# Patient Record
Sex: Male | Born: 1980 | Race: White | Hispanic: No | Marital: Married | Smoking: Current every day smoker
Health system: Southern US, Community
[De-identification: ages and names within clinical notes are randomized; demographics above are authoritative.]

## PROBLEM LIST (undated history)

## (undated) DIAGNOSIS — I2510 Atherosclerotic heart disease of native coronary artery without angina pectoris: Secondary | ICD-10-CM

## (undated) DIAGNOSIS — I4891 Unspecified atrial fibrillation: Secondary | ICD-10-CM

## (undated) DIAGNOSIS — I10 Essential (primary) hypertension: Secondary | ICD-10-CM

## (undated) DIAGNOSIS — Z720 Tobacco use: Secondary | ICD-10-CM

## (undated) DIAGNOSIS — R569 Unspecified convulsions: Secondary | ICD-10-CM

## (undated) HISTORY — PX: FOREARM SURGERY: SHX651

## (undated) HISTORY — DX: Atherosclerotic heart disease of native coronary artery without angina pectoris: I25.10

## (undated) HISTORY — PX: KNEE SURGERY: SHX244

## (undated) HISTORY — PX: EYE SURGERY: SHX253

## (undated) HISTORY — PX: CARDIAC SURGERY: SHX584

## (undated) HISTORY — PX: OTHER SURGICAL HISTORY: SHX169

---

## 2004-07-03 ENCOUNTER — Emergency Department: Payer: Self-pay | Admitting: Emergency Medicine

## 2004-08-11 ENCOUNTER — Emergency Department: Payer: Self-pay | Admitting: Emergency Medicine

## 2004-11-17 ENCOUNTER — Emergency Department: Payer: Self-pay | Admitting: Emergency Medicine

## 2004-12-19 ENCOUNTER — Emergency Department: Payer: Self-pay | Admitting: General Practice

## 2004-12-22 ENCOUNTER — Emergency Department: Payer: Self-pay | Admitting: Emergency Medicine

## 2004-12-29 ENCOUNTER — Emergency Department: Payer: Self-pay | Admitting: Emergency Medicine

## 2005-05-16 ENCOUNTER — Emergency Department: Payer: Self-pay | Admitting: Emergency Medicine

## 2005-05-17 ENCOUNTER — Other Ambulatory Visit: Payer: Self-pay

## 2006-10-23 ENCOUNTER — Other Ambulatory Visit: Payer: Self-pay

## 2006-10-23 ENCOUNTER — Emergency Department: Payer: Self-pay | Admitting: Emergency Medicine

## 2006-12-26 ENCOUNTER — Emergency Department: Payer: Self-pay | Admitting: Emergency Medicine

## 2007-03-19 ENCOUNTER — Emergency Department: Payer: Self-pay | Admitting: Emergency Medicine

## 2007-11-23 ENCOUNTER — Emergency Department: Payer: Self-pay | Admitting: Emergency Medicine

## 2008-03-26 ENCOUNTER — Emergency Department: Payer: Self-pay | Admitting: Emergency Medicine

## 2008-04-16 ENCOUNTER — Emergency Department: Payer: Self-pay | Admitting: Emergency Medicine

## 2008-11-01 ENCOUNTER — Emergency Department: Payer: Self-pay | Admitting: Emergency Medicine

## 2009-03-05 ENCOUNTER — Emergency Department: Payer: Self-pay | Admitting: Internal Medicine

## 2009-03-14 ENCOUNTER — Emergency Department: Payer: Self-pay | Admitting: Emergency Medicine

## 2009-04-26 ENCOUNTER — Emergency Department: Payer: Self-pay | Admitting: Emergency Medicine

## 2009-06-04 ENCOUNTER — Emergency Department: Payer: Self-pay | Admitting: Emergency Medicine

## 2009-07-18 ENCOUNTER — Emergency Department: Payer: Self-pay | Admitting: Emergency Medicine

## 2009-12-23 ENCOUNTER — Inpatient Hospital Stay: Payer: Self-pay | Admitting: Psychiatry

## 2010-03-20 ENCOUNTER — Emergency Department: Payer: Self-pay | Admitting: Internal Medicine

## 2010-03-30 ENCOUNTER — Emergency Department: Payer: Self-pay | Admitting: Emergency Medicine

## 2010-04-07 ENCOUNTER — Emergency Department: Payer: Self-pay | Admitting: Emergency Medicine

## 2010-05-08 ENCOUNTER — Emergency Department: Payer: Self-pay | Admitting: Emergency Medicine

## 2010-05-18 ENCOUNTER — Emergency Department: Payer: Self-pay | Admitting: Emergency Medicine

## 2010-07-05 DIAGNOSIS — F319 Bipolar disorder, unspecified: Secondary | ICD-10-CM | POA: Diagnosis present

## 2010-09-13 ENCOUNTER — Emergency Department: Payer: Self-pay | Admitting: Emergency Medicine

## 2010-09-19 ENCOUNTER — Emergency Department: Payer: Self-pay | Admitting: Emergency Medicine

## 2011-01-19 ENCOUNTER — Emergency Department: Payer: Self-pay | Admitting: Emergency Medicine

## 2011-02-11 ENCOUNTER — Emergency Department: Payer: Self-pay | Admitting: Emergency Medicine

## 2011-03-15 ENCOUNTER — Emergency Department: Payer: Self-pay | Admitting: Unknown Physician Specialty

## 2012-01-22 ENCOUNTER — Emergency Department: Payer: Self-pay | Admitting: Unknown Physician Specialty

## 2012-02-25 ENCOUNTER — Emergency Department: Payer: Self-pay | Admitting: Unknown Physician Specialty

## 2012-02-25 LAB — CBC
HCT: 47.9 % (ref 40.0–52.0)
MCH: 31.5 pg (ref 26.0–34.0)
MCHC: 33.9 g/dL (ref 32.0–36.0)
RDW: 13 % (ref 11.5–14.5)
WBC: 11.8 10*3/uL — ABNORMAL HIGH (ref 3.8–10.6)

## 2012-02-25 LAB — DRUG SCREEN, URINE
Amphetamines, Ur Screen: NEGATIVE (ref ?–1000)
Benzodiazepine, Ur Scrn: NEGATIVE (ref ?–200)
Cannabinoid 50 Ng, Ur ~~LOC~~: NEGATIVE (ref ?–50)
MDMA (Ecstasy)Ur Screen: NEGATIVE (ref ?–500)
Methadone, Ur Screen: NEGATIVE (ref ?–300)
Tricyclic, Ur Screen: NEGATIVE (ref ?–1000)

## 2012-02-25 LAB — COMPREHENSIVE METABOLIC PANEL
Albumin: 4.1 g/dL (ref 3.4–5.0)
Alkaline Phosphatase: 102 U/L (ref 50–136)
BUN: 9 mg/dL (ref 7–18)
Bilirubin,Total: 0.2 mg/dL (ref 0.2–1.0)
Calcium, Total: 8.7 mg/dL (ref 8.5–10.1)
Creatinine: 0.97 mg/dL (ref 0.60–1.30)
Glucose: 95 mg/dL (ref 65–99)
SGOT(AST): 30 U/L (ref 15–37)

## 2012-02-25 LAB — CK TOTAL AND CKMB (NOT AT ARMC)
CK, Total: 121 U/L (ref 35–232)
CK-MB: <0.5 ng/mL — ABNORMAL LOW (ref 0.5–3.6)

## 2012-02-25 LAB — URINALYSIS, COMPLETE
Bacteria: NONE SEEN
Bilirubin,UR: NEGATIVE
Blood: NEGATIVE
Glucose,UR: NEGATIVE mg/dL (ref 0–75)
Leukocyte Esterase: NEGATIVE
Ph: 7 (ref 4.5–8.0)
Protein: NEGATIVE
Specific Gravity: 1.001 (ref 1.003–1.030)
Squamous Epithelial: NONE SEEN

## 2012-02-25 LAB — ETHANOL
Ethanol %: <0.003 % (ref 0.000–0.080)
Ethanol: <3 mg/dL

## 2012-03-26 DIAGNOSIS — E785 Hyperlipidemia, unspecified: Secondary | ICD-10-CM | POA: Diagnosis present

## 2012-04-14 ENCOUNTER — Emergency Department: Payer: Self-pay | Admitting: Unknown Physician Specialty

## 2012-06-05 ENCOUNTER — Emergency Department: Payer: Self-pay | Admitting: Emergency Medicine

## 2012-08-15 ENCOUNTER — Emergency Department: Payer: Self-pay | Admitting: Emergency Medicine

## 2013-01-28 ENCOUNTER — Emergency Department: Payer: Self-pay | Admitting: Emergency Medicine

## 2013-01-28 LAB — URINALYSIS, COMPLETE
Bilirubin,UR: NEGATIVE
Ketone: NEGATIVE
Leukocyte Esterase: NEGATIVE
Nitrite: NEGATIVE
Protein: NEGATIVE
RBC,UR: NONE SEEN /HPF (ref 0–5)
Specific Gravity: 1.023 (ref 1.003–1.030)
WBC UR: <1 /HPF (ref 0–5)

## 2013-01-28 LAB — COMPREHENSIVE METABOLIC PANEL
BUN: 13 mg/dL (ref 7–18)
Calcium, Total: 8.9 mg/dL (ref 8.5–10.1)
Co2: 32 mmol/L (ref 21–32)
EGFR (African American): >60
EGFR (Non-African Amer.): >60
Glucose: 90 mg/dL (ref 65–99)
Potassium: 3.9 mmol/L (ref 3.5–5.1)
SGOT(AST): 27 U/L (ref 15–37)
SGPT (ALT): 37 U/L (ref 12–78)
Total Protein: 7.6 g/dL (ref 6.4–8.2)

## 2013-01-28 LAB — CBC
HGB: 16.1 g/dL (ref 13.0–18.0)
MCHC: 34.5 g/dL (ref 32.0–36.0)
MCV: 90 fL (ref 80–100)
Platelet: 181 10*3/uL (ref 150–440)
RBC: 5.16 10*6/uL (ref 4.40–5.90)
RDW: 13 % (ref 11.5–14.5)
WBC: 13.4 10*3/uL — ABNORMAL HIGH (ref 3.8–10.6)

## 2013-01-28 LAB — APTT: Activated PTT: 36.8 secs — ABNORMAL HIGH (ref 23.6–35.9)

## 2013-01-28 LAB — PROTIME-INR
INR: 0.9
Prothrombin Time: 12.3 secs (ref 11.5–14.7)

## 2013-08-31 ENCOUNTER — Emergency Department: Payer: Self-pay | Admitting: Emergency Medicine

## 2013-12-31 ENCOUNTER — Emergency Department: Payer: Self-pay | Admitting: Internal Medicine

## 2014-05-04 ENCOUNTER — Emergency Department: Payer: Self-pay | Admitting: Emergency Medicine

## 2014-09-07 ENCOUNTER — Emergency Department: Payer: Self-pay | Admitting: Emergency Medicine

## 2014-10-19 ENCOUNTER — Emergency Department: Payer: Self-pay | Admitting: Emergency Medicine

## 2015-06-21 DIAGNOSIS — F17200 Nicotine dependence, unspecified, uncomplicated: Secondary | ICD-10-CM | POA: Diagnosis present

## 2015-10-13 ENCOUNTER — Emergency Department: Payer: Self-pay

## 2015-10-13 ENCOUNTER — Emergency Department
Admission: EM | Admit: 2015-10-13 | Discharge: 2015-10-13 | Disposition: A | Discharge status: Home / Self Care | Payer: Self-pay | Attending: Emergency Medicine | Admitting: Emergency Medicine

## 2015-10-13 ENCOUNTER — Encounter: Payer: Self-pay | Admitting: Emergency Medicine

## 2015-10-13 DIAGNOSIS — F17200 Nicotine dependence, unspecified, uncomplicated: Secondary | ICD-10-CM | POA: Insufficient documentation

## 2015-10-13 DIAGNOSIS — M25562 Pain in left knee: Secondary | ICD-10-CM | POA: Insufficient documentation

## 2015-10-13 DIAGNOSIS — Z9889 Other specified postprocedural states: Secondary | ICD-10-CM | POA: Insufficient documentation

## 2015-10-13 DIAGNOSIS — I10 Essential (primary) hypertension: Secondary | ICD-10-CM | POA: Insufficient documentation

## 2015-10-13 HISTORY — DX: Essential (primary) hypertension: I10

## 2015-10-13 HISTORY — DX: Unspecified convulsions: R56.9

## 2015-10-13 MED ORDER — TRAMADOL HCL 50 MG PO TABS: 50.0000 mg | ORAL_TABLET | Freq: Four times a day (QID) | ORAL | Status: DC | PRN

## 2015-10-13 MED ORDER — TRAMADOL HCL 50 MG PO TABS
50.0000 mg | ORAL_TABLET | Freq: Once | ORAL | Status: AC
  Administered 2015-10-13: 50 mg via ORAL
  Filled 2015-10-13: qty 1

## 2015-10-13 MED ORDER — ACETAMINOPHEN 325 MG PO TABS
650.0000 mg | ORAL_TABLET | Freq: Once | ORAL | Status: AC
  Administered 2015-10-13: 650 mg via ORAL
  Filled 2015-10-13: qty 2

## 2015-10-13 NOTE — ED Provider Notes (Signed)
[facility] Emergency Department Provider Note  ____________________________________________  Time seen: Approximately 559 AM  I have reviewed the triage vital signs and the nursing notes.   HISTORY  Chief Complaint Knee Pain    HPI Jeremy Yoder is a 35 y.o. male who comes into the hospital today with left knee pain. The patient reports that the pain started 4 days ago. The patient does not recall any trauma but is unsure if he may have stepped off the porch wrong was playing with his kids. The patient is been propping up his knee but he has not iced it and he has not taken any medications for his knee. The patient reports that it still hurts and he rates pain 8 out of 10 in intensity. The patient does not have a physician a follow-up with. He reports that had been swollen previously but it is not swollen currently. He does have a history of surgery on that knee and reports this pain is in the left medial knee. The patient is here for evaluation and treatment of his symptoms.   Past Medical History  Diagnosis Date  . Hypertension   . Seizures (HCC)   . Hyperlipidemia     There are no active problems to display for this patient.   Past Surgical History  Procedure Laterality Date  . Arm    . Knee surgery    . Forearm surgery      Current Outpatient Rx  Name  Route  Sig  Dispense  Refill  . traMADol (ULTRAM) 50 MG tablet   Oral   Take 1 tablet (50 mg total) by mouth every 6 (six) hours as needed.   12 tablet   0     Allergies Aspirin and Ibuprofen  No family history on file.  Social History Social History  Substance Use Topics  . Smoking status: Current Every Day Smoker  . Smokeless tobacco: Not on file  . Alcohol Use: No    Review of Systems Constitutional: No fever/chills Eyes: No visual changes. ENT: No sore throat. Cardiovascular: Denies chest pain. Respiratory: Denies shortness of breath. Gastrointestinal: No abdominal pain.   No nausea, no vomiting.  No diarrhea.  No constipation. Genitourinary: Negative for dysuria. Musculoskeletal: Left Knee pain Skin: Negative for rash. Neurological: Negative for headaches, focal weakness or numbness.  10-point ROS otherwise negative.  ____________________________________________   PHYSICAL EXAM:  VITAL SIGNS: ED Triage Vitals  Enc Vitals Group     BP 10/13/15 0217 159/102 mmHg     Pulse Rate 10/13/15 0217 95     Resp 10/13/15 0217 18     Temp 10/13/15 0217 97.6 F (36.4 C)     Temp Source 10/13/15 0217 Oral     SpO2 10/13/15 0217 99 %     Weight 10/13/15 0217 185 lb (83.915 kg)     Height 10/13/15 0217 6' (1.829 m)     Head Cir --      Peak Flow --      Pain Score 10/13/15 0218 8     Pain Loc --      Pain Edu? --      Excl. in GC? --     Constitutional: Alert and oriented. Well appearing and in mild distress. Eyes: Conjunctivae are normal. PERRL. EOMI. Head: Atraumatic. Nose: No congestion/rhinnorhea. Mouth/Throat: Mucous membranes are moist.  Oropharynx non-erythematous. Cardiovascular: Normal rate, regular rhythm. Grossly normal heart sounds.  Good peripheral circulation. Respiratory: Normal respiratory effort.  No retractions.  Lungs CTAB. Gastrointestinal: Soft and nontender. No distention. Positive bowel sounds Musculoskeletal: No lower extremity tenderness nor edema. Mild tenderness to palpation of left medial knee with mild pain to flexion and extension. No significant effusion. Neurologic:  Normal speech and language.  Skin:  Skin is warm, dry and intact.  Psychiatric: Mood and affect are normal.   ____________________________________________   LABS (all labs ordered are listed, but only abnormal results are displayed)  Labs Reviewed - No data to display ____________________________________________  EKG  None ____________________________________________  RADIOLOGY  Left knee complete: No evidence for fracture or  dislocation ____________________________________________   PROCEDURES  Procedure(s) performed: None  Critical Care performed: No  ____________________________________________   INITIAL IMPRESSION / ASSESSMENT AND PLAN / ED COURSE  Pertinent labs & imaging results that were available during my care of the patient were reviewed by me and considered in my medical decision making (see chart for details).  The patient has some minimal tenderness to palpation. I informed him that he may have a soft tissue injury that we cannot see on x-ray. I will give him some Tylenol and tramadol as well as put him in a knee immobilizer and give him some crutches. I will have the patient follow up with orthopedic surgery for further evaluation of his knee pain. The patient was able to ambulate into the emergency department. I will encourage nonweightbearing with ice and rest as well as some pain medicine. The patient be discharged to follow-up. ____________________________________________   FINAL CLINICAL IMPRESSION(S) / ED DIAGNOSES  Final diagnoses:  Left knee pain      Rebecka Apley, MD 10/13/15 [phone]

## 2015-10-13 NOTE — ED Notes (Signed)
Pt in with co left knee pain hx of surgery to same in 2002 and has intermittent pain since.

## 2015-10-13 NOTE — Discharge Instructions (Signed)

## 2016-03-19 ENCOUNTER — Emergency Department: Payer: Self-pay

## 2016-03-19 ENCOUNTER — Emergency Department
Admission: EM | Admit: 2016-03-19 | Discharge: 2016-03-20 | Disposition: A | Discharge status: Home / Self Care | Payer: Self-pay | Attending: Emergency Medicine | Admitting: Emergency Medicine

## 2016-03-19 DIAGNOSIS — I10 Essential (primary) hypertension: Secondary | ICD-10-CM | POA: Insufficient documentation

## 2016-03-19 DIAGNOSIS — M545 Low back pain, unspecified: Secondary | ICD-10-CM

## 2016-03-19 DIAGNOSIS — F17200 Nicotine dependence, unspecified, uncomplicated: Secondary | ICD-10-CM | POA: Insufficient documentation

## 2016-03-19 DIAGNOSIS — J209 Acute bronchitis, unspecified: Secondary | ICD-10-CM | POA: Insufficient documentation

## 2016-03-19 LAB — CBC WITH DIFFERENTIAL/PLATELET
Basophils Absolute: 0.1 10*3/uL (ref 0–0.1)
Basophils Relative: 1 %
Eosinophils Absolute: 0.7 10*3/uL (ref 0–0.7)
Eosinophils Relative: 6 %
HEMATOCRIT: 49.8 % (ref 40.0–52.0)
HEMOGLOBIN: 17.3 g/dL (ref 13.0–18.0)
LYMPHS ABS: 4.1 10*3/uL — AB (ref 1.0–3.6)
LYMPHS PCT: 37 %
MCH: 31 pg (ref 26.0–34.0)
MCHC: 34.7 g/dL (ref 32.0–36.0)
MCV: 89.2 fL (ref 80.0–100.0)
MONOS PCT: 9 %
Monocytes Absolute: 1 10*3/uL (ref 0.2–1.0)
NEUTROS ABS: 5.3 10*3/uL (ref 1.4–6.5)
NEUTROS PCT: 47 %
Platelets: 181 10*3/uL (ref 150–440)
RBC: 5.59 MIL/uL (ref 4.40–5.90)
RDW: 13.1 % (ref 11.5–14.5)
WBC: 11.1 10*3/uL — AB (ref 3.8–10.6)

## 2016-03-19 LAB — COMPREHENSIVE METABOLIC PANEL WITH GFR
ALT: 23 U/L (ref 17–63)
AST: 26 U/L (ref 15–41)
Albumin: 4.5 g/dL (ref 3.5–5.0)
Alkaline Phosphatase: 72 U/L (ref 38–126)
Anion gap: 6 (ref 5–15)
BUN: 12 mg/dL (ref 6–20)
CO2: 27 mmol/L (ref 22–32)
Calcium: 9.2 mg/dL (ref 8.9–10.3)
Chloride: 106 mmol/L (ref 101–111)
Creatinine, Ser: 1.01 mg/dL (ref 0.61–1.24)
GFR calc Af Amer: >60 mL/min
GFR calc non Af Amer: >60 mL/min
Glucose, Bld: 92 mg/dL (ref 65–99)
Potassium: 3.7 mmol/L (ref 3.5–5.1)
Sodium: 139 mmol/L (ref 135–145)
Total Bilirubin: 0.5 mg/dL (ref 0.3–1.2)
Total Protein: 7.9 g/dL (ref 6.5–8.1)

## 2016-03-19 LAB — TROPONIN I: Troponin I: <0.03 ng/mL (ref <0.03)

## 2016-03-19 MED ORDER — HYDROCODONE-ACETAMINOPHEN 5-325 MG PO TABS
1.0000 | ORAL_TABLET | Freq: Once | ORAL | Status: AC
Start: 2016-03-19 — End: 2016-03-19
  Administered 2016-03-19: 1 via ORAL
  Filled 2016-03-19: qty 1

## 2016-03-19 MED ORDER — ALBUTEROL SULFATE (2.5 MG/3ML) 0.083% IN NEBU
5.0000 mg | INHALATION_SOLUTION | Freq: Once | RESPIRATORY_TRACT | Status: AC
  Administered 2016-03-19: 5 mg via RESPIRATORY_TRACT
  Filled 2016-03-19: qty 6

## 2016-03-19 NOTE — ED Triage Notes (Signed)
Pt ambulatory to triage with no difficulty. Pt reports he developed pain to his back last night and has been having shortness of breath when he walks. Pt states he has just not been feeling good.

## 2016-03-19 NOTE — ED Provider Notes (Signed)
Time Seen: Approximately 2317  I have reviewed the triage notes  Chief Complaint: Back Pain and Shortness of Breath   History of Present Illness: Jeremy Yoder is a 35 y.o. male who has a history significant for heavy tobacco usage and states he's had some difficulty breathing over the last couple of days with a occasional productive cough. He is not sure of the color is fine but he is not aware of any fever at home. Patient was found on triage evaluation have significant wheezing and was given albuterol Atrovent treatment which she states has helped. His second complaint is some lower back pain which is been going on for the last several days but significantly more last night. He has been able to ambulate describes normal urination and bowel movements. He states he has some pain when he lifts his left leg but is not noticed any pain radiating down the back of his leg. Taken anything at home for pain describe significant allergies to aspirin and ibuprofen.   Past Medical History:  Diagnosis Date  . Hyperlipidemia   . Hypertension   . Seizures (HCC)     There are no active problems to display for this patient.   Past Surgical History:  Procedure Laterality Date  . arm    . FOREARM SURGERY    . KNEE SURGERY      Past Surgical History:  Procedure Laterality Date  . arm    . FOREARM SURGERY    . KNEE SURGERY      Current Outpatient Rx  . Order #: [id number] Class: Print    Allergies:  Aspirin and Ibuprofen  Family History: No family history on file.  Social History: Social History  Substance Use Topics  . Smoking status: Current Every Day Smoker  . Smokeless tobacco: Not on file  . Alcohol use No     Review of Systems:   10 point review of systems was performed and was otherwise negative:  Constitutional: No fever Eyes: No visual disturbances ENT: No sore throat, ear pain Cardiac: No chest pain Respiratory: Shortness of breath mainly with ambulation.  Occasional cough Abdomen: No abdominal pain, no vomiting, No diarrhea Endocrine: No weight loss, No night sweats Extremities: No peripheral edema, cyanosis Skin: No rashes, easy bruising Neurologic: No focal weakness, trouble with speech or swollowing Urologic: No dysuria, Hematuria, or urinary frequency Outside smoking denies any recent pulmonary emboli risk factors  Physical Exam:  ED Triage Vitals  Enc Vitals Group     BP 03/19/16 2208 (!) 162/116     Pulse Rate 03/19/16 2208 (!) 111     Resp 03/19/16 2208 (!) 22     Temp 03/19/16 2208 98.2 F (36.8 C)     Temp Source 03/19/16 2208 Oral     SpO2 03/19/16 2208 94 %     Weight 03/19/16 2208 180 lb (81.6 kg)     Height 03/19/16 2208  (1.803 m)     Head Circumference --      Peak Flow --      Pain Score 03/19/16 2211 10     Pain Loc --      Pain Edu? --      Excl. in GC? --     General: Awake , Alert , and Oriented times 3; GCS 15 No signs of respiratory distress Head: Normal cephalic , atraumatic Eyes: Pupils equal , round, reactive to light Nose/Throat: No nasal drainage, patent upper airway without erythema or exudate.  Neck: Supple, Full range of motion, No anterior adenopathy or palpable thyroid masses Lungs: Clear to ascultation without wheezes , mild rhonchi at the left apices without rales Heart: Regular rate, regular rhythm without murmurs , gallops , or rubs Abdomen: Soft, non tender without rebound, guarding , or rigidity; bowel sounds positive and symmetric in all 4 quadrants. No organomegaly .        Extremities: 2 plus symmetric pulses. No edema, clubbing or cyanosis. No calf tenderness or swelling Neurologic: normal ambulation, Motor symmetric without deficits, sensory intact Skin: warm, dry, no rashes   Labs:   All laboratory work was reviewed including any pertinent negatives or positives listed below:  Labs Reviewed  CBC WITH DIFFERENTIAL/PLATELET  COMPREHENSIVE METABOLIC PANEL  TROPONIN I   FIBRIN DERIVATIVES D-DIMER ([facility] ONLY)    EKG:  ED ECG REPORT I, Jennye MoccasinBrian S Zalmen Wrightsman, the attending physician, personally viewed and interpreted this ECG.  Date: 03/19/2016 EKG Time: 2215 Rate: *78 Rhythm: normal sinus rhythm QRS Axis: normal Intervals: normal ST/T Wave abnormalities: normal Conduction Disturbances: none Narrative Interpretation: unremarkable No acute ischemic changes   Radiology: *  EXAM: CHEST  2 VIEW  COMPARISON:  Radiograph 09/07/2014  FINDINGS: The cardiomediastinal contours are normal. Mild bronchial thickening. Pulmonary vasculature is normal. No consolidation, pleural effusion, or pneumothorax. No acute osseous abnormalities are seen.  IMPRESSION: Mild bronchial thickening may be smoking related lung disease or acute bronchitis. Otherwise no acute abnormality.    ED Course: * Patient's stay here was uneventful. He had a single breathing treatment which cleared most of his wheezing and repeat exam only shows some mild rhonchi in the left upper lobe. Patient's not hypoxic. D-dimer test was negative and I felt further investigation of life-threatening causes for shortness of breath such as pulmonary embolism, congestive heart failure, cardiac tamponade not etc. was not necessary. He has an extensive smoking history and is most likely is acute bronchitis with bronchospasm. Patient's low back pain does not appear to be cauda equina syndrome.   Clinical Course     Assessment:  Acute bronchitis with bronchospasm Musculoskeletal low back pain     Plan:  Outpatient Patient was advised to return immediately if condition worsens. Patient was advised to follow up with their primary care physician or other specialized physicians involved in their outpatient care. The patient and/or family member/power of attorney had laboratory results reviewed at the bedside. All questions and concerns were addressed and appropriate discharge instructions were  distributed by the nursing staff.             Jennye MoccasinBrian S Essex Perry, MD 03/20/16 [id number]

## 2016-03-20 LAB — FIBRIN DERIVATIVES D-DIMER (ARMC ONLY): FIBRIN DERIVATIVES D-DIMER (ARMC): 266 (ref 0–499)

## 2016-03-20 MED ORDER — AZITHROMYCIN 250 MG PO TABS: ORAL_TABLET | ORAL | 0 refills | Status: DC

## 2016-03-20 MED ORDER — ALBUTEROL SULFATE HFA 108 (90 BASE) MCG/ACT IN AERS: 2.0000 | INHALATION_SPRAY | Freq: Four times a day (QID) | RESPIRATORY_TRACT | 2 refills | Status: DC | PRN

## 2016-03-20 MED ORDER — TRAMADOL HCL 50 MG PO TABS: 50.0000 mg | ORAL_TABLET | Freq: Four times a day (QID) | ORAL | 0 refills | Status: AC | PRN

## 2016-03-20 MED ORDER — LISINOPRIL 10 MG PO TABS: 10.0000 mg | ORAL_TABLET | Freq: Every day | ORAL | 0 refills | Status: DC

## 2016-07-13 ENCOUNTER — Encounter: Payer: Self-pay | Admitting: Emergency Medicine

## 2016-07-13 ENCOUNTER — Emergency Department
Admission: EM | Admit: 2016-07-13 | Discharge: 2016-07-13 | Disposition: A | Discharge status: Home / Self Care | Payer: Self-pay | Attending: Emergency Medicine | Admitting: Emergency Medicine

## 2016-07-13 DIAGNOSIS — R591 Generalized enlarged lymph nodes: Secondary | ICD-10-CM | POA: Insufficient documentation

## 2016-07-13 DIAGNOSIS — I10 Essential (primary) hypertension: Secondary | ICD-10-CM | POA: Insufficient documentation

## 2016-07-13 DIAGNOSIS — K0889 Other specified disorders of teeth and supporting structures: Secondary | ICD-10-CM | POA: Insufficient documentation

## 2016-07-13 DIAGNOSIS — J069 Acute upper respiratory infection, unspecified: Secondary | ICD-10-CM | POA: Insufficient documentation

## 2016-07-13 DIAGNOSIS — F17200 Nicotine dependence, unspecified, uncomplicated: Secondary | ICD-10-CM | POA: Insufficient documentation

## 2016-07-13 LAB — POCT RAPID STREP A: Streptococcus, Group A Screen (Direct): NEGATIVE

## 2016-07-13 MED ORDER — HYDROCOD POLST-CPM POLST ER 10-8 MG/5ML PO SUER
5.0000 mL | Freq: Once | ORAL | Status: AC
  Administered 2016-07-13: 5 mL via ORAL

## 2016-07-13 MED ORDER — PREDNISONE 20 MG PO TABS
60.0000 mg | ORAL_TABLET | Freq: Once | ORAL | Status: AC
  Administered 2016-07-13: 60 mg via ORAL

## 2016-07-13 MED ORDER — HYDROCOD POLST-CPM POLST ER 10-8 MG/5ML PO SUER
ORAL | Status: AC
  Administered 2016-07-13: 5 mL via ORAL
  Filled 2016-07-13: qty 5

## 2016-07-13 MED ORDER — PREDNISONE 20 MG PO TABS
ORAL_TABLET | ORAL | Status: AC
  Administered 2016-07-13: 60 mg via ORAL
  Filled 2016-07-13: qty 3

## 2016-07-13 MED ORDER — PREDNISONE 10 MG (21) PO TBPK: ORAL_TABLET | ORAL | 0 refills | Status: DC

## 2016-07-13 MED ORDER — HYDROCOD POLST-CPM POLST ER 10-8 MG/5ML PO SUER: 5.0000 mL | Freq: Two times a day (BID) | ORAL | 0 refills | Status: DC

## 2016-07-13 NOTE — ED Triage Notes (Signed)
Pt with right side gland swelling started yesterday with a cough.

## 2016-07-13 NOTE — ED Notes (Signed)
Discharge instructions reviewed with patient. Questions fielded by this RN. Patient verbalizes understanding of instructions. Patient discharged home in stable condition per Jeremy Yoder . Pt educated on the needto follow up with PCP in regards to BP. No acute distress noted at time of discharge.

## 2016-07-13 NOTE — ED Provider Notes (Signed)
[facility] Emergency Department Provider Note        Time seen: ----------------------------------------- 7:56 PM on 07/13/2016 -----------------------------------------    I have reviewed the triage vital signs and the nursing notes.   HISTORY  Chief Complaint Lymphadenopathy    HPI Jeremy Yoder is a 35 y.o. male who presents to ER for right-sided neck swelling that started yesterday. Patient states also had a cough with some congestion but tickly has sore throat and difficulty swallowing. Patient states he could not eat lunch today due to the pain. Pain is 8 out of 10 and sharp. Patient also reports toothache in the right upper jaw.   Past Medical History:  Diagnosis Date  . Hyperlipidemia   . Hypertension   . Seizures (HCC)     There are no active problems to display for this patient.   Past Surgical History:  Procedure Laterality Date  . arm    . FOREARM SURGERY    . KNEE SURGERY      Allergies Aspirin and Ibuprofen  Social History Social History  Substance Use Topics  . Smoking status: Current Every Day Smoker  . Smokeless tobacco: Never Used  . Alcohol use No    Review of Systems Constitutional: Negative for fever. ENT: Positive for toothache, sore throat, neck swelling Cardiovascular: Negative for chest pain. Respiratory: Negative for shortness of breath. Positive for cough Gastrointestinal: Negative for abdominal pain, vomiting and diarrhea. Skin: Negative for rash. Neurological: Negative for headaches, focal weakness or numbness.  10-point ROS otherwise negative.  ____________________________________________   PHYSICAL EXAM:  VITAL SIGNS: ED Triage Vitals  Enc Vitals Group     BP 07/13/16 1819 (!) 159/110     Pulse Rate 07/13/16 1819 (!) 103     Resp 07/13/16 1819 20     Temp 07/13/16 1819 97.9 F (36.6 C)     Temp Source 07/13/16 1819 Oral     SpO2 07/13/16 1819 98 %     Weight 07/13/16 1819 180 lb  (81.6 kg)     Height 07/13/16 1819 6' (1.829 m)     Head Circumference --      Peak Flow --      Pain Score 07/13/16 1820 8     Pain Loc --      Pain Edu? --      Excl. in GC? --     Constitutional: Alert and oriented. Well appearing and in no distress. Eyes: Conjunctivae are normal. PERRL. Normal extraocular movements. ENT   Head: Normocephalic and atraumatic.   Nose: No congestion/rhinnorhea.   Mouth/Throat: Mucous membranes are moist.Posterior pharyngeal erythema, tonsils appear normal   Neck: No stridor.Moderate right anterior cervical adenopathy, mild in the left Cardiovascular: Normal rate, regular rhythm. No murmurs, rubs, or gallops. Respiratory: Normal respiratory effort without tachypnea nor retractions. Breath sounds are clear and equal bilaterally. No wheezes/rales/rhonchi. Musculoskeletal: Nontender with normal range of motion in all extremities. No lower extremity tenderness nor edema. Neurologic:  Normal speech and language. No gross focal neurologic deficits are appreciated.  Skin:  Skin is warm, dry and intact. No rash noted. ____________________________________________  ED COURSE:  Pertinent labs & imaging results that were available during my care of the patient were reviewed by me and considered in my medical decision making (see chart for details). Clinical Course   Patient presents to the ER with likely reactive adenopathy. We will obtain strep testing  Procedures ____________________________________________   LABS (pertinent positives/negatives)  Labs Reviewed  POCT  RAPID STREP A   ____________________________________________  FINAL ASSESSMENT AND PLAN  URI, lymphadenopathy  Plan: Patient with labs as dictated above. Patient is in no distress, he'll be given a steroid taper as well as medications for cough and congestion. He'll be referred to ENT if his symptoms do not improve.   Jeremy Yoder, Jeremy Buczek E, MD   Note: This dictation was  prepared with Dragon dictation. Any transcriptional errors that result from this process are unintentional    Jeremy FilbertJonathan Yoder Kerstin Crusoe, MD 07/13/16 1958

## 2016-09-27 ENCOUNTER — Encounter: Payer: Self-pay | Admitting: Emergency Medicine

## 2016-09-27 ENCOUNTER — Emergency Department
Admission: EM | Admit: 2016-09-27 | Discharge: 2016-09-27 | Disposition: A | Discharge status: Home / Self Care | Payer: Self-pay | Attending: Emergency Medicine | Admitting: Emergency Medicine

## 2016-09-27 DIAGNOSIS — Z5321 Procedure and treatment not carried out due to patient leaving prior to being seen by health care provider: Secondary | ICD-10-CM | POA: Insufficient documentation

## 2016-09-27 DIAGNOSIS — F17200 Nicotine dependence, unspecified, uncomplicated: Secondary | ICD-10-CM | POA: Insufficient documentation

## 2016-09-27 DIAGNOSIS — I10 Essential (primary) hypertension: Secondary | ICD-10-CM | POA: Insufficient documentation

## 2016-09-27 DIAGNOSIS — Z79899 Other long term (current) drug therapy: Secondary | ICD-10-CM | POA: Insufficient documentation

## 2016-09-27 DIAGNOSIS — R05 Cough: Secondary | ICD-10-CM | POA: Insufficient documentation

## 2016-09-27 NOTE — ED Triage Notes (Signed)
Patient ambulatory to triage with steady gait, without difficulty or distress noted; pt reports x 2-3 days having prod cough white sputum, sinus congestion

## 2016-10-23 ENCOUNTER — Emergency Department
Admission: EM | Admit: 2016-10-23 | Discharge: 2016-10-23 | Disposition: A | Discharge status: Home / Self Care | Payer: Self-pay | Attending: Emergency Medicine | Admitting: Emergency Medicine

## 2016-10-23 ENCOUNTER — Encounter: Payer: Self-pay | Admitting: Emergency Medicine

## 2016-10-23 DIAGNOSIS — I10 Essential (primary) hypertension: Secondary | ICD-10-CM | POA: Insufficient documentation

## 2016-10-23 DIAGNOSIS — F17200 Nicotine dependence, unspecified, uncomplicated: Secondary | ICD-10-CM | POA: Insufficient documentation

## 2016-10-23 DIAGNOSIS — K1120 Sialoadenitis, unspecified: Secondary | ICD-10-CM | POA: Insufficient documentation

## 2016-10-23 DIAGNOSIS — K029 Dental caries, unspecified: Secondary | ICD-10-CM | POA: Insufficient documentation

## 2016-10-23 MED ORDER — AMOXICILLIN 500 MG PO CAPS
1000.0000 mg | ORAL_CAPSULE | Freq: Three times a day (TID) | ORAL | Status: DC
  Administered 2016-10-23: 1000 mg via ORAL
  Filled 2016-10-23: qty 2

## 2016-10-23 MED ORDER — AMOXICILLIN 875 MG PO TABS: 875.0000 mg | ORAL_TABLET | Freq: Two times a day (BID) | ORAL | 0 refills | Status: DC

## 2016-10-23 MED ORDER — MAGIC MOUTHWASH W/LIDOCAINE: 5.0000 mL | Freq: Four times a day (QID) | ORAL | 0 refills | Status: DC

## 2016-10-23 NOTE — ED Notes (Signed)
Prescription medication coverage resource material provided to patient.

## 2016-10-23 NOTE — ED Triage Notes (Signed)
Patient to ER for c/o right sided dental pain with swollen right lymph node.

## 2016-10-23 NOTE — ED Provider Notes (Signed)
[facility] Emergency Department Provider Note  ____________________________________________  Time seen: Approximately 8:48 PM  I have reviewed the triage vital signs and the nursing notes.   HISTORY  Chief Complaint Dental Pain  .  HPI Jeremy Yoder is a 36 y.o. male who presents to emergency department complaining of right lower dental pain and swollen lymph node. Patient reports that over the past 2-3 days he has had increasing right lower dental pain. He reports a swollen lymph node in the right submandibular region. Patient denies any fevers or chills, difficulty breathing or swallowing, abdominal pain, nausea or vomiting. No other complaints at this time. No medications prior to arrival.   Past Medical History:  Diagnosis Date  . Hyperlipidemia   . Hypertension   . Seizures (HCC)     There are no active problems to display for this patient.   Past Surgical History:  Procedure Laterality Date  . arm    . FOREARM SURGERY    . KNEE SURGERY      Prior to Admission medications   Medication Sig Start Date End Date Taking? Authorizing Provider  albuterol (PROVENTIL HFA;VENTOLIN HFA) 108 (90 Base) MCG/ACT inhaler Inhale 2 puffs into the lungs every 6 (six) hours as needed for wheezing or shortness of breath. 03/20/16   Jennye Moccasin, MD  amoxicillin (AMOXIL) 875 MG tablet Take 1 tablet (875 mg total) by mouth 2 (two) times daily. 10/23/16   Delorise Royals Cuthriell, PA-C  azithromycin (ZITHROMAX Z-PAK) 250 MG tablet Take as directed 03/20/16   Jennye Moccasin, MD  chlorpheniramine-HYDROcodone [facility] PENNKINETIC ER) 10-8 MG/5ML SUER Take 5 mLs by mouth 2 (two) times daily. 07/13/16   Emily Filbert, MD  lisinopril (PRINIVIL,ZESTRIL) 10 MG tablet Take 1 tablet (10 mg total) by mouth daily. 03/20/16 04/19/16  Jennye Moccasin, MD  magic mouthwash w/lidocaine SOLN Take 5 mLs by mouth 4 (four) times daily. 10/23/16   Delorise Royals Cuthriell, PA-C  predniSONE  (STERAPRED UNI-PAK 21 TAB) 10 MG (21) TBPK tablet Dispense steroid taper pack as directed 07/13/16   Emily Filbert, MD  traMADol (ULTRAM) 50 MG tablet Take 1 tablet (50 mg total) by mouth every 6 (six) hours as needed. 03/20/16 03/20/17  Jennye Moccasin, MD    Allergies Aspirin and Ibuprofen  No family history on file.  Social History Social History  Substance Use Topics  . Smoking status: Current Every Day Smoker  . Smokeless tobacco: Never Used  . Alcohol use No     Review of Systems  Constitutional: No fever/chills Eyes: No visual changes. No discharge ENT: Positive for right lower dental pain Cardiovascular: no chest pain. Respiratory: no cough. No SOB. Gastrointestinal: No abdominal pain.  No nausea, no vomiting.  No diarrhea.  No constipation. Musculoskeletal: Negative for musculoskeletal pain. Skin: Negative for rash, abrasions, lacerations, ecchymosis. Neurological: Negative for headaches, focal weakness or numbness. 10-point ROS otherwise negative.  ____________________________________________   PHYSICAL EXAM:  VITAL SIGNS: ED Triage Vitals  Enc Vitals Group     BP 10/23/16 2012 (!) 189/113     Pulse Rate 10/23/16 2012 85     Resp 10/23/16 2012 18     Temp 10/23/16 2012 98.3 F (36.8 C)     Temp Source 10/23/16 2012 Oral     SpO2 10/23/16 2012 96 %     Weight 10/23/16 2013 182 lb 14.4 oz (83 kg)     Height 10/23/16 2013 6' (1.829 m)  Head Circumference --      Peak Flow --      Pain Score 10/23/16 2013 9     Pain Loc --      Pain Edu? --      Excl. in GC? --      Constitutional: Alert and oriented. Well appearing and in no acute distress. Eyes: Conjunctivae are normal. PERRL. EOMI. Head: Atraumatic. ENT:      Ears:       Nose: No congestion/rhinnorhea.      Mouth/Throat: Mucous membranes are moist. Patient does have multiple caries noted throughout dentition. No significant indication of infection with erythema or edema along dentition.  Patient does have erythema and mild edema in the region of salivary gland right sublingual region. Palpation along submandibular region reveals firmness consistent with sialoadenitis. Neck: No stridor.   Hematological/Lymphatic/Immunilogical: No cervical lymphadenopathy Cardiovascular: Normal rate, regular rhythm. Normal S1 and S2.  Good peripheral circulation. Respiratory: Normal respiratory effort without tachypnea or retractions. Lungs CTAB. Good air entry to the bases with no decreased or absent breath sounds. Musculoskeletal: Full range of motion to all extremities. No gross deformities appreciated. Neurologic:  Normal speech and language. No gross focal neurologic deficits are appreciated.  Skin:  Skin is warm, dry and intact. No rash noted. Psychiatric: Mood and affect are normal. Speech and behavior are normal. Patient exhibits appropriate insight and judgement.   ____________________________________________   LABS (all labs ordered are listed, but only abnormal results are displayed)  Labs Reviewed - No data to display ____________________________________________  EKG   ____________________________________________  RADIOLOGY   No results found.  ____________________________________________    PROCEDURES  Procedure(s) performed:    Procedures    Medications  amoxicillin (AMOXIL) capsule 1,000 mg (not administered)     ____________________________________________   INITIAL IMPRESSION / ASSESSMENT AND PLAN / ED COURSE  Pertinent labs & imaging results that were available during my care of the patient were reviewed by me and considered in my medical decision making (see chart for details).  Review of the [place] CSRS was performed in accordance of the [facility] prior to dispensing any controlled drugs.     Patient's diagnosis is consistent with sialoadenitis.. Patient will be discharged home with prescriptions for an biotic's Magic mouthwash. He is encouraged to  use heart/sour candy to increase saliva production.. Patient is to follow up with primary care as needed or otherwise directed. Patient is given ED precautions to return to the ED for any worsening or new symptoms.     ____________________________________________  FINAL CLINICAL IMPRESSION(S) / ED DIAGNOSES  Final diagnoses:  Sialadenitis      NEW MEDICATIONS STARTED DURING THIS VISIT:  New Prescriptions   AMOXICILLIN (AMOXIL) 875 MG TABLET    Take 1 tablet (875 mg total) by mouth 2 (two) times daily.   MAGIC MOUTHWASH W/LIDOCAINE SOLN    Take 5 mLs by mouth 4 (four) times daily.        This chart was dictated using voice recognition software/Dragon. Despite best efforts to proofread, errors can occur which can change the meaning. Any change was purely unintentional.    Racheal PatchesJonathan D Cuthriell, PA-C 10/23/16 2106    Myrna Blazeravid Matthew Schaevitz, MD 10/23/16 2340

## 2017-05-06 ENCOUNTER — Emergency Department
Admission: EM | Admit: 2017-05-06 | Discharge: 2017-05-06 | Disposition: A | Discharge status: Home / Self Care | Payer: Self-pay | Attending: Emergency Medicine | Admitting: Emergency Medicine

## 2017-05-06 ENCOUNTER — Encounter: Payer: Self-pay | Admitting: Emergency Medicine

## 2017-05-06 DIAGNOSIS — Z79899 Other long term (current) drug therapy: Secondary | ICD-10-CM | POA: Insufficient documentation

## 2017-05-06 DIAGNOSIS — K029 Dental caries, unspecified: Secondary | ICD-10-CM | POA: Insufficient documentation

## 2017-05-06 DIAGNOSIS — I10 Essential (primary) hypertension: Secondary | ICD-10-CM | POA: Insufficient documentation

## 2017-05-06 DIAGNOSIS — F17200 Nicotine dependence, unspecified, uncomplicated: Secondary | ICD-10-CM | POA: Insufficient documentation

## 2017-05-06 MED ORDER — LIDOCAINE VISCOUS 2 % MT SOLN
15.0000 mL | Freq: Once | OROMUCOSAL | Status: AC
  Administered 2017-05-06: 15 mL via OROMUCOSAL
  Filled 2017-05-06: qty 15

## 2017-05-06 MED ORDER — KETOROLAC TROMETHAMINE 10 MG PO TABS: 10.0000 mg | ORAL_TABLET | Freq: Four times a day (QID) | ORAL | 0 refills | Status: DC | PRN

## 2017-05-06 MED ORDER — AMOXICILLIN-POT CLAVULANATE 875-125 MG PO TABS
1.0000 | ORAL_TABLET | Freq: Once | ORAL | Status: AC
  Administered 2017-05-06: 1 via ORAL
  Filled 2017-05-06: qty 1

## 2017-05-06 MED ORDER — TRAMADOL HCL 50 MG PO TABS
50.0000 mg | ORAL_TABLET | Freq: Once | ORAL | Status: AC
  Administered 2017-05-06: 50 mg via ORAL
  Filled 2017-05-06: qty 1

## 2017-05-06 MED ORDER — LIDOCAINE VISCOUS 2 % MT SOLN: 20.0000 mL | OROMUCOSAL | 0 refills | Status: DC | PRN

## 2017-05-06 MED ORDER — AMOXICILLIN-POT CLAVULANATE 875-125 MG PO TABS: 1.0000 | ORAL_TABLET | Freq: Two times a day (BID) | ORAL | 0 refills | Status: AC

## 2017-05-06 MED ORDER — KETOROLAC TROMETHAMINE 10 MG PO TABS: 10.0000 mg | ORAL_TABLET | Freq: Once | ORAL | Status: DC

## 2017-05-06 NOTE — ED Provider Notes (Signed)
[facility] Emergency Department Provider Note    First MD Initiated Contact with Patient 05/06/17 [phone]     (approximate)  I have reviewed the triage vital signs and the nursing notes.   HISTORY  Chief Complaint Dental Pain   HPI Jeremy Yoder is a 36 y.o. male with below list of chronic medical conditions presents to the emergency department 3 day history of dental pain left upper and lower molar. Patient denies any fever no difficulty swallowing.   Past Medical History:  Diagnosis Date  . Hyperlipidemia   . Hypertension   . Seizures (HCC)     There are no active problems to display for this patient.   Past Surgical History:  Procedure Laterality Date  . arm    . FOREARM SURGERY    . KNEE SURGERY      Prior to Admission medications   Medication Sig Start Date End Date Taking? Authorizing Provider  albuterol (PROVENTIL HFA;VENTOLIN HFA) 108 (90 Base) MCG/ACT inhaler Inhale 2 puffs into the lungs every 6 (six) hours as needed for wheezing or shortness of breath. 03/20/16   Jennye Moccasin, MD  amoxicillin (AMOXIL) 875 MG tablet Take 1 tablet (875 mg total) by mouth 2 (two) times daily. 10/23/16   Cuthriell, Delorise Royals, PA-C  azithromycin (ZITHROMAX Z-PAK) 250 MG tablet Take as directed 03/20/16   Jennye Moccasin, MD  chlorpheniramine-HYDROcodone [facility] PENNKINETIC ER) 10-8 MG/5ML SUER Take 5 mLs by mouth 2 (two) times daily. 07/13/16   Emily Filbert, MD  lisinopril (PRINIVIL,ZESTRIL) 10 MG tablet Take 1 tablet (10 mg total) by mouth daily. 03/20/16 04/19/16  Jennye Moccasin, MD  magic mouthwash w/lidocaine SOLN Take 5 mLs by mouth 4 (four) times daily. 10/23/16   Cuthriell, Delorise Royals, PA-C  predniSONE (STERAPRED UNI-PAK 21 TAB) 10 MG (21) TBPK tablet Dispense steroid taper pack as directed 07/13/16   Emily Filbert, MD    Allergies Aspirin and Ibuprofen  No family history on file.  Social History Social History  Substance Use  Topics  . Smoking status: Current Every Day Smoker  . Smokeless tobacco: Never Used  . Alcohol use No    Review of Systems Constitutional: No fever/chills Eyes: No visual changes. ENT: No sore throat.positive for multiple dental caries Cardiovascular: Denies chest pain. Respiratory: Denies shortness of breath. Gastrointestinal: No abdominal pain.  No nausea, no vomiting.  No diarrhea.  No constipation. Genitourinary: Negative for dysuria. Musculoskeletal: Negative for neck pain.  Negative for back pain. Integumentary: Negative for rash. Neurological: Negative for headaches, focal weakness or numbness.  ____________________________________________   PHYSICAL EXAM:  VITAL SIGNS: ED Triage Vitals  Enc Vitals Group     BP 05/06/17 0105 (!) 161/101     Pulse Rate 05/06/17 0105 72     Resp 05/06/17 0105 18     Temp 05/06/17 0105 98.4 F (36.9 C)     Temp Source 05/06/17 0105 Oral     SpO2 05/06/17 0105 99 %     Weight 05/06/17 0105 82.4 kg (181 lb 10.5 oz)     Height 05/06/17 0047 1.829 m (6')     Head Circumference --      Peak Flow --      Pain Score 05/06/17 0047 8     Pain Loc --      Pain Edu? --      Excl. in GC? --     Constitutional: Alert and oriented. Well appearing and in  no acute distress. Eyes: Conjunctivae are normal.  Head: Atraumatic. Mouth/Throat: Mucous membranes are moist.  Oropharynx non-erythematous.multiple dental caries noted diffusely throughout the patient's mouth Neck: No stridor.   Respiratory: Normal respiratory effort.  No retractions. Lungs CTAB. Neurologic:  Normal speech and language. No gross focal neurologic deficits are appreciated.  Skin:  Skin is warm, dry and intact. No rash noted. Psychiatric: Mood and affect are normal. Speech and behavior are normal.  ____________________________________________   Procedures   ____________________________________________   INITIAL IMPRESSION / ASSESSMENT AND PLAN / ED COURSE  Pertinent  labs & imaging results that were available during my care of the patient were reviewed by me and considered in my medical decision making (see chart for details).   36 year old male presenting with above stated history of dental pain with multiple dental caries noted on exam. Patient will be given Toradol and Augmentin with referrals to dentists.     ____________________________________________  FINAL CLINICAL IMPRESSION(S) / ED DIAGNOSES  Final diagnoses:  Dental caries     MEDICATIONS GIVEN DURING THIS VISIT:  Medications  lidocaine (XYLOCAINE) 2 % viscous mouth solution 15 mL (not administered)  traMADol (ULTRAM) tablet 50 mg (not administered)  amoxicillin-clavulanate (AUGMENTIN) 875-125 MG per tablet 1 tablet (not administered)     NEW OUTPATIENT MEDICATIONS STARTED DURING THIS VISIT:  New Prescriptions   No medications on file    Modified Medications   No medications on file    Discontinued Medications   No medications on file     Note:  This document was prepared using Dragon voice recognition software and may include unintentional dictation errors.    Darci Current, MD 05/06/17 [phone]

## 2017-05-06 NOTE — ED Notes (Signed)
Reviewed d/c instructions, follow-up care, prescriptions with patient. Pt verbalized understanding.  

## 2017-05-06 NOTE — ED Triage Notes (Signed)
Patient with complaint of left side dental pain times 2-3 days.

## 2017-08-24 ENCOUNTER — Emergency Department
Admission: EM | Admit: 2017-08-24 | Discharge: 2017-08-24 | Disposition: A | Discharge status: Home / Self Care | Payer: Self-pay | Attending: Emergency Medicine | Admitting: Emergency Medicine

## 2017-08-24 DIAGNOSIS — K029 Dental caries, unspecified: Secondary | ICD-10-CM | POA: Insufficient documentation

## 2017-08-24 DIAGNOSIS — F17210 Nicotine dependence, cigarettes, uncomplicated: Secondary | ICD-10-CM | POA: Insufficient documentation

## 2017-08-24 DIAGNOSIS — I10 Essential (primary) hypertension: Secondary | ICD-10-CM | POA: Insufficient documentation

## 2017-08-24 DIAGNOSIS — K0889 Other specified disorders of teeth and supporting structures: Secondary | ICD-10-CM

## 2017-08-24 MED ORDER — TRAMADOL HCL 50 MG PO TABS: 50.0000 mg | ORAL_TABLET | Freq: Four times a day (QID) | ORAL | 0 refills | Status: DC | PRN

## 2017-08-24 MED ORDER — AMOXICILLIN 500 MG PO CAPS
500.0000 mg | ORAL_CAPSULE | Freq: Once | ORAL | Status: AC
  Administered 2017-08-24: 500 mg via ORAL
  Filled 2017-08-24: qty 1

## 2017-08-24 MED ORDER — AMOXICILLIN 875 MG PO TABS: 875.0000 mg | ORAL_TABLET | Freq: Two times a day (BID) | ORAL | 0 refills | Status: AC

## 2017-08-24 MED ORDER — TRAMADOL HCL 50 MG PO TABS
50.0000 mg | ORAL_TABLET | Freq: Once | ORAL | Status: DC
  Filled 2017-08-24: qty 1

## 2017-08-24 NOTE — ED Triage Notes (Signed)
Patient c/o left upper and lower dental pain. 

## 2017-08-24 NOTE — ED Notes (Signed)
Reviewed discharge instructions, follow-up care, and prescriptions with patient. Patient verbalized understanding of all information reviewed. Patient stable, with no distress noted at this time.    

## 2017-08-24 NOTE — ED Provider Notes (Signed)
[facility] EMERGENCY DEPARTMENT Provider Note   CSN: [id number] Arrival date & time: 08/24/17  2056     History   Chief Complaint Chief Complaint  Patient presents with  . Dental Pain    HPI Jeremy Yoder is a 37 y.o. male presents to the emergency department for evaluation of dental pain.  Patient has had left upper and lower times 1 week.  No fevers, oral drainage, difficulty swallowing, trismus, facial swelling.  Pain is moderate along the left back upper 2 teeth in the left lower bottom 3 teeth.  Is having some left-sided facial pain.  No headache.  Has a history of dental infections.  HPI  Past Medical History:  Diagnosis Date  . Hyperlipidemia   . Hypertension   . Seizures (HCC)     There are no active problems to display for this patient.   Past Surgical History:  Procedure Laterality Date  . arm    . FOREARM SURGERY    . KNEE SURGERY         Home Medications    Prior to Admission medications   Medication Sig Start Date End Date Taking? Authorizing Provider  albuterol (PROVENTIL HFA;VENTOLIN HFA) 108 (90 Base) MCG/ACT inhaler Inhale 2 puffs into the lungs every 6 (six) hours as needed for wheezing or shortness of breath. 03/20/16   Jennye Moccasin, MD  amoxicillin (AMOXIL) 875 MG tablet Take 1 tablet (875 mg total) by mouth 2 (two) times daily for 7 days. X 10 days 08/24/17 08/31/17  Evon Slack, PA-C  azithromycin (ZITHROMAX Z-PAK) 250 MG tablet Take as directed 03/20/16   Jennye Moccasin, MD  chlorpheniramine-HYDROcodone [facility] PENNKINETIC ER) 10-8 MG/5ML SUER Take 5 mLs by mouth 2 (two) times daily. 07/13/16   Emily Filbert, MD  lidocaine (XYLOCAINE) 2 % solution Use as directed 20 mLs in the mouth or throat as needed for mouth pain. 05/06/17   Darci Current, MD  lisinopril (PRINIVIL,ZESTRIL) 10 MG tablet Take 1 tablet (10 mg total) by mouth daily. 03/20/16 04/19/16  Jennye Moccasin, MD  magic mouthwash w/lidocaine SOLN  Take 5 mLs by mouth 4 (four) times daily. 10/23/16   Cuthriell, Delorise Royals, PA-C  predniSONE (STERAPRED UNI-PAK 21 TAB) 10 MG (21) TBPK tablet Dispense steroid taper pack as directed 07/13/16   Emily Filbert, MD  traMADol (ULTRAM) 50 MG tablet Take 1 tablet (50 mg total) by mouth every 6 (six) hours as needed. 08/24/17 08/24/18  Evon Slack, PA-C    Family History No family history on file.  Social History Social History   Tobacco Use  . Smoking status: Current Every Day Smoker  . Smokeless tobacco: Never Used  Substance Use Topics  . Alcohol use: No  . Drug use: No     Allergies   Aspirin and Ibuprofen   Review of Systems Review of Systems  Constitutional: Negative.  Negative for chills and fever.  HENT: Positive for dental problem. Negative for drooling, facial swelling, mouth sores, trouble swallowing and voice change.   Respiratory: Negative for shortness of breath.   Cardiovascular: Negative for chest pain.  Gastrointestinal: Negative for nausea and vomiting.  Musculoskeletal: Negative for arthralgias, neck pain and neck stiffness.  Skin: Negative.   Psychiatric/Behavioral: Negative for confusion.  All other systems reviewed and are negative.    Physical Exam Updated Vital Signs BP (!) 163/107 (BP Location: Right Arm)   Pulse 89   Temp (!) 97.4 F (36.3  C)   Resp 17   Wt 81.6 kg (180 lb)   SpO2 97%   BMI 24.41 kg/m   Physical Exam  Constitutional: He is oriented to person, place, and time. He appears well-developed and well-nourished. No distress.  HENT:  Head: Normocephalic and atraumatic.  Right Ear: External ear normal.  Left Ear: External ear normal.  Nose: Nose normal.  Mouth/Throat: Uvula is midline and oropharynx is clear and moist. No oral lesions. No trismus in the jaw. Normal dentition. Dental abscesses and dental caries present. No uvula swelling.    Eyes: EOM are normal.  Neck: Normal range of motion. Neck supple.    Cardiovascular: Normal rate. Exam reveals no gallop and no friction rub.  No murmur heard. Pulmonary/Chest: Effort normal and breath sounds normal. No respiratory distress.  Neurological: He is alert and oriented to person, place, and time.  Skin: Skin is warm and dry.  Psychiatric: He has a normal mood and affect. His behavior is normal. Thought content normal.     ED Treatments / Results  Labs (all labs ordered are listed, but only abnormal results are displayed) Labs Reviewed - No data to display  EKG  EKG Interpretation None       Radiology No results found.  Procedures Procedures (including critical care time)  Medications Ordered in ED Medications  amoxicillin (AMOXIL) capsule 500 mg (not administered)  traMADol (ULTRAM) tablet 50 mg (not administered)     Initial Impression / Assessment and Plan / ED Course  I have reviewed the triage vital signs and the nursing notes.  Pertinent labs & imaging results that were available during my care of the patient were reviewed by me and considered in my medical decision making (see chart for details).     37 year old male with dental pain.  He is started on amoxicillin and tramadol.  He will take Tylenol as needed for additional pain relief.  He will follow-up with dental clinic.  He is educated on signs and symptoms return to the ED for.  Final Clinical Impressions(s) / ED Diagnoses   Final diagnoses:  Pain, dental  Dental caries    ED Discharge Orders        Ordered    amoxicillin (AMOXIL) 875 MG tablet  2 times daily     08/24/17 2213    traMADol (ULTRAM) 50 MG tablet  Every 6 hours PRN     08/24/17 2213       Evon SlackGaines, Thomas C, PA-C 08/24/17 2216    Phineas SemenGoodman, Graydon, MD 08/24/17 2248

## 2017-08-24 NOTE — Discharge Instructions (Signed)
Please take medications as prescribed and [facility].  Return to the emergency department for any worsening symptoms or urgent changes in health.

## 2017-08-28 ENCOUNTER — Emergency Department: Payer: Self-pay

## 2017-08-28 ENCOUNTER — Emergency Department
Admission: EM | Admit: 2017-08-28 | Discharge: 2017-08-29 | Disposition: A | Discharge status: Home / Self Care | Payer: Self-pay | Attending: Emergency Medicine | Admitting: Emergency Medicine

## 2017-08-28 DIAGNOSIS — R55 Syncope and collapse: Secondary | ICD-10-CM | POA: Insufficient documentation

## 2017-08-28 DIAGNOSIS — R51 Headache: Secondary | ICD-10-CM | POA: Insufficient documentation

## 2017-08-28 DIAGNOSIS — R112 Nausea with vomiting, unspecified: Secondary | ICD-10-CM | POA: Insufficient documentation

## 2017-08-28 DIAGNOSIS — Z79899 Other long term (current) drug therapy: Secondary | ICD-10-CM | POA: Insufficient documentation

## 2017-08-28 DIAGNOSIS — I10 Essential (primary) hypertension: Secondary | ICD-10-CM | POA: Insufficient documentation

## 2017-08-28 DIAGNOSIS — R569 Unspecified convulsions: Secondary | ICD-10-CM | POA: Insufficient documentation

## 2017-08-28 DIAGNOSIS — F17210 Nicotine dependence, cigarettes, uncomplicated: Secondary | ICD-10-CM | POA: Insufficient documentation

## 2017-08-28 MED ORDER — SODIUM CHLORIDE 0.9 % IV SOLN
1000.0000 mg | Freq: Once | INTRAVENOUS | Status: AC
  Administered 2017-08-29: 1000 mg via INTRAVENOUS
  Filled 2017-08-28: qty 10

## 2017-08-28 NOTE — ED Provider Notes (Signed)
[facility] Emergency Department Provider Note   First MD Initiated Contact with Patient 08/28/17 2302     (approximate)  I have reviewed the triage vital signs and the nursing notes.   HISTORY  Chief Complaint Loss of Consciousness    HPI Jeremy Yoder is a 37 y.o. male with below list of chronic medical conditions including seizure disorder to which the patient admits to be noncompliant with medication times months presents to the emergency department following syncope with witnessed seizure-like activity by the patient's family.  Patient has no recollection of the incident.  Patient states upon awakening here he apparently abruptly lost consciousness.  Patient admits to headache nausea vomiting before the incident.   Past Medical History:  Diagnosis Date  . Hyperlipidemia   . Hypertension   . Seizures (HCC)     There are no active problems to display for this patient.   Past Surgical History:  Procedure Laterality Date  . arm    . FOREARM SURGERY    . KNEE SURGERY      Prior to Admission medications   Medication Sig Start Date End Date Taking? Authorizing Provider  albuterol (PROVENTIL HFA;VENTOLIN HFA) 108 (90 Base) MCG/ACT inhaler Inhale 2 puffs into the lungs every 6 (six) hours as needed for wheezing or shortness of breath. 03/20/16   Jennye Moccasin, MD  amoxicillin (AMOXIL) 875 MG tablet Take 1 tablet (875 mg total) by mouth 2 (two) times daily for 7 days. X 10 days 08/24/17 08/31/17  Evon Slack, PA-C  azithromycin (ZITHROMAX Z-PAK) 250 MG tablet Take as directed 03/20/16   Jennye Moccasin, MD  carbamazepine (TEGRETOL) 200 MG tablet Take 1 tablet (200 mg total) by mouth 2 (two) times daily. 08/29/17   Darci Current, MD  chlorpheniramine-HYDROcodone [facility] PENNKINETIC ER) 10-8 MG/5ML SUER Take 5 mLs by mouth 2 (two) times daily. 07/13/16   Emily Filbert, MD  lidocaine (XYLOCAINE) 2 % solution Use as directed 20 mLs in the  mouth or throat as needed for mouth pain. 05/06/17   Darci Current, MD  lisinopril (PRINIVIL,ZESTRIL) 10 MG tablet Take 1 tablet (10 mg total) by mouth daily. 03/20/16 04/19/16  Jennye Moccasin, MD  magic mouthwash w/lidocaine SOLN Take 5 mLs by mouth 4 (four) times daily. 10/23/16   Cuthriell, Delorise Royals, PA-C  predniSONE (STERAPRED UNI-PAK 21 TAB) 10 MG (21) TBPK tablet Dispense steroid taper pack as directed 07/13/16   Emily Filbert, MD  traMADol (ULTRAM) 50 MG tablet Take 1 tablet (50 mg total) by mouth every 6 (six) hours as needed. 08/24/17 08/24/18  Evon Slack, PA-C    Allergies Aspirin and Ibuprofen  No family history on file.  Social History Social History   Tobacco Use  . Smoking status: Current Every Day Smoker  . Smokeless tobacco: Never Used  Substance Use Topics  . Alcohol use: No  . Drug use: No    Review of Systems Constitutional: No fever/chills Eyes: No visual changes. ENT: No sore throat. Cardiovascular: Denies chest pain. Respiratory: Denies shortness of breath. Gastrointestinal: No abdominal pain.  No nausea, no vomiting.  No diarrhea.  No constipation. Genitourinary: Negative for dysuria. Musculoskeletal: Negative for neck pain.  Negative for back pain. Integumentary: Negative for rash. Neurological: Negative for headaches, focal weakness or numbness.  Positive for syncope and witnessed seizure-like activity.   ____________________________________________   PHYSICAL EXAM:  VITAL SIGNS: ED Triage Vitals [08/28/17 2300]  Enc Vitals Group  BP      Pulse Rate 97     Resp      Temp (!) 97.2 F (36.2 C)     Temp Source Oral     SpO2      Weight      Height      Head Circumference      Peak Flow      Pain Score      Pain Loc      Pain Edu?      Excl. in GC?     Constitutional: Alert and oriented. Well appearing and in no acute distress. Eyes: Conjunctivae are normal. PERRL. EOMI. Head: Atraumatic. Mouth/Throat: Mucous  membranes are moist. Oropharynx non-erythematous. Neck: No stridor. Cardiovascular: Normal rate, regular rhythm. Good peripheral circulation. Grossly normal heart sounds. Respiratory: Normal respiratory effort.  No retractions. Lungs CTAB. Gastrointestinal: Soft and nontender. No distention.  Musculoskeletal: No lower extremity tenderness nor edema. No gross deformities of extremities. Neurologic:  Normal speech and language.  Baseline right hand weakness, contraction and numbness  skin:  Skin is warm, dry and intact. No rash noted. Psychiatric: Mood and affect are normal. Speech and behavior are normal.  ____________________________________________   LABS (all labs ordered are listed, but only abnormal results are displayed)  Labs Reviewed  CBC - Abnormal; Notable for the following components:      Result Value   WBC 12.6 (*)    All other components within normal limits  COMPREHENSIVE METABOLIC PANEL - Abnormal; Notable for the following components:   Glucose, Bld 108 (*)    All other components within normal limits  URINALYSIS, COMPLETE (UACMP) WITH MICROSCOPIC - Abnormal; Notable for the following components:   Color, Urine STRAW (*)    APPearance CLEAR (*)    All other components within normal limits   ____________________________________________  EKG  ED ECG REPORT I, [place] Neelie Welshans, the attending physician, personally viewed and interpreted this ECG.   Date: 08/29/2017  EKG Time: 10:58 PM  Rate: 88  Rhythm: Normal sinus rhythm  Axis: Normal  Intervals: Normal  ST&T Change: None  ____________________________________________  RADIOLOGY I, Sabin N Kameran Lallier, personally viewed and evaluated these images (plain radiographs) as part of my medical decision making, as well as reviewing the written report by the radiologist.  Ct Head Wo Contrast  Result Date: 08/28/2017 CLINICAL DATA:  37 year old male with syncope. EXAM: CT HEAD WITHOUT CONTRAST TECHNIQUE: Contiguous  axial images were obtained from the base of the skull through the vertex without intravenous contrast. COMPARISON:  Head CT dated 06/05/2012 FINDINGS: Brain: The ventricles and sulci appropriate size for patient's age. The gray-white matter discrimination is preserved there is no acute intracranial hemorrhage. No mass effect or midline shift noted. No extra-axial fluid collection. There is slight crowding of the foramen magnum with approximately 4-5 mm inferiorly displaced cerebellar tonsil which may represent tonsillar ectopia versus mild Chiari type I malformation. Vascular: No hyperdense vessel or unexpected calcification. Skull: Normal. Negative for fracture or focal lesion. Sinuses/Orbits: Mild mucoperiosteal thickening of paranasal sinuses. No air-fluid levels. The mastoid air cells are clear. Other: None. IMPRESSION: 1. No acute intracranial pathology. 2. 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 Clinical correlation is recommended. Electronically Signed   By: Elgie Collard M.D.   On: 08/28/2017 23:37      Procedures   ____________________________________________   INITIAL IMPRESSION / ASSESSMENT AND PLAN / ED COURSE  As part of my medical decision making, I reviewed the following data  within the electronic MEDICAL RECORD NUMBER Patient with above stated history and physical exam following witnessed seizure by the patient's family. Patient patient admits to be noncompliant with antiseizure medication.  Patient did not know what seizure medication he normally takes and as such he was given Keppra 1 g the emergency department.  [facility] the patient was taking carbamazepine as such he was prescribed that for home. ____________________________________________  FINAL CLINICAL IMPRESSION(S) / ED DIAGNOSES  Final diagnoses:  Seizure (HCC)     MEDICATIONS GIVEN DURING THIS VISIT:  Medications  levETIRAcetam (KEPPRA) 1,000 mg in sodium  chloride 0.9 % 100 mL IVPB (0 mg Intravenous Stopped 08/29/17 0019)     ED Discharge Orders        Ordered    carbamazepine (TEGRETOL) 200 MG tablet  2 times daily     08/29/17 0241       Note:  This document was prepared using Dragon voice recognition software and may include unintentional dictation errors.    Darci CurrentBrown, Mitchell N, MD 08/29/17 [id number]

## 2017-08-28 NOTE — ED Notes (Signed)
Patient stated that he wanted to use restroom in hallway and would not use one in room and wanted to be unhooked and is delaying medication administration

## 2017-08-28 NOTE — ED Triage Notes (Signed)
Pt arrives via [facility] from home, [facility] states family found in floor. Pt was complaining of N?V?HA. Pt states he is dizzy, and has no memory of incident. [facility] was told by family that pt had seizure. Pt has hx of HTN, seizure and has been out of meds for awhile. EDP Brown at bedside.

## 2017-08-29 LAB — COMPREHENSIVE METABOLIC PANEL
ALBUMIN: 4.4 g/dL (ref 3.5–5.0)
ALK PHOS: 69 U/L (ref 38–126)
ALT: 29 U/L (ref 17–63)
ANION GAP: 10 (ref 5–15)
AST: 34 U/L (ref 15–41)
BUN: 13 mg/dL (ref 6–20)
CHLORIDE: 103 mmol/L (ref 101–111)
CO2: 26 mmol/L (ref 22–32)
CREATININE: 0.84 mg/dL (ref 0.61–1.24)
Calcium: 9.3 mg/dL (ref 8.9–10.3)
GFR calc Af Amer: >60 mL/min (ref >60)
GFR calc non Af Amer: >60 mL/min (ref >60)
GLUCOSE: 108 mg/dL — AB (ref 65–99)
Potassium: 3.7 mmol/L (ref 3.5–5.1)
Sodium: 139 mmol/L (ref 135–145)
TOTAL PROTEIN: 8.1 g/dL (ref 6.5–8.1)
Total Bilirubin: 0.8 mg/dL (ref 0.3–1.2)

## 2017-08-29 LAB — URINALYSIS, COMPLETE (UACMP) WITH MICROSCOPIC
Bacteria, UA: NONE SEEN
Bilirubin Urine: NEGATIVE
GLUCOSE, UA: NEGATIVE mg/dL
Hgb urine dipstick: NEGATIVE
Ketones, ur: NEGATIVE mg/dL
Leukocytes, UA: NEGATIVE
Nitrite: NEGATIVE
PH: 7 (ref 5.0–8.0)
Protein, ur: NEGATIVE mg/dL
RBC / HPF: NONE SEEN RBC/hpf (ref 0–5)
Specific Gravity, Urine: 1.008 (ref 1.005–1.030)
Squamous Epithelial / LPF: NONE SEEN
WBC, UA: NONE SEEN WBC/hpf (ref 0–5)

## 2017-08-29 LAB — CBC
HCT: 51 % (ref 40.0–52.0)
HEMOGLOBIN: 17.1 g/dL (ref 13.0–18.0)
MCH: 30.1 pg (ref 26.0–34.0)
MCHC: 33.5 g/dL (ref 32.0–36.0)
MCV: 90 fL (ref 80.0–100.0)
PLATELETS: 205 10*3/uL (ref 150–440)
RBC: 5.66 MIL/uL (ref 4.40–5.90)
RDW: 13.6 % (ref 11.5–14.5)
WBC: 12.6 10*3/uL — ABNORMAL HIGH (ref 3.8–10.6)

## 2017-08-29 MED ORDER — CARBAMAZEPINE 200 MG PO TABS: 200.0000 mg | ORAL_TABLET | Freq: Two times a day (BID) | ORAL | 0 refills | Status: DC

## 2017-08-29 MED ORDER — CARBAMAZEPINE 200 MG PO TABS: 200.0000 mg | ORAL_TABLET | Freq: Two times a day (BID) | ORAL | 2 refills | Status: DC

## 2017-12-20 ENCOUNTER — Other Ambulatory Visit: Payer: Self-pay

## 2017-12-20 ENCOUNTER — Encounter: Payer: Self-pay | Admitting: Emergency Medicine

## 2017-12-20 ENCOUNTER — Emergency Department
Admission: EM | Admit: 2017-12-20 | Discharge: 2017-12-20 | Disposition: A | Discharge status: Home / Self Care | Payer: Medicaid Other | Attending: Emergency Medicine | Admitting: Emergency Medicine

## 2017-12-20 DIAGNOSIS — F17200 Nicotine dependence, unspecified, uncomplicated: Secondary | ICD-10-CM | POA: Diagnosis not present

## 2017-12-20 DIAGNOSIS — Z79899 Other long term (current) drug therapy: Secondary | ICD-10-CM | POA: Diagnosis not present

## 2017-12-20 DIAGNOSIS — J4521 Mild intermittent asthma with (acute) exacerbation: Secondary | ICD-10-CM | POA: Diagnosis not present

## 2017-12-20 DIAGNOSIS — I10 Essential (primary) hypertension: Secondary | ICD-10-CM | POA: Diagnosis not present

## 2017-12-20 DIAGNOSIS — J40 Bronchitis, not specified as acute or chronic: Secondary | ICD-10-CM

## 2017-12-20 DIAGNOSIS — J209 Acute bronchitis, unspecified: Secondary | ICD-10-CM | POA: Diagnosis not present

## 2017-12-20 DIAGNOSIS — R531 Weakness: Secondary | ICD-10-CM | POA: Diagnosis present

## 2017-12-20 LAB — URINALYSIS, COMPLETE (UACMP) WITH MICROSCOPIC
BACTERIA UA: NONE SEEN
Bilirubin Urine: NEGATIVE
GLUCOSE, UA: NEGATIVE mg/dL
KETONES UR: 20 mg/dL — AB
Leukocytes, UA: NEGATIVE
Nitrite: NEGATIVE
PROTEIN: NEGATIVE mg/dL
Specific Gravity, Urine: 1.026 (ref 1.005–1.030)
pH: 5 (ref 5.0–8.0)

## 2017-12-20 LAB — CBC
HCT: 46.6 % (ref 40.0–52.0)
HEMOGLOBIN: 16.5 g/dL (ref 13.0–18.0)
MCH: 31.2 pg (ref 26.0–34.0)
MCHC: 35.3 g/dL (ref 32.0–36.0)
MCV: 88.2 fL (ref 80.0–100.0)
Platelets: 201 10*3/uL (ref 150–440)
RBC: 5.28 MIL/uL (ref 4.40–5.90)
RDW: 13 % (ref 11.5–14.5)
WBC: 16 10*3/uL — ABNORMAL HIGH (ref 3.8–10.6)

## 2017-12-20 LAB — BASIC METABOLIC PANEL
ANION GAP: 9 (ref 5–15)
BUN: 12 mg/dL (ref 6–20)
CHLORIDE: 102 mmol/L (ref 101–111)
CO2: 23 mmol/L (ref 22–32)
Calcium: 8.6 mg/dL — ABNORMAL LOW (ref 8.9–10.3)
Creatinine, Ser: 1.02 mg/dL (ref 0.61–1.24)
GFR calc non Af Amer: >60 mL/min (ref >60)
Glucose, Bld: 130 mg/dL — ABNORMAL HIGH (ref 65–99)
POTASSIUM: 3.7 mmol/L (ref 3.5–5.1)
Sodium: 134 mmol/L — ABNORMAL LOW (ref 135–145)

## 2017-12-20 LAB — GLUCOSE, CAPILLARY: GLUCOSE-CAPILLARY: 133 mg/dL — AB (ref 65–99)

## 2017-12-20 MED ORDER — IPRATROPIUM-ALBUTEROL 0.5-2.5 (3) MG/3ML IN SOLN
3.0000 mL | Freq: Once | RESPIRATORY_TRACT | Status: AC
  Administered 2017-12-20: 3 mL via RESPIRATORY_TRACT
  Filled 2017-12-20: qty 3

## 2017-12-20 MED ORDER — ACETAMINOPHEN 500 MG PO TABS
1000.0000 mg | ORAL_TABLET | Freq: Once | ORAL | Status: AC
  Administered 2017-12-20: 1000 mg via ORAL
  Filled 2017-12-20: qty 2

## 2017-12-20 MED ORDER — PREDNISONE 20 MG PO TABS
60.0000 mg | ORAL_TABLET | Freq: Once | ORAL | Status: AC
  Administered 2017-12-20: 60 mg via ORAL
  Filled 2017-12-20: qty 3

## 2017-12-20 MED ORDER — PREDNISONE 50 MG PO TABS: 50.0000 mg | ORAL_TABLET | Freq: Every day | ORAL | 0 refills | Status: AC

## 2017-12-20 MED ORDER — ALBUTEROL SULFATE HFA 108 (90 BASE) MCG/ACT IN AERS: 2.0000 | INHALATION_SPRAY | Freq: Four times a day (QID) | RESPIRATORY_TRACT | 0 refills | Status: DC | PRN

## 2017-12-20 MED ORDER — SPACER/AERO CHAMBER MOUTHPIECE MISC: 1.0000 [IU] | 0 refills | Status: DC | PRN

## 2017-12-20 NOTE — ED Notes (Signed)
Instruction reviewed verbal understanding given

## 2017-12-20 NOTE — ED Notes (Addendum)
Pt states unable to void at this time. Pt provided labeled specimen cup and asked to have a seat in the lobby.

## 2017-12-20 NOTE — ED Notes (Signed)
Patient discharged to home per MD order. Patient in stable condition, and deemed medically cleared by ED provider for discharge. Discharge instructions reviewed with patient/family using "Teach Back"; verbalized understanding of medication education and administration, and information about follow-up care. Denies further concerns. ° °

## 2017-12-20 NOTE — ED Provider Notes (Signed)
[facility] Emergency Department Provider Note  ____________________________________________   First MD Initiated Contact with Patient 12/20/17 0422     (approximate)  I have reviewed the triage vital signs and the nursing notes.   HISTORY  Chief Complaint Weakness   HPI Jeremy Yoder is a 37 y.o. male who self presents to the emergency department with roughly 2 days of generalized malaise.  He has had mild rhinorrhea dry cough and fatigue.  He says he was working all day today and became generally tired and is concerned he might be dehydrated.  He has had mild nonproductive cough.  Symptoms had insidious onset of been slowly progressive for now constant.  They are worse with exertion and improved with rest.  Past Medical History:  Diagnosis Date  . Hyperlipidemia   . Hypertension   . Seizures (HCC)     There are no active problems to display for this patient.   Past Surgical History:  Procedure Laterality Date  . arm    . FOREARM SURGERY    . KNEE SURGERY      Prior to Admission medications   Medication Sig Start Date End Date Taking? Authorizing Provider  albuterol (PROVENTIL HFA;VENTOLIN HFA) 108 (90 Base) MCG/ACT inhaler Inhale 2 puffs into the lungs every 6 (six) hours as needed for wheezing or shortness of breath. 12/20/17   Merrily Brittle, MD  azithromycin (ZITHROMAX Z-PAK) 250 MG tablet Take as directed 03/20/16   Jennye Moccasin, MD  carbamazepine (TEGRETOL) 200 MG tablet Take 1 tablet (200 mg total) by mouth 2 (two) times daily. 08/29/17   Darci Current, MD  chlorpheniramine-HYDROcodone [facility] PENNKINETIC ER) 10-8 MG/5ML SUER Take 5 mLs by mouth 2 (two) times daily. 07/13/16   Emily Filbert, MD  lidocaine (XYLOCAINE) 2 % solution Use as directed 20 mLs in the mouth or throat as needed for mouth pain. 05/06/17   Darci Current, MD  lisinopril (PRINIVIL,ZESTRIL) 10 MG tablet Take 1 tablet (10 mg total) by mouth daily. 03/20/16  04/19/16  Jennye Moccasin, MD  magic mouthwash w/lidocaine SOLN Take 5 mLs by mouth 4 (four) times daily. 10/23/16   Cuthriell, Delorise Royals, PA-C  predniSONE (DELTASONE) 50 MG tablet Take 1 tablet (50 mg total) by mouth daily for 4 days. 12/20/17 12/24/17  Merrily Brittle, MD  Spacer/Aero Chamber Mouthpiece MISC 1 Units by Does not apply route every 4 (four) hours as needed (wheezing). 12/20/17   Merrily Brittle, MD  traMADol (ULTRAM) 50 MG tablet Take 1 tablet (50 mg total) by mouth every 6 (six) hours as needed. 08/24/17 08/24/18  Evon Slack, PA-C    Allergies Aspirin and Ibuprofen  No family history on file.  Social History Social History   Tobacco Use  . Smoking status: Current Every Day Smoker    Packs/day: 0.50  . Smokeless tobacco: Never Used  Substance Use Topics  . Alcohol use: No  . Drug use: No    Review of Systems Constitutional: No fever/chills Eyes: No visual changes. ENT: Positive for sore throat. Cardiovascular: Denies chest pain. Respiratory: Positive for shortness of breath. Gastrointestinal: No abdominal pain.  No nausea, no vomiting.  No diarrhea.  No constipation. Genitourinary: Negative for dysuria. Musculoskeletal: Negative for back pain. Skin: Negative for rash. Neurological: Negative for headaches, focal weakness or numbness.   ____________________________________________   PHYSICAL EXAM:  VITAL SIGNS: ED Triage Vitals  Enc Vitals Group     BP 12/20/17 0100 (!) 148/100  Pulse Rate 12/20/17 0100 (!) 108     Resp 12/20/17 0100 20     Temp 12/20/17 0100 98.3 F (36.8 C)     Temp Source 12/20/17 0100 Oral     SpO2 12/20/17 0100 95 %     Weight 12/20/17 0059 175 lb (79.4 kg)     Height 12/20/17 0059 6' (1.829 m)     Head Circumference --      Peak Flow --      Pain Score 12/20/17 0058 7     Pain Loc --      Pain Edu? --      Excl. in GC? --     Constitutional: Alert and oriented x4 no acute distress appears somewhat tired lying on his  side Eyes: PERRL EOMI. Head: Atraumatic. Nose: No congestion/rhinnorhea. Mouth/Throat: No trismus uvula midline no pharyngeal erythema or exudate Neck: No stridor.   Cardiovascular: Normal rate, regular rhythm. Grossly normal heart sounds.  Good peripheral circulation. Respiratory: Normal respiratory effort.  No retractions.  Mild expiratory wheeze in all fields Gastrointestinal: Soft nontender Musculoskeletal: No lower extremity edema   Neurologic:  Normal speech and language. No gross focal neurologic deficits are appreciated. Skin:  Skin is warm, dry and intact. No rash noted. Psychiatric: Mood and affect are normal. Speech and behavior are normal.    ____________________________________________   DIFFERENTIAL includes but not limited to  Asthma exacerbation, COPD exacerbation, bronchitis, pneumonia, pneumothorax ____________________________________________   LABS (all labs ordered are listed, but only abnormal results are displayed)  Labs Reviewed  BASIC METABOLIC PANEL - Abnormal; Notable for the following components:      Result Value   Sodium 134 (*)    Glucose, Bld 130 (*)    Calcium 8.6 (*)    All other components within normal limits  CBC - Abnormal; Notable for the following components:   WBC 16.0 (*)    All other components within normal limits  URINALYSIS, COMPLETE (UACMP) WITH MICROSCOPIC - Abnormal; Notable for the following components:   Color, Urine YELLOW (*)    APPearance CLEAR (*)    Hgb urine dipstick SMALL (*)    Ketones, ur 20 (*)    All other components within normal limits  GLUCOSE, CAPILLARY - Abnormal; Notable for the following components:   Glucose-Capillary 133 (*)    All other components within normal limits  CBG MONITORING, ED    Lab work reviewed by me with elevated white count which is nonspecific __________________________________________  EKG  ED ECG REPORT I, Merrily Brittle, the attending physician, personally viewed and  interpreted this ECG.  Date: 12/20/2017 EKG Time:  Rate: 117 Rhythm: Sinus tachycardia QRS Axis: normal Intervals: normal ST/T Wave abnormalities: normal Narrative Interpretation: no evidence of acute ischemia  ____________________________________________  RADIOLOGY   ____________________________________________   PROCEDURES  Procedure(s) performed: no  Procedures  Critical Care performed: no  Observation: no ____________________________________________   INITIAL IMPRESSION / ASSESSMENT AND PLAN / ED COURSE  Pertinent labs & imaging results that were available during my care of the patient were reviewed by me and considered in my medical decision making (see chart for details).  Patient arrives generally well-appearing.  I appreciate his triage vital signs however in the room he is no longer tachycardic.  On exam he has mild expiratory wheeze.  He is a cigarette smoker and has a long-standing history of asthma although has never been hospitalized.  Given a DuoNeb with improvement in his symptoms.  He likely has viral  bronchitis versus asthma exacerbation.  We will give him 5 days of steroids and refer him back to primary care.  He is discharged home in improved condition verbalizes understanding and agree with plan.      ____________________________________________   FINAL CLINICAL IMPRESSION(S) / ED DIAGNOSES  Final diagnoses:  Bronchitis  Mild intermittent asthma with acute exacerbation      NEW MEDICATIONS STARTED DURING THIS VISIT:  Discharge Medication List as of 12/20/2017  4:35 AM    START taking these medications   Details  predniSONE (DELTASONE) 50 MG tablet Take 1 tablet (50 mg total) by mouth daily for 4 days., Starting Thu 12/20/2017, Until Mon 12/24/2017, Print    Spacer/Aero Chamber Mouthpiece MISC 1 Units by Does not apply route every 4 (four) hours as needed (wheezing)., Starting Thu 12/20/2017, Print         Note:  This document was  prepared using Dragon voice recognition software and may include unintentional dictation errors.     Merrily Brittle, MD 12/22/17 2212

## 2017-12-20 NOTE — ED Notes (Signed)
Pt in with co generalized weakness and headache since yesterday. Has had nasal congestion and productive cough. Pt denies any fever but has had chills. Pt vomited x 1 and has had decreased appetite.

## 2017-12-20 NOTE — Discharge Instructions (Signed)
It was a pleasure to take care of you today, and thank you for coming to our emergency department.  If you have any questions or concerns before leaving please ask the nurse to grab me and I'm more than happy to go through your aftercare instructions again.  If you were prescribed any opioid pain medication today such as Norco, Vicodin, Percocet, morphine, hydrocodone, or oxycodone please make sure you do not drive when you are taking this medication as it can alter your ability to drive safely.  If you have any concerns once you are home that you are not improving or are in fact getting worse before you can make it to your follow-up appointment, please do not hesitate to call 911 and come back for further evaluation.  Merrily Brittle, MD  Results for orders placed or performed during the hospital encounter of 12/20/17  Basic metabolic panel  Result Value Ref Range   Sodium 134 (L) 135 - 145 mmol/L   Potassium 3.7 3.5 - 5.1 mmol/L   Chloride 102 101 - 111 mmol/L   CO2 23 22 - 32 mmol/L   Glucose, Bld 130 (H) 65 - 99 mg/dL   BUN 12 6 - 20 mg/dL   Creatinine, Ser 1.61 0.61 - 1.24 mg/dL   Calcium 8.6 (L) 8.9 - 10.3 mg/dL   GFR calc non Af Amer >60 >60 mL/min   GFR calc Af Amer >60 >60 mL/min   Anion gap 9 5 - 15  CBC  Result Value Ref Range   WBC 16.0 (H) 3.8 - 10.6 K/uL   RBC 5.28 4.40 - 5.90 MIL/uL   Hemoglobin 16.5 13.0 - 18.0 g/dL   HCT 09.6 04.5 - 40.9 %   MCV 88.2 80.0 - 100.0 fL   MCH 31.2 26.0 - 34.0 pg   MCHC 35.3 32.0 - 36.0 g/dL   RDW 81.1 91.4 - 78.2 %   Platelets 201 150 - 440 K/uL  Urinalysis, Complete w Microscopic  Result Value Ref Range   Color, Urine YELLOW (A) YELLOW   APPearance CLEAR (A) CLEAR   Specific Gravity, Urine 1.026 1.005 - 1.030   pH 5.0 5.0 - 8.0   Glucose, UA NEGATIVE NEGATIVE mg/dL   Hgb urine dipstick SMALL (A) NEGATIVE   Bilirubin Urine NEGATIVE NEGATIVE   Ketones, ur 20 (A) NEGATIVE mg/dL   Protein, ur NEGATIVE NEGATIVE mg/dL   Nitrite  NEGATIVE NEGATIVE   Leukocytes, UA NEGATIVE NEGATIVE   RBC / HPF 6-10 0 - 5 RBC/hpf   WBC, UA 0-5 0 - 5 WBC/hpf   Bacteria, UA NONE SEEN NONE SEEN   Squamous Epithelial / LPF 0-5 0 - 5   Mucus PRESENT   Glucose, capillary  Result Value Ref Range   Glucose-Capillary 133 (H) 65 - 99 mg/dL

## 2017-12-20 NOTE — ED Triage Notes (Signed)
Pt arrives ambulatory to triage with c/o HA and weakness since Wednesday afternoon. 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 Pt reports that he thinks that he is dehydrated. Pt is in NAD.

## 2018-01-02 ENCOUNTER — Other Ambulatory Visit: Payer: Self-pay

## 2018-01-02 ENCOUNTER — Encounter: Payer: Self-pay | Admitting: Emergency Medicine

## 2018-01-02 DIAGNOSIS — K529 Noninfective gastroenteritis and colitis, unspecified: Secondary | ICD-10-CM | POA: Diagnosis present

## 2018-01-02 DIAGNOSIS — G40909 Epilepsy, unspecified, not intractable, without status epilepticus: Secondary | ICD-10-CM | POA: Diagnosis present

## 2018-01-02 DIAGNOSIS — Z833 Family history of diabetes mellitus: Secondary | ICD-10-CM

## 2018-01-02 DIAGNOSIS — Z8249 Family history of ischemic heart disease and other diseases of the circulatory system: Secondary | ICD-10-CM

## 2018-01-02 DIAGNOSIS — R55 Syncope and collapse: Secondary | ICD-10-CM | POA: Diagnosis present

## 2018-01-02 DIAGNOSIS — I4891 Unspecified atrial fibrillation: Principal | ICD-10-CM | POA: Diagnosis present

## 2018-01-02 DIAGNOSIS — I10 Essential (primary) hypertension: Secondary | ICD-10-CM | POA: Diagnosis present

## 2018-01-02 DIAGNOSIS — F17210 Nicotine dependence, cigarettes, uncomplicated: Secondary | ICD-10-CM | POA: Diagnosis present

## 2018-01-02 DIAGNOSIS — Z9114 Patient's other noncompliance with medication regimen: Secondary | ICD-10-CM

## 2018-01-02 MED ORDER — ONDANSETRON 4 MG PO TBDP
4.0000 mg | ORAL_TABLET | Freq: Once | ORAL | Status: AC | PRN
  Administered 2018-01-02: 4 mg via ORAL
  Filled 2018-01-02: qty 1

## 2018-01-02 NOTE — ED Triage Notes (Signed)
Patient to ER for c/o HA with abd pain (generalized), emesis, and syncopal episode this evening. Family member states he "just fell out and then woke straight up". Denies any shaking movements (has h/o seizures). [place]

## 2018-01-03 ENCOUNTER — Inpatient Hospital Stay (HOSPITAL_COMMUNITY)
Admit: 2018-01-03 | Discharge: 2018-01-03 | Disposition: A | Discharge status: Home / Self Care | Payer: Medicaid Other | Attending: Internal Medicine | Admitting: Internal Medicine

## 2018-01-03 ENCOUNTER — Telehealth: Payer: Self-pay | Admitting: *Deleted

## 2018-01-03 ENCOUNTER — Emergency Department: Payer: Medicaid Other

## 2018-01-03 ENCOUNTER — Inpatient Hospital Stay
Admission: EM | Admit: 2018-01-03 | Discharge: 2018-01-03 | DRG: 310 | Disposition: A | Discharge status: Home / Self Care | Payer: Medicaid Other | Attending: Internal Medicine | Admitting: Internal Medicine

## 2018-01-03 ENCOUNTER — Telehealth: Payer: Self-pay | Admitting: Cardiovascular Disease

## 2018-01-03 ENCOUNTER — Encounter: Payer: Self-pay | Admitting: Emergency Medicine

## 2018-01-03 DIAGNOSIS — E860 Dehydration: Secondary | ICD-10-CM

## 2018-01-03 DIAGNOSIS — I10 Essential (primary) hypertension: Secondary | ICD-10-CM | POA: Insufficient documentation

## 2018-01-03 DIAGNOSIS — I361 Nonrheumatic tricuspid (valve) insufficiency: Secondary | ICD-10-CM

## 2018-01-03 DIAGNOSIS — G40909 Epilepsy, unspecified, not intractable, without status epilepticus: Secondary | ICD-10-CM | POA: Diagnosis present

## 2018-01-03 DIAGNOSIS — F17210 Nicotine dependence, cigarettes, uncomplicated: Secondary | ICD-10-CM | POA: Diagnosis present

## 2018-01-03 DIAGNOSIS — Z8249 Family history of ischemic heart disease and other diseases of the circulatory system: Secondary | ICD-10-CM | POA: Diagnosis not present

## 2018-01-03 DIAGNOSIS — I480 Paroxysmal atrial fibrillation: Secondary | ICD-10-CM | POA: Diagnosis present

## 2018-01-03 DIAGNOSIS — Z9114 Patient's other noncompliance with medication regimen: Secondary | ICD-10-CM | POA: Diagnosis not present

## 2018-01-03 DIAGNOSIS — Z833 Family history of diabetes mellitus: Secondary | ICD-10-CM | POA: Diagnosis not present

## 2018-01-03 DIAGNOSIS — I4891 Unspecified atrial fibrillation: Secondary | ICD-10-CM

## 2018-01-03 DIAGNOSIS — R55 Syncope and collapse: Secondary | ICD-10-CM

## 2018-01-03 DIAGNOSIS — K529 Noninfective gastroenteritis and colitis, unspecified: Secondary | ICD-10-CM | POA: Diagnosis present

## 2018-01-03 HISTORY — DX: Tobacco use: Z72.0

## 2018-01-03 LAB — COMPREHENSIVE METABOLIC PANEL
ALBUMIN: 3.9 g/dL (ref 3.5–5.0)
ALT: 17 U/L (ref 17–63)
AST: 22 U/L (ref 15–41)
Alkaline Phosphatase: 64 U/L (ref 38–126)
Anion gap: 6 (ref 5–15)
BUN: 12 mg/dL (ref 6–20)
CO2: 26 mmol/L (ref 22–32)
CREATININE: 0.78 mg/dL (ref 0.61–1.24)
Calcium: 8.8 mg/dL — ABNORMAL LOW (ref 8.9–10.3)
Chloride: 107 mmol/L (ref 101–111)
GFR calc non Af Amer: >60 mL/min (ref >60)
Glucose, Bld: 116 mg/dL — ABNORMAL HIGH (ref 65–99)
POTASSIUM: 4 mmol/L (ref 3.5–5.1)
SODIUM: 139 mmol/L (ref 135–145)
Total Bilirubin: 0.5 mg/dL (ref 0.3–1.2)
Total Protein: 7.6 g/dL (ref 6.5–8.1)

## 2018-01-03 LAB — CBC
HCT: 49.2 % (ref 40.0–52.0)
HEMATOCRIT: 46.7 % (ref 40.0–52.0)
HEMOGLOBIN: 17.1 g/dL (ref 13.0–18.0)
Hemoglobin: 15.7 g/dL (ref 13.0–18.0)
MCH: 30.4 pg (ref 26.0–34.0)
MCH: 31.2 pg (ref 26.0–34.0)
MCHC: 33.7 g/dL (ref 32.0–36.0)
MCHC: 34.7 g/dL (ref 32.0–36.0)
MCV: 89.9 fL (ref 80.0–100.0)
MCV: 90.3 fL (ref 80.0–100.0)
PLATELETS: 218 10*3/uL (ref 150–440)
Platelets: 227 10*3/uL (ref 150–440)
RBC: 5.17 MIL/uL (ref 4.40–5.90)
RBC: 5.47 MIL/uL (ref 4.40–5.90)
RDW: 13.2 % (ref 11.5–14.5)
RDW: 13.4 % (ref 11.5–14.5)
WBC: 15.3 10*3/uL — ABNORMAL HIGH (ref 3.8–10.6)
WBC: 16.9 10*3/uL — AB (ref 3.8–10.6)

## 2018-01-03 LAB — GLUCOSE, CAPILLARY: Glucose-Capillary: 89 mg/dL (ref 65–99)

## 2018-01-03 LAB — BASIC METABOLIC PANEL
Anion gap: 7 (ref 5–15)
BUN: 9 mg/dL (ref 6–20)
CO2: 25 mmol/L (ref 22–32)
Calcium: 8.3 mg/dL — ABNORMAL LOW (ref 8.9–10.3)
Chloride: 107 mmol/L (ref 101–111)
Creatinine, Ser: 0.62 mg/dL (ref 0.61–1.24)
GFR calc Af Amer: >60 mL/min (ref >60)
GFR calc non Af Amer: >60 mL/min (ref >60)
GLUCOSE: 108 mg/dL — AB (ref 65–99)
POTASSIUM: 4.1 mmol/L (ref 3.5–5.1)
Sodium: 139 mmol/L (ref 135–145)

## 2018-01-03 LAB — URINALYSIS, COMPLETE (UACMP) WITH MICROSCOPIC
BACTERIA UA: NONE SEEN
BILIRUBIN URINE: NEGATIVE
Glucose, UA: NEGATIVE mg/dL
Hgb urine dipstick: NEGATIVE
KETONES UR: NEGATIVE mg/dL
LEUKOCYTES UA: NEGATIVE
Nitrite: NEGATIVE
Protein, ur: NEGATIVE mg/dL
SQUAMOUS EPITHELIAL / LPF: NONE SEEN (ref 0–5)
Specific Gravity, Urine: 1.018 (ref 1.005–1.030)
pH: 6 (ref 5.0–8.0)

## 2018-01-03 LAB — MAGNESIUM: MAGNESIUM: 2.2 mg/dL (ref 1.7–2.4)

## 2018-01-03 LAB — ETHANOL

## 2018-01-03 LAB — ECHOCARDIOGRAM COMPLETE
HEIGHTINCHES: 71 in
WEIGHTICAEL: 2841.6 [oz_av]

## 2018-01-03 LAB — PROTIME-INR
INR: 0.87
Prothrombin Time: 11.8 seconds (ref 11.4–15.2)

## 2018-01-03 LAB — LIPASE, BLOOD: LIPASE: 38 U/L (ref 11–51)

## 2018-01-03 LAB — TROPONIN I
Troponin I: <0.03 ng/mL (ref <0.03)
Troponin I: <0.03 ng/mL (ref <0.03)

## 2018-01-03 LAB — CARBAMAZEPINE LEVEL, TOTAL

## 2018-01-03 LAB — APTT: APTT: 38 s — AB (ref 24–36)

## 2018-01-03 LAB — HEPARIN LEVEL (UNFRACTIONATED): Heparin Unfractionated: <0.1 IU/mL — ABNORMAL LOW (ref 0.30–0.70)

## 2018-01-03 MED ORDER — ACETAMINOPHEN 325 MG PO TABS: 650.0000 mg | ORAL_TABLET | ORAL | Status: DC | PRN

## 2018-01-03 MED ORDER — RIVAROXABAN 20 MG PO TABS
20.0000 mg | ORAL_TABLET | Freq: Every day | ORAL | Status: DC
Start: 2018-01-03 — End: 2018-01-03

## 2018-01-03 MED ORDER — ENOXAPARIN SODIUM 80 MG/0.8ML ~~LOC~~ SOLN
1.0000 mg/kg | Freq: Two times a day (BID) | SUBCUTANEOUS | Status: DC
  Administered 2018-01-03: 80 mg via SUBCUTANEOUS
  Filled 2018-01-03: qty 0.8

## 2018-01-03 MED ORDER — SODIUM CHLORIDE 0.9 % IV BOLUS
1000.0000 mL | Freq: Once | INTRAVENOUS | Status: AC
  Administered 2018-01-03: 1000 mL via INTRAVENOUS

## 2018-01-03 MED ORDER — BISACODYL 5 MG PO TBEC: 5.0000 mg | DELAYED_RELEASE_TABLET | Freq: Every day | ORAL | Status: DC | PRN

## 2018-01-03 MED ORDER — RIVAROXABAN 20 MG PO TABS: 20.0000 mg | ORAL_TABLET | Freq: Every day | ORAL | 1 refills | Status: DC

## 2018-01-03 MED ORDER — CARBAMAZEPINE 200 MG PO TABS
200.0000 mg | ORAL_TABLET | Freq: Two times a day (BID) | ORAL | Status: DC
  Administered 2018-01-03: 200 mg via ORAL
  Filled 2018-01-03 (×3): qty 1

## 2018-01-03 MED ORDER — CARVEDILOL 6.25 MG PO TABS
3.1250 mg | ORAL_TABLET | Freq: Two times a day (BID) | ORAL | Status: DC
  Administered 2018-01-03: 3.125 mg via ORAL
  Filled 2018-01-03: qty 1

## 2018-01-03 MED ORDER — METOPROLOL TARTRATE 5 MG/5ML IV SOLN: 5.0000 mg | INTRAVENOUS | Status: DC | PRN

## 2018-01-03 MED ORDER — ALBUTEROL SULFATE (2.5 MG/3ML) 0.083% IN NEBU: 2.5000 mg | INHALATION_SOLUTION | Freq: Four times a day (QID) | RESPIRATORY_TRACT | Status: DC | PRN

## 2018-01-03 MED ORDER — SENNOSIDES-DOCUSATE SODIUM 8.6-50 MG PO TABS: 1.0000 | ORAL_TABLET | Freq: Every evening | ORAL | Status: DC | PRN

## 2018-01-03 MED ORDER — PROCHLORPERAZINE EDISYLATE 10 MG/2ML IJ SOLN
10.0000 mg | Freq: Once | INTRAMUSCULAR | Status: AC
  Administered 2018-01-03: 10 mg via INTRAVENOUS
  Filled 2018-01-03: qty 2

## 2018-01-03 MED ORDER — LEVETIRACETAM 500 MG PO TABS: 500.0000 mg | ORAL_TABLET | Freq: Two times a day (BID) | ORAL | 0 refills | Status: DC

## 2018-01-03 MED ORDER — LACTATED RINGERS IV SOLN
INTRAVENOUS | Status: DC
  Administered 2018-01-03: 04:00:00 via INTRAVENOUS

## 2018-01-03 MED ORDER — ONDANSETRON HCL 4 MG/2ML IJ SOLN: 4.0000 mg | Freq: Four times a day (QID) | INTRAMUSCULAR | Status: DC | PRN

## 2018-01-03 MED ORDER — MAGNESIUM SULFATE 4 GM/100ML IV SOLN
4.0000 g | Freq: Once | INTRAVENOUS | Status: AC
  Administered 2018-01-03: 4 g via INTRAVENOUS
  Filled 2018-01-03: qty 100

## 2018-01-03 MED ORDER — METOPROLOL SUCCINATE ER 50 MG PO TB24: 50.0000 mg | ORAL_TABLET | Freq: Every day | ORAL | 2 refills | Status: DC

## 2018-01-03 MED ORDER — METOPROLOL SUCCINATE ER 50 MG PO TB24: 50.0000 mg | ORAL_TABLET | Freq: Every day | ORAL | Status: DC

## 2018-01-03 NOTE — Discharge Summary (Signed)
[facility] - [place] at [facility]   PATIENT NAME: Jeremy Yoder    MR#:  [id number]  DATE OF BIRTH:  Oct 18, 1980  DATE OF ADMISSION:  01/03/2018 ADMITTING PHYSICIAN: Barbaraann Rondo, MD  DATE OF DISCHARGE: 01/03/2018   PRIMARY CARE PHYSICIAN: [facility], [facility]    ADMISSION DIAGNOSIS:  Dehydration [E86.0] Syncope, cardiogenic [R55] Atrial fibrillation, unspecified type (HCC) [I48.91]  DISCHARGE DIAGNOSIS:  Active Problems:   New onset atrial fibrillation (HCC)   SECONDARY DIAGNOSIS:   Past Medical History:  Diagnosis Date  . Hypertension    a. Noncompliant w/ meds.  . Seizures (HCC)    a. Dx in teens. Noncompliant w/ meds.  . Tobacco abuse     HOSPITAL COURSE:   Was noted to have A fib, with RVR. Monitored on tele and echo done- no intracardiac thrombus, EF normal. Seen by cardiologist- suggested metoprolol 50 mg Daily and xarelto 20 mg daily for a fib Pt will follow up in cardio clinic.  For seizures, he was not compliant with meds. Seen by neurologist- suggest to cont meds. As there si interaction by Tegrertol with xarelto- will give keppra 500 mg bid on discharge.  Counseled to quit smoking for 4 min,.  DISCHARGE CONDITIONS:   Stable.  CONSULTS OBTAINED:  Treatment Team:  Barbaraann Rondo, MD Antonieta Iba, MD Thana Farr, MD  DRUG ALLERGIES:   Allergies  Allergen Reactions  . Aspirin Itching  . Ibuprofen Itching    DISCHARGE MEDICATIONS:   Allergies as of 01/03/2018      Reactions   Aspirin Itching   Ibuprofen Itching      Medication List    STOP taking these medications   albuterol 108 (90 Base) MCG/ACT inhaler Commonly known as:  PROVENTIL HFA;VENTOLIN HFA   azithromycin 250 MG tablet Commonly known as:  ZITHROMAX Z-PAK   carbamazepine 200 MG tablet Commonly known as:  TEGRETOL   chlorpheniramine-HYDROcodone 10-8 MG/5ML Suer Commonly known as:  TUSSIONEX PENNKINETIC ER    lidocaine 2 % solution Commonly known as:  XYLOCAINE   magic mouthwash w/lidocaine Soln   Spacer/Aero Chamber Mouthpiece Misc   traMADol 50 MG tablet Commonly known as:  ULTRAM     TAKE these medications   levETIRAcetam 500 MG tablet Commonly known as:  KEPPRA Take 1 tablet (500 mg total) by mouth 2 (two) times daily.   metoprolol succinate 50 MG 24 hr tablet Commonly known as:  TOPROL-XL Take 1 tablet (50 mg total) by mouth daily. Take with or immediately following a meal. Start taking on:  01/04/2018   rivaroxaban 20 MG Tabs tablet Commonly known as:  XARELTO Take 1 tablet (20 mg total) by mouth daily.        DISCHARGE INSTRUCTIONS:    Follow with PMD in 1-2 weeks. Follow with cardio clinic.  If you experience worsening of your admission symptoms, develop shortness of breath, life threatening emergency, suicidal or homicidal thoughts you must seek medical attention immediately by calling 911 or calling your MD immediately  if symptoms less severe.  You Must read complete instructions/literature along with all the possible adverse reactions/side effects for all the Medicines you take and that have been prescribed to you. Take any new Medicines after you have completely understood and accept all the possible adverse reactions/side effects.   Please note  You were cared for by a hospitalist during your hospital stay. If you have any questions about your discharge medications or the care you received while you  were in the hospital after you are discharged, you can call the unit and asked to speak with the hospitalist on call if the hospitalist that took care of you is not available. Once you are discharged, your primary care physician will handle any further medical issues. Please note that NO REFILLS for any discharge medications will be authorized once you are discharged, as it is imperative that you return to your primary care physician (or establish a relationship with a  primary care physician if you do not have one) for your aftercare needs so that they can reassess your need for medications and monitor your lab values.    Today   CHIEF COMPLAINT:   Chief Complaint  Patient presents with  . Headache  . Emesis  . Abdominal Pain    HISTORY OF PRESENT ILLNESS:  Jeremy Yoder  is a 37 y.o. male with a known history of HTN, Sz D/O who p/w 1d Hx syncope, found to be in new-onset Afib. Pt endorses HA, nausea, vomiting x2 during day. @~1700PM, LOC x30sec witnessed, no Sz activity (pt typically w/ grand mal Sz), regained consciousness w/o post-ictal state/confusion. (-) aura, (-) tonic-clonic activity, (-) eyes rolled back, (-) drooling/foaming at mouth, (-) tongue-biting, (-) urinary/fecal incontinence. (-) head injury, denies any pain or significant injury. Denies EtOH or drug use. States he cannot afford Tegretol, has not been taking it. Vague historian, answers, "I don't know man" for most questions. (-) LH, (-) blurred vision, (-) acute changes in hearing/vision. Denies CP/SOB, palpitations, diaphoresis. Otherwise feels well. (+) tachycardia, irregularly irregular on exam, EKG (+) AF + RVR @ 113bpm. Exam otherwise benign. Labs (+) hyperglycemia (Glu 116), leukocytosis (WBC 16.9), subtherapeutic Tegretol level (< 2.0). CXR (-). CT head (-). Admit to Tele for new-onset Afib.     VITAL SIGNS:  Blood pressure (!) 141/95, pulse 81, temperature 98 F (36.7 C), temperature source Oral, resp. rate 14, height  (1.803 m), weight 80.6 kg (177 lb 9.6 oz), SpO2 97 %.  I/O:    Intake/Output Summary (Last 24 hours) at 01/03/2018 1512 Last data filed at 01/03/2018 0829 Gross per 24 hour  Intake 292.5 ml  Output 200 ml  Net 92.5 ml    PHYSICAL EXAMINATION:  GENERAL:  37 y.o.-year-old patient lying in the bed with no acute distress.  EYES: Pupils equal, round, reactive to light and accommodation. No scleral icterus. Extraocular muscles intact.  HEENT: Head  atraumatic, normocephalic. Oropharynx and nasopharynx clear.  NECK:  Supple, no jugular venous distention. No thyroid enlargement, no tenderness.  LUNGS: Normal breath sounds bilaterally, no wheezing, rales,rhonchi or crepitation. No use of accessory muscles of respiration.  CARDIOVASCULAR: S1, S2 fast and irregular. No murmurs, rubs, or gallops.  ABDOMEN: Soft, non-tender, non-distended. Bowel sounds present. No organomegaly or mass.  EXTREMITIES: No pedal edema, cyanosis, or clubbing.  NEUROLOGIC: Cranial nerves II through XII are intact. Muscle strength 5/5 in all extremities. Sensation intact. Gait not checked.  PSYCHIATRIC: The patient is alert and oriented x 3.  SKIN: No obvious rash, lesion, or ulcer.   DATA REVIEW:   CBC Recent Labs  Lab 01/03/18 0446  WBC 15.3*  HGB 15.7  HCT 46.7  PLT 227    Chemistries  Recent Labs  Lab 01/02/18 2356 01/03/18 0446  NA 139 139  K 4.0 4.1  CL 107 107  CO2 26 25  GLUCOSE 116* 108*  BUN 12 9  CREATININE 0.78 0.62  CALCIUM 8.8* 8.3*  MG 2.2  --  AST 22  --   ALT 17  --   ALKPHOS 64  --   BILITOT 0.5  --     Cardiac Enzymes Recent Labs  Lab 01/03/18 0446  TROPONINI <0.03    Microbiology Results  No results found for this or any previous visit.  RADIOLOGY:  Ct Head Wo Contrast  Result Date: 01/03/2018 CLINICAL DATA:  Syncopal episode or seizure EXAM: CT HEAD WITHOUT CONTRAST TECHNIQUE: Contiguous axial images were obtained from the base of the skull through the vertex without intravenous contrast. COMPARISON:  08/28/2017 head CT FINDINGS: Brain: No evidence of acute infarction, hemorrhage, hydrocephalus, extra-axial collection or mass lesion/mass effect. Low lying cerebellar tonsils. Vascular: No hyperdense vessel or unexpected calcification. Skull: Normal. Negative for fracture or focal lesion. Sinuses/Orbits: Mucosal thickening in the ethmoid sinuses. No acute orbital abnormality. Other: None IMPRESSION: Negative.  No CT  evidence for acute intracranial abnormality. Electronically Signed   By: Jasmine Pang M.D.   On: 01/03/2018 01:09   Dg Chest Port 1 View  Result Date: 01/03/2018 CLINICAL DATA:  Shortness of breath and syncope EXAM: PORTABLE CHEST 1 VIEW COMPARISON:  03/19/2016 FINDINGS: The heart size and mediastinal contours are within normal limits. Both lungs are clear. The visualized skeletal structures are unremarkable. IMPRESSION: No active disease. Electronically Signed   By: Jasmine Pang M.D.   On: 01/03/2018 01:02    EKG:   Orders placed or performed during the hospital encounter of 01/03/18  . ED EKG  . ED EKG  . EKG 12-lead      Management plans discussed with the patient, family and they are in agreement.  CODE STATUS:     Code Status Orders  (From admission, onward)        Start     Ordered   01/03/18 0439  Full code  Continuous     01/03/18 0439    Code Status History    Date Active Date Inactive Code Status Order ID Comments User Context   01/03/2018 0317 01/03/2018 0439 Full Code [id number]  Barbaraann Rondo, MD ED      TOTAL TIME TAKING CARE OF THIS PATIENT: 35 minutes.    Altamese Dilling M.D on 01/03/2018 at 3:12 PM  Between 7am to 6pm - Pager - [phone]  After 6pm go to [email or web address] - Social research officer, government  [facility]  Office  [phone]  CC: Primary care physician; [facility], [facility]   Note: This dictation was prepared with Dragon dictation along with smaller phrase technology. Any transcriptional errors that result from this process are unintentional.

## 2018-01-03 NOTE — Telephone Encounter (Signed)
Patient currently admitted at this time. 

## 2018-01-03 NOTE — Consult Note (Addendum)
Reason for Consult:Seizure medications Referring Physician: Elisabeth Pigeon  CC: Seizure medications  HPI: Jeremy Yoder is an 37 y.o. male with a history of seizures presenting with syncope and new onset atrial fibrillation.  During work up determined that patient not taking anticonvulsant medications.  Patient reports that when taking his Tegretol seizures are well controlled.  Recently has not been compliant because he can not afford to go to the doctor and therefore can not get his medications refilled.  He reports that he is able to afford the medication.  He was taking generic.    Past Medical History:  Diagnosis Date  . Hypertension    a. Noncompliant w/ meds.  . Seizures (HCC)    a. Dx in teens. Noncompliant w/ meds.  . Tobacco abuse     Past Surgical History:  Procedure Laterality Date  . arm    . FOREARM SURGERY    . KNEE SURGERY      Family History  Problem Relation Age of Onset  . Hypertension Mother   . Diabetes Mother   . Atrial fibrillation Mother   . Heart disease Father        Pt doesn't know much about father's history but says he has either a PPM or ICD.    Social History:  reports that he has been smoking.  He has a 10.00 pack-year smoking history. He has never used smokeless tobacco. He reports that he does not drink alcohol or use drugs.  Allergies  Allergen Reactions  . Aspirin Itching  . Ibuprofen Itching    Medications:  I have reviewed the patient's current medications. Prior to Admission:  Medications Prior to Admission  Medication Sig Dispense Refill Last Dose  . albuterol (PROVENTIL HFA;VENTOLIN HFA) 108 (90 Base) MCG/ACT inhaler Inhale 2 puffs into the lungs every 6 (six) hours as needed for wheezing or shortness of breath. 1 Inhaler 0 prn at prn  . carbamazepine (TEGRETOL) 200 MG tablet Take 1 tablet (200 mg total) by mouth 2 (two) times daily. 60 tablet 2 01/02/2018 at Unknown time  . azithromycin (ZITHROMAX Z-PAK) 250 MG tablet Take as directed  (Patient not taking: Reported on 01/03/2018) 6 each 0 Completed Course at Unknown time  . chlorpheniramine-HYDROcodone (TUSSIONEX PENNKINETIC ER) 10-8 MG/5ML SUER Take 5 mLs by mouth 2 (two) times daily. (Patient not taking: Reported on 01/03/2018) 140 mL 0 Not Taking at Unknown time  . lidocaine (XYLOCAINE) 2 % solution Use as directed 20 mLs in the mouth or throat as needed for mouth pain. (Patient not taking: Reported on 01/03/2018) 100 mL 0 Not Taking at Unknown time  . magic mouthwash w/lidocaine SOLN Take 5 mLs by mouth 4 (four) times daily. (Patient not taking: Reported on 01/03/2018) 240 mL 0 Not Taking at Unknown time  . Spacer/Aero Chamber Mouthpiece MISC 1 Units by Does not apply route every 4 (four) hours as needed (wheezing). (Patient not taking: Reported on 01/03/2018) 1 each 0 Not Taking at Unknown time  . traMADol (ULTRAM) 50 MG tablet Take 1 tablet (50 mg total) by mouth every 6 (six) hours as needed. (Patient not taking: Reported on 01/03/2018) 20 tablet 0 Not Taking at Unknown time   Scheduled: . carbamazepine  200 mg Oral BID  . [START ON 01/04/2018] metoprolol succinate  50 mg Oral Daily  . rivaroxaban  20 mg Oral Daily    ROS: History obtained from the patient  General ROS: negative for - chills, fatigue, fever, night sweats, weight gain or  weight loss Psychological ROS: negative for - behavioral disorder, hallucinations, memory difficulties, mood swings or suicidal ideation Ophthalmic ROS: negative for - blurry vision, double vision, eye pain or loss of vision ENT ROS: negative for - epistaxis, nasal discharge, oral lesions, sore throat, tinnitus or vertigo Allergy and Immunology ROS: negative for - hives or itchy/watery eyes Hematological and Lymphatic ROS: negative for - bleeding problems, bruising or swollen lymph nodes Endocrine ROS: negative for - galactorrhea, hair pattern changes, polydipsia/polyuria or temperature intolerance Respiratory ROS: negative for - cough,  hemoptysis, shortness of breath or wheezing Cardiovascular ROS: negative for - chest pain, dyspnea on exertion, edema or irregular heartbeat Gastrointestinal ROS: negative for - abdominal pain, diarrhea, hematemesis, nausea/vomiting or stool incontinence Genito-Urinary ROS: negative for - dysuria, hematuria, incontinence or urinary frequency/urgency Musculoskeletal ROS: decreased right hand use Neurological ROS: as noted in HPI Dermatological ROS: negative for rash and skin lesion changes  Physical Examination: Blood pressure (!) 141/95, pulse 81, temperature 98 F (36.7 C), temperature source Oral, resp. rate 14, height  (1.803 m), weight 80.6 kg (177 lb 9.6 oz), SpO2 97 %.  HEENT-  Normocephalic, no lesions, without obvious abnormality.  Normal external eye and conjunctiva.  Normal TM's bilaterally.  Normal auditory canals and external ears. Normal external nose, mucus membranes and septum.  Normal pharynx. Cardiovascular- S1, S2 normal, pulses palpable throughout   Lungs- chest clear, no wheezing, rales, normal symmetric air entry Abdomen- soft, non-tender; bowel sounds normal; no masses,  no organomegaly Extremities- no edema Lymph-no adenopathy palpable Musculoskeletal-contractures of right hand Skin-warm and dry, no hyperpigmentation, vitiligo, or suspicious lesions  Neurological Examination   Mental Status: Alert, oriented, thought content appropriate.  Speech fluent without evidence of aphasia.  Able to follow 3 step commands without difficulty. Cranial Nerves: II: Discs flat bilaterally; Visual fields grossly normal, pupils equal, round, reactive to light and accommodation III,IV, VI: ptosis not present, extra-ocular motions intact bilaterally V,VII: smile symmetric, facial light touch sensation normal bilaterally VIII: hearing normal bilaterally IX,X: gag reflex present XI: bilateral shoulder shrug XII: midline tongue extension Motor: Moves all extremities against  gravity with minimal movement in the right hand Sensory: Pinprick and light touch intact throughout, bilaterally    Laboratory Studies:   Basic Metabolic Panel: Recent Labs  Lab 01/02/18 2356 01/03/18 0446  NA 139 139  K 4.0 4.1  CL 107 107  CO2 26 25  GLUCOSE 116* 108*  BUN 12 9  CREATININE 0.78 0.62  CALCIUM 8.8* 8.3*  MG 2.2  --     Liver Function Tests: Recent Labs  Lab 01/02/18 2356  AST 22  ALT 17  ALKPHOS 64  BILITOT 0.5  PROT 7.6  ALBUMIN 3.9   Recent Labs  Lab 01/02/18 2356  LIPASE 38   No results for input(s): AMMONIA in the last 168 hours.  CBC: Recent Labs  Lab 01/02/18 2356 01/03/18 0446  WBC 16.9* 15.3*  HGB 17.1 15.7  HCT 49.2 46.7  MCV 89.9 90.3  PLT 218 227    Cardiac Enzymes: Recent Labs  Lab 01/02/18 2356 01/03/18 0446  TROPONINI <0.03 <0.03    BNP: Invalid input(s): POCBNP  CBG: Recent Labs  Lab 01/03/18 0818  GLUCAP 89    Microbiology: No results found for this or any previous visit.  Coagulation Studies: Recent Labs    01/03/18 0446  LABPROT 11.8  INR 0.87    Urinalysis:  Recent Labs  Lab 01/02/18 2356  COLORURINE YELLOW*  LABSPEC 1.018  PHURINE 6.0  GLUCOSEU NEGATIVE  HGBUR NEGATIVE  BILIRUBINUR NEGATIVE  KETONESUR NEGATIVE  PROTEINUR NEGATIVE  NITRITE NEGATIVE  LEUKOCYTESUR NEGATIVE    Lipid Panel:  No results found for: CHOL, TRIG, HDL, CHOLHDL, VLDL, LDLCALC  HgbA1C: No results found for: HGBA1C  Urine Drug Screen:      Component Value Date/Time   LABOPIA NEGATIVE 02/25/2012 0221   COCAINSCRNUR NEGATIVE 02/25/2012 0221   LABBENZ NEGATIVE 02/25/2012 0221   AMPHETMU NEGATIVE 02/25/2012 0221   THCU NEGATIVE 02/25/2012 0221   LABBARB NEGATIVE 02/25/2012 0221    Alcohol Level:  Recent Labs  Lab 01/02/18 2356  ETH <10    Other results: EKG: atrial fibrillation with RVR, rate 113 bpm.  Imaging: Ct Head Wo Contrast  Result Date: 01/03/2018 CLINICAL DATA:  Syncopal episode or  seizure EXAM: CT HEAD WITHOUT CONTRAST TECHNIQUE: Contiguous axial images were obtained from the base of the skull through the vertex without intravenous contrast. COMPARISON:  08/28/2017 head CT FINDINGS: Brain: No evidence of acute infarction, hemorrhage, hydrocephalus, extra-axial collection or mass lesion/mass effect. Low lying cerebellar tonsils. Vascular: No hyperdense vessel or unexpected calcification. Skull: Normal. Negative for fracture or focal lesion. Sinuses/Orbits: Mucosal thickening in the ethmoid sinuses. No acute orbital abnormality. Other: None IMPRESSION: Negative.  No CT evidence for acute intracranial abnormality. Electronically Signed   By: Jasmine Pang M.D.   On: 01/03/2018 01:09   Dg Chest Port 1 View  Result Date: 01/03/2018 CLINICAL DATA:  Shortness of breath and syncope EXAM: PORTABLE CHEST 1 VIEW COMPARISON:  03/19/2016 FINDINGS: The heart size and mediastinal contours are within normal limits. Both lungs are clear. The visualized skeletal structures are unremarkable. IMPRESSION: No active disease. Electronically Signed   By: Jasmine Pang M.D.   On: 01/03/2018 01:02     Assessment/Plan: 37 year old male with a history of seizures, noncompliant with medications.  Reports that he can afford medication.  Is on generic.  Would continue carbamazepine at  BID since patient reports that this provided adequate seizure control.    No further neurologic intervention is recommended at this time.  If further questions arise, please call or page at that time.  Thank you for allowing neurology to participate in the care of this patient.    Thana Farr, MD Neurology [phone] 01/03/2018, 1:19 PM

## 2018-01-03 NOTE — Telephone Encounter (Signed)
-----   Message from Coralee Rud sent at 01/03/2018  1:55 PM EDT ----- Regarding: tcm/ph 6/18 2:20 Dr. Mariah Milling

## 2018-01-03 NOTE — ED Notes (Signed)
Pt to the ER for not feeling well. Pt states he has had a headache and been nauseated. Wife states they came because he had a syncopal episode tonight. Pt states he doesn't remember that. Pt has scarring to the right arm but does not want to talk about it. Asked that nobody use the right arm. Pink sleeve applied.

## 2018-01-03 NOTE — Consult Note (Signed)
Cardiology Consult    Patient ID: Jeremy Yoder MRN: [id number], DOB/AGE: [birth year]   Admit date: 01/03/2018 Date of Consult: 01/03/2018  Primary Physician: [facility] Primary Cardiologist: Jeremy Nordmann, MD - new  Requesting Provider: Ozella Rocks, MD  Patient Profile    Jeremy Yoder is a 37 y.o. male with a history of HTN, tob abuse, and Sz d/o, who is being seen today for the evaluation of Afib RVR at the request of Dr. Elisabeth Pigeon.  Past Medical History   Past Medical History:  Diagnosis Date  . Hypertension    a. Noncompliant w/ meds.  . Seizures (HCC)    a. Dx in teens. Noncompliant w/ meds.  . Tobacco abuse     Past Surgical History:  Procedure Laterality Date  . arm    . FOREARM SURGERY    . KNEE SURGERY       Allergies  Allergies  Allergen Reactions  . Aspirin Itching  . Ibuprofen Itching    History of Present Illness    37 y/o ? with a h/o HTN, tob abuse, and Sz d/o.  He has no known prior cardiac history.  He was dx with seizures in his teens but has been off of all medication x several years in the absence of insurance coverage.  He lives in [place] with his wife and four children (ages 57, 93, 34, & 37).  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  He does not routinely exercise but does not typically experience difficulty carrying out routine activities.  He was in his USOH on 5/22, [place]  They ate grilled hot dogs for dinner.  While driving home, around 4098, he developed nausea.  He had his wife pull the car over on the side of the road and he experienced vomiting x 1.  He says this was small volume.  They then drove home and he continued to have nausea and stomach upset.  He vomited one additional time after returning home - small volume.  He did not eat or drink anything for the remainder of the evening and says that he was starting to feel better.  He went off to bed and fell asleep later in the evening.  At ~  2300, he awoke to use the bathroom (felt like he had to urinate).  His wife witnessed him rise out of bed and then immediately after standing @ the side of the bed, he fell backward into the bed.  He has no recollection of this occurring and denies experiencing any presyncope/lightheadedness, palpitations, chest pain, dyspnea, n, v, diaphoresis either preceding or following syncope.  No sz activity noted.  He was apparently unresponsive for ~ 30 seconds.  He says he felt fine when he regained consciousness but that his wife insisted he come into the ED.  In the ED, he was noted to have an elevated WBC count @ 16.9.  Renal fxn, lytes were wnl.  CXR w/o cardiopulm dzs.  Head CT negative for acute intracranial abnormalities.  He was found to be in Afib w/ RVR, initially in the 1-teens.  He was admitted and placed on PO carvedilol and Wilson lovenox.    He remains in Afib this AM.  Rates 90-100.  He says he feels fine, is asymptomatic, and wishes to go home.  He denies any current or prior history of palpitations,chest pain, or dyspnea.  He is willing to stay for  echo, but does not wish to proceed with TEE/DCCV during this admission.    Inpatient Medications    . carbamazepine  200 mg Oral BID  . [START ON 01/04/2018] metoprolol succinate  50 mg Oral Daily  . rivaroxaban  20 mg Oral Daily    Family History    Family History  Problem Relation Age of Onset  . Hypertension Mother   . Diabetes Mother   . Atrial fibrillation Mother   . Heart disease Father        Pt doesn't know much about father's history but says he has either a PPM or ICD.   indicated that his mother is alive. He indicated that his father is alive.   Social History    Social History   Socioeconomic History  . Marital status: Married    Spouse name: Not on file  . Number of children: Not on file  . Years of education: Not on file  . Highest education level: Not on file  Occupational History  . Not on file  Social Needs  .  Financial resource strain: Not on file  . Food insecurity:    Worry: Not on file    Inability: Not on file  . Transportation needs:    Medical: Not on file    Non-medical: Not on file  Tobacco Use  . Smoking status: Current Every Day Smoker    Packs/day: 0.50    Years: 20.00    Pack years: 10.00  . Smokeless tobacco: Never Used  . Tobacco comment: currently smoking 0.5 - 1 ppd.  Substance and Sexual Activity  . Alcohol use: No  . Drug use: No  . Sexual activity: Not on file  Lifestyle  . Physical activity:    Days per week: Not on file    Minutes per session: Not on file  . Stress: Not on file  Relationships  . Social connections:    Talks on phone: Not on file    Gets together: Not on file    Attends religious service: Not on file    Active member of club or organization: Not on file    Attends meetings of clubs or organizations: Not on file    Relationship status: Not on file  . Intimate partner violence:    Fear of current or ex partner: Not on file    Emotionally abused: Not on file    Physically abused: Not on file    Forced sexual activity: Not on file  Other Topics Concern  . Not on file  Social History Narrative   Lives in [place] with his wife and four children (ages 48, 7, 31, & 10).  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     Review of Systems    General:  No chills, fever, night sweats or weight changes.  Cardiovascular:  No chest pain, dyspnea on exertion, edema, orthopnea, palpitations, paroxysmal nocturnal dyspnea. +++ syncope - no apparent prodrome. Dermatological: No rash, lesions/masses Respiratory: No cough, dyspnea Urologic: No hematuria, dysuria Abdominal:   +++ nausea, +++ vomiting, no diarrhea, bright red blood per rectum, melena, or hematemesis Neurologic:  No visual changes, wkns, changes in mental status. All other systems reviewed and are otherwise negative except as noted above.  Physical Exam      Blood pressure (!) 141/95, pulse 81, temperature 98 F (36.7 C), temperature source Oral, resp. rate 14, height  (1.803 m), weight 177  lb 9.6 oz (80.6 kg), SpO2 97 %.  General: Pleasant, NAD Psych: Normal affect. Neuro: Alert and oriented X 3. Moves all extremities spontaneously. HEENT: Normal  Neck: Supple without bruits or JVD. Lungs:  Resp regular and unlabored, CTA. Heart: IR, IR, no s3, s4, or murmurs. Abdomen: Soft, non-tender, non-distended, BS + x 4.  Extremities: No clubbing, cyanosis or edema. DP/PT/Radials 2+ and equal bilaterally.  Labs     Recent Labs    01/02/18 2356 01/03/18 0446  TROPONINI <0.03 <0.03   Lab Results  Component Value Date   WBC 15.3 (H) 01/03/2018   HGB 15.7 01/03/2018   HCT 46.7 01/03/2018   MCV 90.3 01/03/2018   PLT 227 01/03/2018    Recent Labs  Lab 01/02/18 2356 01/03/18 0446  NA 139 139  K 4.0 4.1  CL 107 107  CO2 26 25  BUN 12 9  CREATININE 0.78 0.62  CALCIUM 8.8* 8.3*  PROT 7.6  --   BILITOT 0.5  --   ALKPHOS 64  --   ALT 17  --   AST 22  --   GLUCOSE 116* 108*    Radiology Studies    Ct Head Wo Contrast  Result Date: 01/03/2018 CLINICAL DATA:  Syncopal episode or seizure EXAM: CT HEAD WITHOUT CONTRAST TECHNIQUE: Contiguous axial images were obtained from the base of the skull through the vertex without intravenous contrast. COMPARISON:  08/28/2017 head CT FINDINGS: Brain: No evidence of acute infarction, hemorrhage, hydrocephalus, extra-axial collection or mass lesion/mass effect. Low lying cerebellar tonsils. Vascular: No hyperdense vessel or unexpected calcification. Skull: Normal. Negative for fracture or focal lesion. Sinuses/Orbits: Mucosal thickening in the ethmoid sinuses. No acute orbital abnormality. Other: None IMPRESSION: Negative.  No CT evidence for acute intracranial abnormality. Electronically Signed   By: Jasmine Pang M.D.   On: 01/03/2018 01:09   Dg Chest Port 1 View  Result Date:  01/03/2018 CLINICAL DATA:  Shortness of breath and syncope EXAM: PORTABLE CHEST 1 VIEW COMPARISON:  03/19/2016 FINDINGS: The heart size and mediastinal contours are within normal limits. Both lungs are clear. The visualized skeletal structures are unremarkable. IMPRESSION: No active disease. Electronically Signed   By: Jasmine Pang M.D.   On: 01/03/2018 01:02    ECG & Cardiac Imaging    Afib, 113, no acute ST/T changes.  Assessment & Plan    1.  Syncope: Pt developed n/v following dinner on 5/23.  Ss seemed to improve after second vomiting episode in the early evening.  He awoke to urinate @ ~ 2300 and stood at the side of his bed and his wife witnessed him fall back into bed.  He was w/o consciousness for ~ 30 secs.  He has no recollection of this and denies any presyncope, palpitations, chest pain, n, v, diaphoresis either before or after this event.  He says that his vomiting episodes were small volume but that he had not had anything to eat or drink after them.  No apparent sz activity noted.  Head CT neg for acute IC abnormality.  Found to be in Afib.  He remains in Afib but is asymptomatic, making this very unlikely as a cause for his syncope.  Etiology uncertain but suspect orthostasis in the setting of gastroenteritis most likely explanation.  F/u echo to eval EF.  Nl EF would make malignant arrhythmias much less likely.  Could consider outpt event monitoring, though pt has no insurance. Agree w/ resumption of sz med (noncompliant 2/2 lack of insurance).  2.  Afib RVR:  No known prior hx.  As above, noted in ED overnight following syncopal spell @ home.  Pt is asymptomatic this AM, thus we have no way of knowing duration of afib.  Rate is reasonable on  blocker therapy.  CHA2DS2VASc = 1.  Echo pending.  TSH pending.  We discussed options for mgmt and he says that he has no interest in TEE/DCCV during this admission.  In that setting, in order to simplify his regimen and hopefully improve  compliance, we will switch carvedilol to Toprol XL 50 mg daily.  We will initiate Xarelto  Daily and I've asked case mgmt to eval for med assistance.  I stressed the importance of medication compliance as if he remains in Afib after three wks of OAC, we would pursue outpt DCCV.  If however, he misses any doses, he would require TEE as well.  Provided that echo looks ok, we could potentially d/c xarelto 4 wks after DCCV given low CHA2DS2VASc.  He was agreeable with this plan. Ambulate to assess HR excursion.  Probable d/c later today after echo done. I will arrange for office f/u in 3 wks.  3. Gastroenteritis/Leukocytosis:  Developed N/V after eating hot dogs for dinner last night.  Feels better this AM.  WBC 16.9 on admission.  15.3 this AM.  Afebrile.    4.  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  Resumed here.    5.  Tob Abuse;  Cessation advised.  Signed, Nicolasa Ducking, NP 01/03/2018, 9:39 AM  For questions or updates, please contact   Please consult [email or web address] for contact info under Cardiology/STEMI.

## 2018-01-03 NOTE — Progress Notes (Signed)
Patient refused bed alarm. Educated on safety precaution, how and when to call for assistance. Verbalized understanding. Room near nursing station. Will monitor

## 2018-01-03 NOTE — Progress Notes (Signed)
*  PRELIMINARY RESULTS* Echocardiogram 2D Echocardiogram has been performed.  Joanette Gula Diara Chaudhari 01/03/2018, 10:49 AM

## 2018-01-03 NOTE — Telephone Encounter (Signed)
Medication Samples have been provided to the patient per Dr. Windell Hummingbird request. Patient was in room 245 with family at bedside. Coupon cards given as well as samples. No further questions at this time.  Drug name: Xarelto       Strength: 20 mg        Qty: 4 bottles  LOT: [id number]  Exp.Date: 4/21

## 2018-01-03 NOTE — ED Provider Notes (Signed)
ED ECG REPORT I, Merrily Brittle, the attending physician, personally viewed and interpreted this ECG.  Date: 01/03/2018 EKG Time:  Rate: 113 Rhythm: Atrial fibrillation with rapid ventricular response QRS Axis: normal Intervals: normal ST/T Wave abnormalities: normal Narrative Interpretation: no evidence of acute ischemia   The patient has had a syncopal episode versus seizures.  EKG shows atrial fibrillation which on chart review appears to be brand-new.  Advised to have the patient come back to next bed as he may have thrown a clot versus had cardiogenic syncope.   Merrily Brittle, MD 01/03/18 0002

## 2018-01-03 NOTE — H&P (Signed)
[employer] [facility] [place] [facility] Fabrizzio [facility]cella    MR#:  [id number]  DATE OF BIRTH:  12-31-1980  DATE OF ADMISSION:  01/03/2018  PRIMARY CARE PHYSICIAN: Center, [facility]   REQUESTING/REFERRING PHYSICIAN: Merrily Brittle, MD  CHIEF COMPLAINT:   Chief Complaint  Patient presents with  . Headache  . Emesis  . Abdominal Pain    HISTORY OF PRESENT ILLNESS:  Newman Waren  is a 37 y.o. male with a known history of HTN, Sz D/O who p/w 1d Hx syncope, found to be in new-onset Afib. Pt endorses HA, nausea, vomiting x2 during day. @~1700PM, LOC x30sec witnessed, no Sz activity (pt typically w/ grand mal Sz), regained consciousness w/o post-ictal state/confusion. (-) aura, (-) tonic-clonic activity, (-) eyes rolled back, (-) drooling/foaming at mouth, (-) tongue-biting, (-) urinary/fecal incontinence. (-) head injury, denies any pain or significant injury. Denies EtOH or drug use. States he cannot afford Tegretol, has not been taking it. Vague historian, answers, "I don't know man" for most questions. (-) LH, (-) blurred vision, (-) acute changes in hearing/vision. Denies CP/SOB, palpitations, diaphoresis. Otherwise feels well. (+) tachycardia, irregularly irregular on exam, EKG (+) AF + RVR @ 113bpm. Exam otherwise benign. Labs (+) hyperglycemia (Glu 116), leukocytosis (WBC 16.9), subtherapeutic Tegretol level (< 2.0). CXR (-). CT head (-). Admit to Tele for new-onset Afib.  PAST MEDICAL HISTORY:   Past Medical History:  Diagnosis Date  . Hyperlipidemia   . Hypertension   . Seizures (HCC)     PAST SURGICAL HISTORY:   Past Surgical History:  Procedure Laterality Date  . arm    . FOREARM SURGERY    . KNEE SURGERY      SOCIAL HISTORY:   Social History   Tobacco Use  . Smoking status: Current Every Day Smoker    Packs/day: 0.50  . Smokeless tobacco: Never Used  Substance Use Topics  . Alcohol use: No    FAMILY HISTORY:  History  reviewed. No pertinent family history.  DRUG ALLERGIES:   Allergies  Allergen Reactions  . Aspirin Itching  . Ibuprofen Itching    REVIEW OF SYSTEMS:   Review of Systems  Constitutional: Negative for chills, diaphoresis, fever, malaise/fatigue and weight loss.  HENT: Negative for congestion, ear pain, hearing loss, sinus pain, sore throat and tinnitus.   Eyes: Negative for blurred vision, double vision and photophobia.  Respiratory: Negative for cough, hemoptysis, sputum production, shortness of breath and wheezing.   Cardiovascular: Negative for chest pain, palpitations, orthopnea, claudication, leg swelling and PND.  Gastrointestinal: Positive for nausea and vomiting. Negative for abdominal pain, blood in stool, constipation, diarrhea, heartburn and melena.  Genitourinary: Negative for dysuria, frequency, hematuria and urgency.  Musculoskeletal: Negative for back pain, joint pain, myalgias and neck pain.  Skin: Negative for itching and rash.  Neurological: Positive for dizziness, loss of consciousness and headaches. Negative for tingling, tremors, sensory change, speech change, focal weakness, seizures and weakness.  Psychiatric/Behavioral: Negative for memory loss. The patient does not have insomnia.    MEDICATIONS AT HOME:   Prior to Admission medications   Medication Sig Start Date End Date Taking? Authorizing Provider  albuterol (PROVENTIL HFA;VENTOLIN HFA) 108 (90 Base) MCG/ACT inhaler Inhale 2 puffs into the lungs every 6 (six) hours as needed for wheezing or shortness of breath. 12/20/17  Yes Merrily Brittle, MD  carbamazepine (TEGRETOL) 200 MG tablet Take 1 tablet (200 mg total) by mouth 2 (two) times daily. 08/29/17  Yes Darci Current, MD  azithromycin (ZITHROMAX Z-PAK) 250 MG tablet Take as directed Patient not taking: Reported on 01/03/2018 03/20/16   Jennye Moccasin, MD  chlorpheniramine-HYDROcodone [facility] PENNKINETIC ER) 10-8 MG/5ML SUER Take 5 mLs by mouth 2 (two)  times daily. Patient not taking: Reported on 01/03/2018 07/13/16   Emily Filbert, MD  lidocaine (XYLOCAINE) 2 % solution Use as directed 20 mLs in the mouth or throat as needed for mouth pain. Patient not taking: Reported on 01/03/2018 05/06/17   Darci Current, MD  magic mouthwash w/lidocaine SOLN Take 5 mLs by mouth 4 (four) times daily. Patient not taking: Reported on 01/03/2018 10/23/16   Cuthriell, Delorise Royals, PA-C  Spacer/Aero Chamber Mouthpiece MISC 1 Units by Does not apply route every 4 (four) hours as needed (wheezing). Patient not taking: Reported on 01/03/2018 12/20/17   Merrily Brittle, MD  traMADol (ULTRAM) 50 MG tablet Take 1 tablet (50 mg total) by mouth every 6 (six) hours as needed. Patient not taking: Reported on 01/03/2018 08/24/17 08/24/18  Evon Slack, PA-C      VITAL SIGNS:  Blood pressure (!) 141/109, pulse 67, temperature 98 F (36.7 C), temperature source Oral, resp. rate 18, height  (1.803 m), weight 80.6 kg (177 lb 9.6 oz), SpO2 94 %.  PHYSICAL EXAMINATION:  Physical Exam  Constitutional: He is oriented to person, place, and time. He appears well-developed and well-nourished. He is active and cooperative.  Non-toxic appearance. He does not have a sickly appearance. He does not appear ill. No distress. He is not intubated.  HENT:  Head: Normocephalic and atraumatic.  Mouth/Throat: Oropharynx is clear and moist. No oropharyngeal exudate.  Eyes: Pupils are equal, round, and reactive to light. Conjunctivae, EOM and lids are normal. No scleral icterus.  Neck: Neck supple. No JVD present. No thyromegaly present.  Cardiovascular: S1 normal and S2 normal. An irregularly irregular rhythm present. Tachycardia present. Exam reveals no gallop, no S3, no S4, no distant heart sounds and no friction rub.  No murmur heard. Pulmonary/Chest: Effort normal. No accessory muscle usage or stridor. No apnea, no tachypnea and no bradypnea. He is not intubated. No respiratory  distress. He has no decreased breath sounds. He has no wheezes. He has no rhonchi. He has no rales.  Abdominal: Soft. Bowel sounds are normal. He exhibits no distension. There is no tenderness. There is no rebound and no guarding.  Musculoskeletal: Normal range of motion. He exhibits no edema or tenderness.  Lymphadenopathy:    He has no cervical adenopathy.  Neurological: He is alert and oriented to person, place, and time. He is not disoriented.  Skin: Skin is warm, dry and intact. No rash noted. He is not diaphoretic. No erythema. No pallor.  Psychiatric: He has a normal mood and affect. His speech is normal and behavior is normal. Judgment and thought content normal. His mood appears not anxious. He is not agitated. Cognition and memory are normal.   LABORATORY PANEL:   CBC Recent Labs  Lab 01/02/18 2356  WBC 16.9*  HGB 17.1  HCT 49.2  PLT 218   ------------------------------------------------------------------------------------------------------------------  Chemistries  Recent Labs  Lab 01/02/18 2356  NA 139  K 4.0  CL 107  CO2 26  GLUCOSE 116*  BUN 12  CREATININE 0.78  CALCIUM 8.8*  MG 2.2  AST 22  ALT 17  ALKPHOS 64  BILITOT 0.5   ------------------------------------------------------------------------------------------------------------------  Cardiac Enzymes Recent Labs  Lab 01/02/18 2356  TROPONINI <0.03   ------------------------------------------------------------------------------------------------------------------  RADIOLOGY:  Ct Head Wo Contrast  Result Date: 01/03/2018 CLINICAL DATA:  Syncopal episode or seizure EXAM: CT HEAD WITHOUT CONTRAST TECHNIQUE: Contiguous axial images were obtained from the base of the skull through the vertex without intravenous contrast. COMPARISON:  08/28/2017 head CT FINDINGS: Brain: No evidence of acute infarction, hemorrhage, hydrocephalus, extra-axial collection or mass lesion/mass effect. Low lying cerebellar  tonsils. Vascular: No hyperdense vessel or unexpected calcification. Skull: Normal. Negative for fracture or focal lesion. Sinuses/Orbits: Mucosal thickening in the ethmoid sinuses. No acute orbital abnormality. Other: None IMPRESSION: Negative.  No CT evidence for acute intracranial abnormality. Electronically Signed   By: Jasmine Pang M.D.   On: 01/03/2018 01:09   Dg Chest Port 1 View  Result Date: 01/03/2018 CLINICAL DATA:  Shortness of breath and syncope EXAM: PORTABLE CHEST 1 VIEW COMPARISON:  03/19/2016 FINDINGS: The heart size and mediastinal contours are within normal limits. Both lungs are clear. The visualized skeletal structures are unremarkable. IMPRESSION: No active disease. Electronically Signed   By: Jasmine Pang M.D.   On: 01/03/2018 01:02   IMPRESSION AND PLAN:   A/P: 16M new-onset Afib, syncope, hyperglycemia, leukocytosis, SIRS, subtherapeutic Tegretol level. -Tele, cardiac monitoring -Trop-I -EKG -FSG qHS/AC -Orthostatic VS -Neuro checks -Fall precautions -CHADS-VASC 1 (HTN) -Therapeutic weight-based Lovenox /kg BID -Goal HR < 110, Metop IV PRN -Aspirin allergy (Itching?) -CXR (-) -Trop-I (-), rpt pending -Echo -(-) anemia -TSH pending -(-) EtOH/drugs -K+ > 4.0, Mag > 2.0 -U/A (-), no obvious infxn, leukocytosis possibly reactive though SIRS (+)  -Cardiology consult -Coreg for BP/HR -Cannot afford Tegretol -c/w home meds -Cardiac diet -IVF -Full code -Admission, > 2 midnights   All the records are reviewed and case discussed with ED provider. Management plans discussed with the patient, family and they are in agreement.  CODE STATUS: Full code  TOTAL TIME TAKING CARE OF THIS PATIENT: 90 minutes.    Barbaraann Rondo M.D on 01/03/2018 at 3:47 AM  Between 7am to 6pm - Pager - [phone]  After 6pm go to [email or web address] - Scientist, research (life sciences) [facility] [place] [facility]  Office  [phone]  CC: Primary care physician; Center,  [facility]   Note: This dictation was prepared with Dragon dictation along with smaller phrase technology. Any transcriptional errors that result from this process are unintentional.

## 2018-01-03 NOTE — Care Management Note (Addendum)
Case Management Note  Patient Details  Name: Jeremy Yoder MRN: [id number] Date of Birth: 06-Jun-1981  Subjective/Objective:                  Admitted to [facility] with the diagnosis of new onset atrial fibrillation. Lives with wife, Natalia Leatherwood.  Mother is Elease Hashimoto [phone]). [facility] is listed as where [name] goes for physician follow-up care. Gets prescriptions filled at [facility].  States he hasn't been their in a while. Also,  No insurance listed.  Lives in [place].   Action/Plan: [facility] and Medication Management application given.  Received referral for starting Xarelto. Pharmacy gave coupon and samples.   Expected Discharge Date:                  Expected Discharge Plan:     In-House Referral:   yes  Discharge planning Services   yes  Post Acute Care Choice:    Choice offered to:     DME Arranged:    DME Agency:     HH Arranged:    HH Agency:     Status of Service:     If discussed at [employer] of Stay Meetings, dates discussed:    Additional Comments:  Gwenette Greet, RN MSN CCM Care Management [phone] 01/03/2018, 11:02 AM

## 2018-01-03 NOTE — Progress Notes (Signed)
ANTICOAGULATION CONSULT NOTE - Initial Consult  Pharmacy Consult for enoxaparin Indication: new onset atrial fibrillation  Allergies  Allergen Reactions  . Aspirin Itching  . Ibuprofen Itching    Patient Measurements: Height:  (180.3 cm) Weight: 177 lb 9.6 oz (80.6 kg) IBW/kg (Calculated) : 75.3  Vital Signs: Temp: 98 F (36.7 C) (05/23 0326) Temp Source: Oral (05/23 0326) BP: 141/109 (05/23 0326) Pulse Rate: 67 (05/23 0326)  Labs: Recent Labs    01/02/18 2356  HGB 17.1  HCT 49.2  PLT 218  CREATININE 0.78  TROPONINI <0.03    Estimated Creatinine Clearance: 136 mL/min (by C-G formula based on SCr of 0.78 mg/dL).   Medical History: Past Medical History:  Diagnosis Date  . Hyperlipidemia   . Hypertension   . Seizures (HCC)     Medications:  Scheduled:  . carbamazepine  200 mg Oral BID  . carvedilol  3.125 mg Oral BID WC  . enoxaparin (LOVENOX) injection  1 mg/kg Subcutaneous Q12H    Assessment: Patient admitted d/t HA w/ abdominal pain, emesis and syncopal episode. Patient has a h/o seizures and is on carbamazepine. On EKG shows Afib w/ RVR; w/o h/o prior afib. CHADS-VASc = 1 (patient is also a smoker)  Goal of Therapy:  Anti-Xa level 0.6-1 units/ml 4hrs after LMWH dose given Monitor platelets by anticoagulation protocol: Yes   Plan:  Will start patient on enoxaparin 80 mg bid (1 mg/kg q12h) Baseline labs drawn (aPTT, PT/INR, baseline anti-Xa) Will monitor daily CBC and BMP for renal function. Will monitor for any signs/sx of bleeding.  Thomasene Ripple, PharmD, BCPS Clinical Pharmacist 01/03/2018

## 2018-01-03 NOTE — ED Provider Notes (Signed)
[facility] Emergency Department Provider Note  ____________________________________________   None    (approximate)  I have reviewed the triage vital signs and the nursing notes.   HISTORY  Chief Complaint Headache; Emesis; and Abdominal Pain   HPI Jeremy Yoder is a 37 y.o. male who self presents to the emergency department after a syncopal event.  [place] and stood up to walk and the next thing he knew he was waking up on the ground.  He has no history of cardiac issues.  No family history of sudden cardiac death.  He does have a history of seizures and is noncompliant with medications.  He denies bowel or bladder incontinence or hesitance.  He was not postictal according to family.  He currently is reporting generalized headache, abdominal pain, nausea, and he has vomited once.  His symptoms came on suddenly went away largely quickly on their own.  They are exacerbated by walking.  Past Medical History:  Diagnosis Date  . Hypertension    a. Noncompliant w/ meds.  . Seizures (HCC)    a. Dx in teens. Noncompliant w/ meds.  . Tobacco abuse     Patient Active Problem List   Diagnosis Date Noted  . New onset atrial fibrillation (HCC) 01/03/2018  . Essential hypertension 01/03/2018  . Seizure disorder (HCC) 01/03/2018    Past Surgical History:  Procedure Laterality Date  . arm    . FOREARM SURGERY    . KNEE SURGERY      Prior to Admission medications   Medication Sig Start Date End Date Taking? Authorizing Provider  levETIRAcetam (KEPPRA) 500 MG tablet Take 1 tablet (500 mg total) by mouth 2 (two) times daily. 01/03/18   Altamese Dilling, MD  metoprolol succinate (TOPROL-XL) 50 MG 24 hr tablet Take 1 tablet (50 mg total) by mouth daily. Take with or immediately following a meal. 01/04/18   Altamese Dilling, MD  rivaroxaban (XARELTO) 20 MG TABS tablet Take 1 tablet (20 mg total) by mouth  daily. 01/03/18   Altamese Dilling, MD    Allergies Aspirin and Ibuprofen  Family History  Problem Relation Age of Onset  . Hypertension Mother   . Diabetes Mother   . Atrial fibrillation Mother   . Heart disease Father        Pt doesn't know much about father's history but says he has either a PPM or ICD.    Social History Social History   Tobacco Use  . Smoking status: Current Every Day Smoker    Packs/day: 0.50    Years: 20.00    Pack years: 10.00  . Smokeless tobacco: Never Used  . Tobacco comment: currently smoking 0.5 - 1 ppd.  Substance Use Topics  . Alcohol use: No  . Drug use: No    Review of Systems Constitutional: No fever/chills Eyes: No visual changes. ENT: No sore throat. Cardiovascular: Denies chest pain. Respiratory: Denies shortness of breath. Gastrointestinal: Positive for abdominal pain.  Positive for nausea,  positive for vomiting.  No diarrhea.  No constipation. Genitourinary: Negative for dysuria. Musculoskeletal: Negative for back pain. Skin: Negative for rash. Neurological: Positive for headache.   ____________________________________________   PHYSICAL EXAM:  VITAL SIGNS: ED Triage Vitals  Enc Vitals Group     BP 01/02/18 2344 127/86     Pulse Rate 01/02/18 2344 99     Resp 01/02/18 2344 18     Temp 01/02/18 2344 98.5  F (36.9 C)     Temp Source 01/02/18 2344 Oral     SpO2 01/02/18 2344 97 %     Weight 01/02/18 2352 175 lb (79.4 kg)     Height 01/02/18 2352 6' (1.829 m)     Head Circumference --      Peak Flow --      Pain Score 01/02/18 2352 9     Pain Loc --      Pain Edu? --      Excl. in GC? --     Constitutional: Alert and oriented x4 appears tired and sunburnt Eyes: PERRL EOMI. midrange and brisk  Head: Atraumatic. Nose: No congestion/rhinnorhea. Mouth/Throat: No trismus no bites to his tongue Neck: No stridor.   Cardiovascular: Irregularly irregular and tachycardic Respiratory: Normal respiratory effort.   No retractions. Lungs CTAB and moving good air Gastrointestinal: Soft nontender Musculoskeletal: No lower extremity edema   Neurologic:  Normal speech and language. No gross focal neurologic deficits are appreciated. Skin:  Skin is warm, dry and intact. No rash noted. Psychiatric: Mood and affect are normal. Speech and behavior are normal.    ____________________________________________   DIFFERENTIAL includes but not limited to  Cardiogenic syncope, vasovagal syncope, dehydration, seizure ____________________________________________   LABS (all labs ordered are listed, but only abnormal results are displayed)  Labs Reviewed  COMPREHENSIVE METABOLIC PANEL - Abnormal; Notable for the following components:      Result Value   Glucose, Bld 116 (*)    Calcium 8.8 (*)    All other components within normal limits  CBC - Abnormal; Notable for the following components:   WBC 16.9 (*)    All other components within normal limits  URINALYSIS, COMPLETE (UACMP) WITH MICROSCOPIC - Abnormal; Notable for the following components:   Color, Urine YELLOW (*)    APPearance CLEAR (*)    All other components within normal limits  CARBAMAZEPINE LEVEL, TOTAL - Abnormal; Notable for the following components:   Carbamazepine Lvl <2.0 (*)    All other components within normal limits  APTT - Abnormal; Notable for the following components:   aPTT 38 (*)    All other components within normal limits  HEPARIN LEVEL (UNFRACTIONATED) - Abnormal; Notable for the following components:   Heparin Unfractionated <0.10 (*)    All other components within normal limits  BASIC METABOLIC PANEL - Abnormal; Notable for the following components:   Glucose, Bld 108 (*)    Calcium 8.3 (*)    All other components within normal limits  CBC - Abnormal; Notable for the following components:   WBC 15.3 (*)    All other components within normal limits  LIPASE, BLOOD  ETHANOL  MAGNESIUM  TROPONIN I  HIV ANTIBODY  (ROUTINE TESTING)  THYROID PANEL WITH TSH  TROPONIN I  PROTIME-INR  GLUCOSE, CAPILLARY  GLUCOSE, CAPILLARY    Lab work reviewed by me with no acute disease no signs of ischemia __________________________________________  EKG  ED ECG REPORT I, Merrily Brittle, the attending physician, personally viewed and interpreted this ECG. Date: 01/02/2018 EKG Time:  Rate: 113 Rhythm: Atrial fibrillation with rapid ventricular response QRS Axis: normal Intervals: normal ST/T Wave abnormalities: normal Narrative Interpretation: no evidence of acute ischemia  ____________________________________________  RADIOLOGY  Chest x-ray reviewed by me with no acute disease Head CT reviewed by me with no acute disease ____________________________________________   PROCEDURES  Procedure(s) performed: no  Procedures  Critical Care performed: no  Observation: no ____________________________________________   INITIAL IMPRESSION /  ASSESSMENT AND PLAN / ED COURSE  Pertinent labs & imaging results that were available during my care of the patient were reviewed by me and considered in my medical decision making (see chart for details).  The patient arrives relatively well-appearing although is clearly in atrial fibrillation to the 1 teens.  On chart review this is new and he has never had an arrhythmia before.  This raises concern for cardiac etiology of his syncope.  Head CT performed given unclear start of his atrial fibrillation and the possibility of stroke.  Fortunately is negative.  He is neuro intact at this point.  Given the possibility of a cardiac etiology of his syncope he requires inpatient admission for telemetry and likely echocardiogram and possible cardioversion.  I discussed with the patient and family verbalized understanding and agreement with the plan.  I then discussed with the hospitalist who has graciously agreed to admit the patient to their service.      ____________________________________________   FINAL CLINICAL IMPRESSION(S) / ED DIAGNOSES  Final diagnoses:  Syncope, cardiogenic  Atrial fibrillation, unspecified type (HCC)  Dehydration      NEW MEDICATIONS STARTED DURING THIS VISIT:  Discharge Medication List as of 01/03/2018  3:39 PM    START taking these medications   Details  levETIRAcetam (KEPPRA) 500 MG tablet Take 1 tablet (500 mg total) by mouth 2 (two) times daily., Starting Thu 01/03/2018, Print    metoprolol succinate (TOPROL-XL) 50 MG 24 hr tablet Take 1 tablet (50 mg total) by mouth daily. Take with or immediately following a meal., Starting Fri 01/04/2018, Print    rivaroxaban (XARELTO) 20 MG TABS tablet Take 1 tablet (20 mg total) by mouth daily., Starting Thu 01/03/2018, Print         Note:  This document was prepared using Dragon voice recognition software and may include unintentional dictation errors.     Merrily Brittle, MD 01/04/18 2252

## 2018-01-03 NOTE — ED Notes (Signed)
Pt states he does not want anymore magnesium because it makes him feel bad. Attempted to give slower pt refused. Pt asked when he could go home. Stated he would be admitted. Pt states he doesn't want to be admitted. Explained it was his decision. Dr Lamont Snowball notified.

## 2018-01-04 LAB — THYROID PANEL WITH TSH
Free Thyroxine Index: 1.9 (ref 1.2–4.9)
T3 Uptake Ratio: 27 % (ref 24–39)
T4 TOTAL: 7.2 ug/dL (ref 4.5–12.0)
TSH: 2.51 u[IU]/mL (ref 0.450–4.500)

## 2018-01-04 LAB — GLUCOSE, CAPILLARY: Glucose-Capillary: 96 mg/dL (ref 65–99)

## 2018-01-04 LAB — HIV ANTIBODY (ROUTINE TESTING W REFLEX): HIV SCREEN 4TH GENERATION: NONREACTIVE

## 2018-01-10 NOTE — Telephone Encounter (Signed)
No answer. Left message to call back.   

## 2018-01-11 NOTE — Telephone Encounter (Signed)
No answer. Left message to call back.   

## 2018-01-15 NOTE — Telephone Encounter (Signed)
No answer. Left message to call back.   

## 2018-01-23 ENCOUNTER — Ambulatory Visit: Payer: Self-pay | Admitting: Cardiovascular Disease

## 2018-01-24 ENCOUNTER — Encounter: Payer: Self-pay | Admitting: Cardiovascular Disease

## 2018-01-24 ENCOUNTER — Ambulatory Visit (INDEPENDENT_AMBULATORY_CARE_PROVIDER_SITE_OTHER): Payer: Self-pay | Admitting: Cardiovascular Disease

## 2018-01-24 VITALS — BP 158/109 | HR 86 | Ht 71.0 in | Wt 176.0 lb

## 2018-01-24 DIAGNOSIS — I480 Paroxysmal atrial fibrillation: Secondary | ICD-10-CM

## 2018-01-24 DIAGNOSIS — I10 Essential (primary) hypertension: Secondary | ICD-10-CM

## 2018-01-24 DIAGNOSIS — G40909 Epilepsy, unspecified, not intractable, without status epilepticus: Secondary | ICD-10-CM

## 2018-01-24 DIAGNOSIS — I4891 Unspecified atrial fibrillation: Secondary | ICD-10-CM

## 2018-01-24 MED ORDER — METOPROLOL SUCCINATE ER 50 MG PO TB24: 50.0000 mg | ORAL_TABLET | Freq: Every day | ORAL | 11 refills | Status: DC

## 2018-01-24 MED ORDER — LEVETIRACETAM 500 MG PO TABS: 500.0000 mg | ORAL_TABLET | Freq: Two times a day (BID) | ORAL | 11 refills | Status: DC

## 2018-01-24 NOTE — Patient Instructions (Addendum)
Search for "pulse meter" "instant heart rate" Put left hand second finger over the flash and camrea   Medication Instructions:  Your physician has recommended you make the following change in your medication:  1. AFTER Xarelto START Eliquis 5 mg twice daily until box is gone.  Stay on the metoprolol (once a day) and keppra (twice a day) Medication Samples have been provided to the patient.  Drug name: Eliquis       Strength: 5 mg        Qty: 1 box  LOT: [id number]  Exp.Date: Jun 2021   Labwork:  No new labs needed  Testing/Procedures:  No further testing at this time   Follow-Up: It was a pleasure seeing you in the office today. Please call us if you have new issues that need to be addressed before your next appt.  [phone]  Your physician wants you to follow-up in: 6 months.  You will receive a reminder letter in the mail two months in advance. If you don't receive a letter, please call our office to schedule the follow-up appointment.  If you need a refill on your cardiac medications before your next appointment, please call your pharmacy.  For educational health videos Log in to : [email or web address] Or : [email or web address], password : [email or web address]

## 2018-01-24 NOTE — Progress Notes (Signed)
Cardiology Office Note  Date:  01/24/2018   ID:  ESIAS MORY, DOB 1981-03-13, MRN [id number]  PCP:  [facility]   Chief Complaint  Patient presents with  . OTHER    Orthostatic BP no complaints today. Meds reviewed verbally with pt.    HPI:  4 your gentleman with no prior cardiac history,  History of smoking and prior seizure disorder,  Noncompliant with his medications Presenting after several episodes of nausea and vomiting after barbecue Syncope /Seizure at home, wife encouraged him to present to the emergency room He presents today After recent discharge from the hospital, noted to have atrial fibrillation with RVR  Presented to the hospital 01/03/2018 Reported symptoms of headache, abdominal pain generalized,emesis "Larey Seat out of bed woke up on the ground" Reported he was at the [employer] all day in the heat Wife witnessed him become unresponsive for 30 seconds after try to get out of bed, fell back into the bed  In the emergency room noted to be in atrial fibrillation with RVR,  Elevated WBC 16.9 Head CT negative Initially started on carvedilol Lovenox  Did not want to stay for TEE and cardioversion Had to get home to take care of 4 children, wife is working  In follow-up today he reports that he feels well with no complaints  denies any tachycardia or palpitations Has only several days of his medications left Family helps him to buy his medications Currently unemployed, applying for disability  EKG personally reviewed by myself on todays visit Shows normal sinus rhythm with rate 86 bpm no significant ST or T-wave changes   PMH:   has a past medical history of Hypertension, Seizures (HCC), and Tobacco abuse.  PSH:    Past Surgical History:  Procedure Laterality Date  . arm    . FOREARM SURGERY    . KNEE SURGERY      Current Outpatient Medications  Medication Sig Dispense Refill  . levETIRAcetam (KEPPRA) 500 MG tablet Take 1 tablet (500 mg  total) by mouth 2 (two) times daily. 60 tablet 11  . metoprolol succinate (TOPROL-XL) 50 MG 24 hr tablet Take 1 tablet (50 mg total) by mouth daily. Take with or immediately following a meal. 30 tablet 11  . rivaroxaban (XARELTO) 20 MG TABS tablet Take 1 tablet (20 mg total) by mouth daily. 30 tablet 1   No current facility-administered medications for this visit.      Allergies:   Aspirin and Ibuprofen   Social History:  The patient  reports that he has been smoking.  He has a 10.00 pack-year smoking history. He has never used smokeless tobacco. He reports that he does not drink alcohol or use drugs.   Family History:   family history includes Atrial fibrillation in his mother; Diabetes in his mother; Heart disease in his father; Hypertension in his mother.    Review of Systems: Review of Systems  Constitutional: Negative.   Respiratory: Negative.   Cardiovascular: Negative.   Gastrointestinal: Negative.   Musculoskeletal: Negative.        Limited range of motion right arm  Neurological: Negative.   Psychiatric/Behavioral: Negative.   All other systems reviewed and are negative.    PHYSICAL EXAM: VS:  BP (!) 158/109 (BP Location: Left Arm, Patient Position: Sitting, Cuff Size: Normal)   Pulse 86   Ht 5\' 11"  (1.803 m)   Wt 176 lb (79.8 kg)   BMI 24.55 kg/m  , BMI Body mass index is 24.55  kg/m. GEN: Well nourished, well developed, in no acute distress  HEENT: normal  Neck: no JVD, carotid bruits, or masses Cardiac: RRR; no murmurs, rubs, or gallops,no edema  Respiratory:  clear to auscultation bilaterally, normal work of breathing GI: soft, nontender, nondistended, + BS MS: deformity right arm, previous surgical scars, unable to extend his fingers Skin: warm and dry, no rash Neuro:  Strength and sensation are intact Psych: euthymic mood, full affect    Recent Labs: 01/02/2018: ALT 17; Magnesium 2.2 01/03/2018: BUN 9; Creatinine, Ser 0.62; Hemoglobin 15.7; Platelets  227; Potassium 4.1; Sodium 139; TSH 2.510    Lipid Panel No results found for: CHOL, HDL, LDLCALC, TRIG    Wt Readings from Last 3 Encounters:  01/24/18 176 lb (79.8 kg)  01/03/18 177 lb 9.6 oz (80.6 kg)  12/20/17 175 lb (79.4 kg)       ASSESSMENT AND PLAN:  Paroxysmal atrial fibrillation (HCC) - Plan: EKG 12-Lead In the setting of gastroenteritis leading to vasovagal syncope Discharged from the hospital in atrial fibrillation, converting to normal sinus rhythm Recommended he complete his xarelto, we do not have any additional samples to provide him One additional week of eliquis 5 mill grams twice a day provided to finish one month We'll continue metoprolol succinate 50 daily  Essential hypertension Continue metoprolol as above  Seizure disorder (HCC) Reestablishing with primary care Numbers provided [facility]. Reports it was $120 We have refilled his Keppra until he is able to find a primary care  He has indicated he would like one more visit to make sure everything is okay Disposition:   F/U  6 months   Total encounter time more than 15 minutes  Greater than 50% was spent in counseling and coordination of care with the patient    Orders Placed This Encounter  Procedures  . EKG 12-Lead     Signed, Dossie Arbourim Cassandra Mcmanaman, M.D., Ph.D. 01/24/2018  [facility], [place] [phone]

## 2018-01-29 ENCOUNTER — Ambulatory Visit: Payer: Medicaid Other | Admitting: Cardiovascular Disease

## 2018-03-06 ENCOUNTER — Encounter: Payer: Self-pay | Admitting: Emergency Medicine

## 2018-03-06 ENCOUNTER — Other Ambulatory Visit: Payer: Self-pay

## 2018-03-06 ENCOUNTER — Emergency Department
Admission: EM | Admit: 2018-03-06 | Discharge: 2018-03-06 | Disposition: A | Discharge status: Home / Self Care | Payer: Medicaid Other | Attending: Emergency Medicine | Admitting: Emergency Medicine

## 2018-03-06 DIAGNOSIS — K0889 Other specified disorders of teeth and supporting structures: Secondary | ICD-10-CM | POA: Insufficient documentation

## 2018-03-06 DIAGNOSIS — I10 Essential (primary) hypertension: Secondary | ICD-10-CM | POA: Insufficient documentation

## 2018-03-06 DIAGNOSIS — Z79899 Other long term (current) drug therapy: Secondary | ICD-10-CM | POA: Insufficient documentation

## 2018-03-06 DIAGNOSIS — F17200 Nicotine dependence, unspecified, uncomplicated: Secondary | ICD-10-CM | POA: Diagnosis not present

## 2018-03-06 MED ORDER — CHLORHEXIDINE GLUCONATE 0.12 % MT SOLN: 15.0000 mL | Freq: Two times a day (BID) | OROMUCOSAL | 0 refills | Status: DC

## 2018-03-06 MED ORDER — TRAMADOL HCL 50 MG PO TABS
50.0000 mg | ORAL_TABLET | Freq: Once | ORAL | Status: AC
  Administered 2018-03-06: 50 mg via ORAL
  Filled 2018-03-06: qty 1

## 2018-03-06 MED ORDER — TRAMADOL HCL 50 MG PO TABS: 50.0000 mg | ORAL_TABLET | Freq: Four times a day (QID) | ORAL | 0 refills | Status: DC | PRN

## 2018-03-06 MED ORDER — AMOXICILLIN 500 MG PO TABS: 500.0000 mg | ORAL_TABLET | Freq: Three times a day (TID) | ORAL | 0 refills | Status: DC

## 2018-03-06 NOTE — ED Triage Notes (Signed)
Patient reports pain in mouth x3 days. Increased pain with eating and swallowing. History of dental infections with similar symptoms.

## 2018-03-06 NOTE — Discharge Instructions (Signed)
Please call and schedule a dental appointment as soon as possible. You will need to be seen within the next 14 days. Return to the emergency department for symptoms that change or worsen if you're unable to schedule an appointment.  OPTIONS FOR DENTAL FOLLOW UP CARE  [place] Department of Health and Human Services - Local Safety Net Dental Clinics http://www.ncdhhs.gov/dph/oralhealth/services/safetynetclinics.htm   [facility] ([phone])  [facility] ([phone])  [facility] ([phone])  [facility] ([phone])  [facility] ([phone]) This clinic caters to the indigent population and is on a lottery system. Location: [facility], [address], [place] Clinic Hours: Wednesdays from 6pm - 9pm, patients seen by a lottery system. For dates, call or go to [email or web address].[facility].edu/[facility]/patients/[email or web address]-[facility] Services: Cleanings, fillings and simple extractions. Payment Options: DENTAL WORK IS FREE OF CHARGE. Bring proof of income or support. Best way to get seen: Arrive at 5:15 pm - this is a lottery, NOT first come/first serve, so arriving earlier will not increase your chances of being seen.     [facility] [phone] Select option 1 for emergencies   Location: [facility], [address], [place] Clinic Hours: No walk-ins accepted - call the day before to schedule an appointment. Check in times are 9:30 am and 1:30 pm. Services: Simple extractions, temporary fillings, pulpectomy/pulp debridement, uncomplicated abscess drainage. Payment Options: PAYMENT IS DUE AT THE TIME OF SERVICE.  Fee is usually $100-200, additional surgical procedures (e.g. abscess drainage) may be extra. Cash, checks, Visa/MasterCard accepted.  Can file Medicaid if patient is covered for dental - patient should call case worker to check. No  discount for [facility] patients. Best way to get seen: MUST call the day before and get onto the schedule. Can usually be seen the next 1-2 days. No walk-ins accepted.     [facility] [phone]   Location: [facility], [address], [place] Clinic Hours: M, W, Th, F 8am or 1:30pm, Tues 9a or 1:30 - first come/first served. Services: Simple extractions, temporary fillings, uncomplicated abscess drainage.  You do not need to be an [place] resident. Payment Options: PAYMENT IS DUE AT THE TIME OF SERVICE. Dental insurance, otherwise sliding scale - bring proof of income or support. Depending on income and treatment needed, cost is usually $50-200. Best way to get seen: Arrive early as it is first come/first served.     [facility] [phone]   Location: [address] Clinic Hours: Mon-Thu 8a-5p Services: Most basic dental services including extractions and fillings. Payment Options: PAYMENT IS DUE AT THE TIME OF SERVICE. Sliding scale, up to 50% off - bring proof if income or support. Medicaid with dental option accepted. Best way to get seen: Call to schedule an appointment, can usually be seen within 2 weeks OR they will try to see walk-ins - show up at 8a or 2p (you may have to wait).     [facility] [phone] [place] RESIDENTS ONLY   Location: [facility], [address], [place], [place] [address] Clinic Hours: By appointment only. Monday - Thursday 8am-5pm, Friday 8am-12pm Services: Cleanings, fillings, extractions. Payment Options: PAYMENT IS DUE AT THE TIME OF SERVICE. Cash, Visa or MasterCard. Sliding scale - $30 minimum per service. Best way to get seen: Come in to office, complete packet and make an appointment -   need proof of income or support monies for each household member and proof of [place]  residence. Usually takes about a month to get in.     [facility] [phone]   Location: [address] [place] Clinic Hours: Walk-in Urgent Care Dental Services are offered Monday-Friday mornings only. The numbers of emergencies accepted daily is limited to the number of providers available. Maximum 15 - Mondays, Wednesdays & Thursdays Maximum 10 - Tuesdays & Fridays Services: You do not need to be a [place] resident to be seen for a dental emergency. Emergencies are defined as pain, swelling, abnormal bleeding, or dental trauma. Walkins will receive x-rays if needed. NOTE: Dental cleaning is not an emergency. Payment Options: PAYMENT IS DUE AT THE TIME OF SERVICE. Minimum co-pay is $40.00 for uninsured patients. Minimum co-pay is $3.00 for Medicaid with dental coverage. Dental Insurance is accepted and must be presented at time of visit. Medicare does not cover dental. Forms of payment: Cash, credit card, checks. Best way to get seen: If not previously registered with the clinic, walk-in dental registration begins at 7:15 am and is on a first come/first serve basis. If previously registered with the clinic, call to make an appointment.     [facility] [phone] [place] RESIDENTS ONLY   Location: [address], [place], [place] Clinic Hours: Mon-Thu 10a-2p Services: Extractions only! Payment Options: FREE (donations accepted) - bring proof of income or support Best way to get seen: Call and schedule an appointment OR come at 8am on the 1st Monday of every month (except for holidays) when it is first come/first served.     [facility] [phone]   Location: [address], [place] Clinic Hours: Friday mornings Services, Payment Options, Best way to get seen: Call for info  

## 2018-03-06 NOTE — ED Provider Notes (Signed)
[facility] Emergency Department Provider Note ____________________________________________  Time seen: Approximately 6:50 PM  I have reviewed the triage vital signs and the nursing notes.   HISTORY  Chief Complaint Dental Pain   HPI Jeremy Yoder is a 37 y.o. male who presents to the emergency department for treatment and evaluation of dental pain that started 3 days ago.  Pain increases with eating and drinking fluids of any temperature.  He has a history of the same, but has been unable to schedule an appointment with a dentist due to lack of insurance and ability to afford it.  He denies fever.   Past Medical History:  Diagnosis Date  . Hypertension    a. Noncompliant w/ meds.  . Seizures (HCC)    a. Dx in teens. Noncompliant w/ meds.  . Tobacco abuse     Patient Active Problem List   Diagnosis Date Noted  . New onset atrial fibrillation (HCC) 01/03/2018  . Essential hypertension 01/03/2018  . Seizure disorder (HCC) 01/03/2018    Past Surgical History:  Procedure Laterality Date  . arm    . FOREARM SURGERY    . KNEE SURGERY      Prior to Admission medications   Medication Sig Start Date End Date Taking? Authorizing Provider  amoxicillin (AMOXIL) 500 MG tablet Take 1 tablet (500 mg total) by mouth 3 (three) times daily. 03/06/18   Novi Calia, Rulon Eisenmenger B, FNP  chlorhexidine (PERIDEX) 0.12 % solution Use as directed 15 mLs in the mouth or throat 2 (two) times daily. 03/06/18   Jeromie Gainor, Rulon Eisenmenger B, FNP  levETIRAcetam (KEPPRA) 500 MG tablet Take 1 tablet (500 mg total) by mouth 2 (two) times daily. 01/24/18   Antonieta Iba, MD  metoprolol succinate (TOPROL-XL) 50 MG 24 hr tablet Take 1 tablet (50 mg total) by mouth daily. Take with or immediately following a meal. 01/24/18   Gollan, Tollie Pizza, MD  rivaroxaban (XARELTO) 20 MG TABS tablet Take 1 tablet (20 mg total) by mouth daily. 01/03/18   Altamese Dilling, MD  traMADol (ULTRAM) 50 MG tablet Take 1  tablet (50 mg total) by mouth every 6 (six) hours as needed. 03/06/18   Santrice Muzio, Rulon Eisenmenger B, FNP    Allergies Aspirin and Ibuprofen  Family History  Problem Relation Age of Onset  . Hypertension Mother   . Diabetes Mother   . Atrial fibrillation Mother   . Heart disease Father        Pt doesn't know much about father's history but says he has either a PPM or ICD.    Social History Social History   Tobacco Use  . Smoking status: Current Every Day Smoker    Packs/day: 0.50    Years: 20.00    Pack years: 10.00  . Smokeless tobacco: Never Used  . Tobacco comment: currently smoking 0.5 - 1 ppd.  Substance Use Topics  . Alcohol use: No  . Drug use: No    Review of Systems Constitutional: Negative for fever or recent illness. ENT: Positive for dental pain. Musculoskeletal: Negative for trismus of the jaw.  Skin: Negative for wound or lesion. ____________________________________________   PHYSICAL EXAM:  VITAL SIGNS: ED Triage Vitals  Enc Vitals Group     BP 03/06/18 1717 (!) 176/100     Pulse Rate 03/06/18 1717 63     Resp 03/06/18 1717 16     Temp 03/06/18 1717 98.2 F (36.8 C)     Temp Source 03/06/18 1717 Oral  SpO2 03/06/18 1717 95 %     Weight 03/06/18 1714 180 lb (81.6 kg)     Height 03/06/18 1714 5\' 11"  (1.803 m)     Head Circumference --      Peak Flow --      Pain Score 03/06/18 1714 9     Pain Loc --      Pain Edu? --      Excl. in GC? --     Constitutional: Alert and oriented. Well appearing and in no acute distress. Eyes: Conjunctiva are clear without discharge or drainage. Mouth/Throat: Airway is patent. Periodontal Exam    Hematological/Lymphatic/Immunilogical: No palpable adenopathy in the anterior cervical chain. Respiratory: Respirations even and unlabored. Musculoskeletal: Full ROM of the jaw. Neurologic: Awake, alert, oriented.  Skin: No facial erythema or swelling Psychiatric: Affect and behavior  intact.  ____________________________________________   LABS (all labs ordered are listed, but only abnormal results are displayed)  Labs Reviewed - No data to display ____________________________________________   RADIOLOGY  Not indicated. ____________________________________________   PROCEDURES  Procedure(s) performed:   Procedures  Critical Care performed: No ____________________________________________   INITIAL IMPRESSION / ASSESSMENT AND PLAN / ED COURSE  Jeremy Yoder is a 37 y.o. male who presents to the emergency department for treatment and evaluation of dental pain.  He will be treated with amoxicillin and tramadol.  He was given an extensive list of dental clinics that will see him based on a sliding scale.  He is to call tomorrow to find someone with an available appointment within 2 weeks.  He was instructed to return to the emergency department for symptoms of change or worsen if unable to schedule an appointment.  Pertinent labs & imaging results that were available during my care of the patient were reviewed by me and considered in my medical decision making (see chart for details).  ____________________________________________   FINAL CLINICAL IMPRESSION(S) / ED DIAGNOSES  Final diagnoses:  Pain, dental    Discharge Medication List as of 03/06/2018  5:46 PM    START taking these medications   Details  amoxicillin (AMOXIL) 500 MG tablet Take 1 tablet (500 mg total) by mouth 3 (three) times daily., Starting Wed 03/06/2018, Normal    chlorhexidine (PERIDEX) 0.12 % solution Use as directed 15 mLs in the mouth or throat 2 (two) times daily., Starting Wed 03/06/2018, Normal    traMADol (ULTRAM) 50 MG tablet Take 1 tablet (50 mg total) by mouth every 6 (six) hours as needed., Starting Wed 03/06/2018, Normal        If controlled substance prescribed during this visit, 12 month history viewed on the [facility] prior to issuing an initial prescription for  Schedule II or III opiod.  Note:  This document was prepared using Dragon voice recognition software and may include unintentional dictation errors.     Chinita Pesterriplett, Zakirah Weingart B, FNP 03/06/18 Lynelle [id number]    Dionne BucySiadecki, Sebastian, MD 03/07/18 0002

## 2018-03-19 ENCOUNTER — Emergency Department
Admission: EM | Admit: 2018-03-19 | Discharge: 2018-03-20 | Disposition: A | Discharge status: Home / Self Care | Payer: Medicaid Other | Attending: Emergency Medicine | Admitting: Emergency Medicine

## 2018-03-19 ENCOUNTER — Encounter: Payer: Self-pay | Admitting: Emergency Medicine

## 2018-03-19 ENCOUNTER — Other Ambulatory Visit: Payer: Self-pay

## 2018-03-19 DIAGNOSIS — I10 Essential (primary) hypertension: Secondary | ICD-10-CM | POA: Diagnosis not present

## 2018-03-19 DIAGNOSIS — K0889 Other specified disorders of teeth and supporting structures: Secondary | ICD-10-CM | POA: Insufficient documentation

## 2018-03-19 DIAGNOSIS — F17200 Nicotine dependence, unspecified, uncomplicated: Secondary | ICD-10-CM | POA: Insufficient documentation

## 2018-03-19 DIAGNOSIS — R569 Unspecified convulsions: Secondary | ICD-10-CM | POA: Insufficient documentation

## 2018-03-19 DIAGNOSIS — Z79899 Other long term (current) drug therapy: Secondary | ICD-10-CM | POA: Insufficient documentation

## 2018-03-19 DIAGNOSIS — K089 Disorder of teeth and supporting structures, unspecified: Secondary | ICD-10-CM

## 2018-03-19 DIAGNOSIS — G8929 Other chronic pain: Secondary | ICD-10-CM | POA: Diagnosis not present

## 2018-03-19 MED ORDER — METOPROLOL SUCCINATE ER 50 MG PO TB24
50.0000 mg | ORAL_TABLET | ORAL | Status: AC
  Administered 2018-03-19: 50 mg via ORAL
  Filled 2018-03-19: qty 1

## 2018-03-19 MED ORDER — ACETAMINOPHEN 500 MG PO TABS
1000.0000 mg | ORAL_TABLET | Freq: Once | ORAL | Status: AC
  Administered 2018-03-19: 1000 mg via ORAL
  Filled 2018-03-19: qty 2

## 2018-03-19 MED ORDER — METOPROLOL SUCCINATE ER 50 MG PO TB24: 50.0000 mg | ORAL_TABLET | Freq: Every day | ORAL | 11 refills | Status: DC

## 2018-03-19 MED ORDER — LEVETIRACETAM 500 MG PO TABS
500.0000 mg | ORAL_TABLET | Freq: Once | ORAL | Status: AC
  Administered 2018-03-19: 500 mg via ORAL
  Filled 2018-03-19: qty 1

## 2018-03-19 MED ORDER — LEVETIRACETAM 500 MG PO TABS: 500.0000 mg | ORAL_TABLET | Freq: Two times a day (BID) | ORAL | 11 refills | Status: DC

## 2018-03-19 NOTE — ED Triage Notes (Addendum)
Pt presents to ED in wheelchair from visitors entrance. Pt family reports they were going to visit someone when pt was unresponsive and "zoned out for several seconds" two different times. family concerned he had a seizure. hx of the same. Pt also c/o chest pain earlier but not currently. Pt reports he was told to stop taking his seizure medication a couple of months ago. Pt states "I feel fine. I dont even want to be here".

## 2018-03-19 NOTE — ED Provider Notes (Addendum)
[facility] Emergency Department Provider Note  ____________________________________________   First MD Initiated Contact with Patient 03/19/18 2328     (approximate)  I have reviewed the triage vital signs and the nursing notes.   HISTORY  Chief Complaint Seizures    HPI Jeremy Yoder is a 37 y.o. male with medical history that includes seizure disorder, hypertension, persistent tobacco use, chronic dental plate, and nonadherence to medication regimen thought to be due to financial issues.  He presents by private vehicle for evaluation of possible seizure disorder.  His family was worried about him because he was coming to the hospital to see his mother, who is currently in the ICU, and while he was apparently in the waiting room he "zoned out" for a few seconds.  He says he does not remember doing this and has no complaints other than chronic dental pain on the right back teeth which is been ongoing for him for months.  He says he does not know if he sees or not.  He stopped taking his seizure medicine months ago in spite of it being refilled by Dr. Mariah Milling; he had a cardiology visit about 2-1/2 months ago after a brief admission for A. fib with RVR and Dr. Mariah Milling provided refill prescriptions for his blood pressure medicine, metoprolol succinate 50 mg by mouth daily, and his seizure medicine, Keppra 500 mg by mouth twice a day.  He says he has not been taking either of these for an extended period of time and claims that he does not have the prescriptions (although the medical record indicates that Dr. Mariah Milling gave him a prescription and 11 refills of each medicine).  He denies fever/chills, headache, musculoskeletal pain anywhere in his body, chest pain, shortness of breath, nausea, vomiting.  He says that he smokes every day but denies drugs and alcohol.  He has been under a lot of stress recently with his mother being in the ICU.  He says he does not want to be here  and does not want any further treatment or evaluation.  He reports that apparently the symptoms were acute in onset but he cannot quantify whether they were severe or not because he does not remember anything being wrong.   Past Medical History:  Diagnosis Date  . Hypertension    a. Noncompliant w/ meds.  . Seizures (HCC)    a. Dx in teens. Noncompliant w/ meds.  . Tobacco abuse     Patient Active Problem List   Diagnosis Date Noted  . New onset atrial fibrillation (HCC) 01/03/2018  . Essential hypertension 01/03/2018  . Seizure disorder (HCC) 01/03/2018    Past Surgical History:  Procedure Laterality Date  . arm    . EYE SURGERY    . FOREARM SURGERY    . KNEE SURGERY      Prior to Admission medications   Medication Sig Start Date End Date Taking? Authorizing Provider  amoxicillin (AMOXIL) 500 MG tablet Take 1 tablet (500 mg total) by mouth 3 (three) times daily. 03/06/18   Triplett, Rulon Eisenmenger B, FNP  chlorhexidine (PERIDEX) 0.12 % solution Use as directed 15 mLs in the mouth or throat 2 (two) times daily. 03/06/18   Triplett, Rulon Eisenmenger B, FNP  levETIRAcetam (KEPPRA) 500 MG tablet Take 1 tablet (500 mg total) by mouth 2 (two) times daily. 03/20/18   Loleta Rose, MD  metoprolol succinate (TOPROL-XL) 50 MG 24 hr tablet Take 1 tablet (50 mg total) by mouth daily. Take with  or immediately following a meal. 03/20/18   Loleta Rose, MD  rivaroxaban (XARELTO) 20 MG TABS tablet Take 1 tablet (20 mg total) by mouth daily. 01/03/18   Altamese Dilling, MD  traMADol (ULTRAM) 50 MG tablet Take 1 tablet (50 mg total) by mouth every 6 (six) hours as needed. 03/06/18   Triplett, Rulon Eisenmenger B, FNP    Allergies Aspirin and Ibuprofen  Family History  Problem Relation Age of Onset  . Hypertension Mother   . Diabetes Mother   . Atrial fibrillation Mother   . Heart disease Father        Pt doesn't know much about father's history but says he has either a PPM or ICD.    Social History Social History    Tobacco Use  . Smoking status: Current Every Day Smoker    Packs/day: 0.50    Years: 20.00    Pack years: 10.00  . Smokeless tobacco: Never Used  . Tobacco comment: currently smoking 0.5 - 1 ppd.  Substance Use Topics  . Alcohol use: No  . Drug use: No    Review of Systems Constitutional: No fever/chills Eyes: No visual changes. ENT: No sore throat. Cardiovascular: Denies chest pain. Respiratory: Denies shortness of breath. Gastrointestinal: No abdominal pain.  No nausea, no vomiting.  No diarrhea.  No constipation. Genitourinary: Negative for dysuria. Musculoskeletal: Negative for neck pain.  Negative for back pain. Integumentary: Negative for rash. Neurological: Report of possible seizure-like activity. Negative for headaches, focal weakness or numbness.   ____________________________________________   PHYSICAL EXAM:  VITAL SIGNS: ED Triage Vitals [03/19/18 2307]  Enc Vitals Group     BP (!) 186/113     Pulse Rate 80     Resp 18     Temp 97.9 F (36.6 C)     Temp Source Oral     SpO2 99 %     Weight 81.6 kg (180 lb)     Height 1.803 m (5\' 11" )     Head Circumference      Peak Flow      Pain Score 8     Pain Loc      Pain Edu?      Excl. in GC?     Constitutional: Alert and oriented. Well appearing and in no acute distress. Eyes: Conjunctivae are normal.  Head: Atraumatic. Nose: No congestion/rhinnorhea. Mouth/Throat: Mucous membranes are moist.  Oropharynx non-erythematous. Severe chronic dental caries especially in the right lower region, but no evidence of acute infection nor abscess. Neck: No stridor.  No meningeal signs.   Cardiovascular: Normal rate, regular rhythm. Good peripheral circulation. Grossly normal heart sounds. Respiratory: Normal respiratory effort.  No retractions. Lungs CTAB. Gastrointestinal: Soft and nontender. No distention.  Musculoskeletal: No lower extremity tenderness nor edema. No gross deformities of  extremities. Neurologic:  Normal speech and language. No gross focal neurologic deficits are appreciated.  Skin:  Skin is warm, dry and intact. No rash noted. Psychiatric: Mood and affect are normal. Speech and behavior are normal. Patient is acting appropriate with no signs of being postictal in my opinion has the capacity to make his own medical decisions.   ____________________________________________   LABS (all labs ordered are listed, but only abnormal results are displayed)  Labs Reviewed - No data to display ____________________________________________  EKG  ED ECG REPORT I, Loleta Rose, the attending physician, personally viewed and interpreted this ECG.  Date: 03/19/2018 EKG Time: 23: 10 Rate: 89 Rhythm: normal sinus rhythm QRS Axis: normal Intervals:  normal ST/T Wave abnormalities: normal Narrative Interpretation: no evidence of acute ischemia  ____________________________________________  RADIOLOGY   ED MD interpretation: No indication for imaging  Official radiology report(s): No results found.  ____________________________________________   PROCEDURES  Critical Care performed: No   Procedure(s) performed:   Procedures   ____________________________________________   INITIAL IMPRESSION / ASSESSMENT AND PLAN / ED COURSE  As part of my medical decision making, I reviewed the following data within the electronic MEDICAL RECORD NUMBER Nursing notes reviewed and incorporated, Old chart reviewed and Notes from prior ED visits    Differential diagnosis includes, but is not limited to, seizure or seizure-like activity, mood disorder or psychiatric application of all the stress in his life with his mother being in the ICU, medication noncompliance, acute infection.  The patient's blood pressure significantly elevated but this is chronic for him and he is noncompliant with his blood pressure medicine.  He reports no signs or symptoms of anything at this  time.  There is no evidence that he had a seizure-like episode but apparently he was "zoned out" for a few seconds.  He says he does not want to be here and he wants to go upstairs to see his mother.  I reviewed his medical record and see that he was put back on Keppra and encouraged to continue taking it when he was in the hospital about 3 months ago when he is not been doing so.  He reports the only thing bothering him at this time is the dental pain and I explained once again that he has to see a dentist that we cannot fix his chronic dental pain or cavities.  I offered to start him back on the Keppra including a dose tonight and a prescription as well as his metoprolol succinate and he said that he would very much appreciate that.  I have refilled those prescriptions and I also gave him a dose of Tylenol 1000 mg by mouth tonight.  He is comfortable with the plan and given that he has the capacity to make his own medical decisions and there is no indication of any acute or emergent issue tonight, I think this is appropriate.  I am also providing follow-up information for him to be established with a new primary care provider using the patient navigator as well as the dental resource guide.  I gave my usual and customary return precautions.     ____________________________________________  FINAL CLINICAL IMPRESSION(S) / ED DIAGNOSES  Final diagnoses:  Seizure-like activity (HCC)  Essential hypertension  Chronic dental pain     MEDICATIONS GIVEN DURING THIS VISIT:  Medications  metoprolol succinate (TOPROL-XL) 24 hr tablet 50 mg (50 mg Oral Given 03/19/18 2344)  acetaminophen (TYLENOL) tablet 1,000 mg (1,000 mg Oral Given 03/19/18 2343)  levETIRAcetam (KEPPRA) tablet 500 mg (500 mg Oral Given 03/19/18 2344)     ED Discharge Orders        Ordered    levETIRAcetam (KEPPRA) 500 MG tablet  2 times daily,   Status:  Discontinued     03/20/18 0008    metoprolol succinate (TOPROL-XL) 50 MG 24 hr  tablet  Daily,   Status:  Discontinued     03/20/18 0008    levETIRAcetam (KEPPRA) 500 MG tablet  2 times daily     03/20/18 0009    metoprolol succinate (TOPROL-XL) 50 MG 24 hr tablet  Daily     03/20/18 0009    levETIRAcetam (KEPPRA) 500 MG tablet  2 times  daily,   Status:  Discontinued     03/19/18 2337    metoprolol succinate (TOPROL-XL) 50 MG 24 hr tablet  Daily,   Status:  Discontinued     03/19/18 2337       Note:  This document was prepared using Dragon voice recognition software and may include unintentional dictation errors.    Loleta RoseForbach, Bren Borys, MD 03/20/18 Lavonna [id number]    Loleta RoseForbach, Phelicia Dantes, MD 03/20/18 0010

## 2018-03-20 MED ORDER — LEVETIRACETAM 500 MG PO TABS: 500.0000 mg | ORAL_TABLET | Freq: Two times a day (BID) | ORAL | 11 refills | Status: DC

## 2018-03-20 MED ORDER — METOPROLOL SUCCINATE ER 50 MG PO TB24: 50.0000 mg | ORAL_TABLET | Freq: Every day | ORAL | 11 refills | Status: DC

## 2018-03-20 NOTE — Discharge Instructions (Addendum)
As we discussed, you need to be taking her blood pressure medicine and your seizure medicine and we provided prescriptions for both.  We also provided you with a discount card that may help get to the best possible price for your prescriptions.  You declined any further testing or evaluation at this time, and given that there is no sign you have any emergent medical condition at this time, we feel that is appropriate and you have the right to make your own decisions.  If you develop any new or worsening symptoms, please return to the emergency department.  We strongly encourage you to establish a primary care provider and to follow-up with 1 of the dental options listed in the included resource guide.

## 2018-03-28 ENCOUNTER — Telehealth: Payer: Self-pay | Admitting: Cardiovascular Disease

## 2018-03-28 NOTE — Telephone Encounter (Signed)
Recieved request from : [facility] [employer]  Forwarded to [employer] for processing

## 2018-04-26 ENCOUNTER — Other Ambulatory Visit: Payer: Self-pay

## 2018-04-26 ENCOUNTER — Emergency Department
Admission: EM | Admit: 2018-04-26 | Discharge: 2018-04-26 | Disposition: A | Discharge status: Home / Self Care | Payer: Medicaid Other | Attending: Emergency Medicine | Admitting: Emergency Medicine

## 2018-04-26 ENCOUNTER — Telehealth: Payer: Self-pay | Admitting: Emergency Medicine

## 2018-04-26 DIAGNOSIS — R079 Chest pain, unspecified: Secondary | ICD-10-CM | POA: Diagnosis present

## 2018-04-26 DIAGNOSIS — Z5321 Procedure and treatment not carried out due to patient leaving prior to being seen by health care provider: Secondary | ICD-10-CM | POA: Diagnosis not present

## 2018-04-26 LAB — COMPREHENSIVE METABOLIC PANEL
ALBUMIN: 4.3 g/dL (ref 3.5–5.0)
ALT: 25 U/L (ref 0–44)
AST: 22 U/L (ref 15–41)
Alkaline Phosphatase: 70 U/L (ref 38–126)
Anion gap: 9 (ref 5–15)
BUN: 14 mg/dL (ref 6–20)
CHLORIDE: 103 mmol/L (ref 98–111)
CO2: 28 mmol/L (ref 22–32)
CREATININE: 1 mg/dL (ref 0.61–1.24)
Calcium: 9.1 mg/dL (ref 8.9–10.3)
GFR calc Af Amer: >60 mL/min (ref >60)
GFR calc non Af Amer: >60 mL/min (ref >60)
Glucose, Bld: 92 mg/dL (ref 70–99)
POTASSIUM: 3.7 mmol/L (ref 3.5–5.1)
SODIUM: 140 mmol/L (ref 135–145)
Total Bilirubin: 0.5 mg/dL (ref 0.3–1.2)
Total Protein: 8 g/dL (ref 6.5–8.1)

## 2018-04-26 LAB — CBC
HCT: 47 % (ref 40.0–52.0)
Hemoglobin: 16 g/dL (ref 13.0–18.0)
MCH: 31 pg (ref 26.0–34.0)
MCHC: 34.1 g/dL (ref 32.0–36.0)
MCV: 90.8 fL (ref 80.0–100.0)
PLATELETS: 219 10*3/uL (ref 150–440)
RBC: 5.17 MIL/uL (ref 4.40–5.90)
RDW: 13.7 % (ref 11.5–14.5)
WBC: 11 10*3/uL — AB (ref 3.8–10.6)

## 2018-04-26 LAB — TROPONIN I: Troponin I: <0.03 ng/mL (ref <0.03)

## 2018-04-26 NOTE — ED Notes (Signed)
Pt noted leaving ED lobby 

## 2018-04-26 NOTE — ED Notes (Signed)
Pt not found in lobby or outside  

## 2018-04-26 NOTE — ED Notes (Signed)
Pt has returned to ED lobby 

## 2018-04-26 NOTE — ED Notes (Signed)
Pt not found in lobby 

## 2018-04-26 NOTE — ED Triage Notes (Signed)
Pt in with co chest pain since yest, wife also states she believes he may have had a seizure tonight. Pt does havea hx of the same states did take his meds.

## 2018-04-26 NOTE — Telephone Encounter (Signed)
Called patient due to lwot to inquire about condition and follow up plans. Call will not complete.

## 2018-05-14 ENCOUNTER — Other Ambulatory Visit: Payer: Self-pay

## 2018-05-14 ENCOUNTER — Emergency Department: Payer: Medicaid Other

## 2018-05-14 DIAGNOSIS — Z7901 Long term (current) use of anticoagulants: Secondary | ICD-10-CM | POA: Diagnosis not present

## 2018-05-14 DIAGNOSIS — I4891 Unspecified atrial fibrillation: Secondary | ICD-10-CM | POA: Insufficient documentation

## 2018-05-14 DIAGNOSIS — Y939 Activity, unspecified: Secondary | ICD-10-CM | POA: Diagnosis not present

## 2018-05-14 DIAGNOSIS — F17210 Nicotine dependence, cigarettes, uncomplicated: Secondary | ICD-10-CM | POA: Diagnosis not present

## 2018-05-14 DIAGNOSIS — Y999 Unspecified external cause status: Secondary | ICD-10-CM | POA: Diagnosis not present

## 2018-05-14 DIAGNOSIS — W06XXXA Fall from bed, initial encounter: Secondary | ICD-10-CM | POA: Insufficient documentation

## 2018-05-14 DIAGNOSIS — Y92003 Bedroom of unspecified non-institutional (private) residence as the place of occurrence of the external cause: Secondary | ICD-10-CM | POA: Insufficient documentation

## 2018-05-14 DIAGNOSIS — S63501A Unspecified sprain of right wrist, initial encounter: Secondary | ICD-10-CM | POA: Insufficient documentation

## 2018-05-14 DIAGNOSIS — I10 Essential (primary) hypertension: Secondary | ICD-10-CM | POA: Insufficient documentation

## 2018-05-14 DIAGNOSIS — Z79899 Other long term (current) drug therapy: Secondary | ICD-10-CM | POA: Insufficient documentation

## 2018-05-14 DIAGNOSIS — S6991XA Unspecified injury of right wrist, hand and finger(s), initial encounter: Secondary | ICD-10-CM | POA: Diagnosis present

## 2018-05-14 NOTE — ED Triage Notes (Signed)
Pt was on bed trying to get a bug and fell backwards. Is now co pain and tingling to right wrist area.

## 2018-05-15 ENCOUNTER — Emergency Department
Admission: EM | Admit: 2018-05-15 | Discharge: 2018-05-15 | Disposition: A | Discharge status: Home / Self Care | Payer: Medicaid Other | Attending: Emergency Medicine | Admitting: Emergency Medicine

## 2018-05-15 ENCOUNTER — Emergency Department: Payer: Medicaid Other

## 2018-05-15 DIAGNOSIS — S63501A Unspecified sprain of right wrist, initial encounter: Secondary | ICD-10-CM

## 2018-05-15 DIAGNOSIS — T148XXA Other injury of unspecified body region, initial encounter: Secondary | ICD-10-CM

## 2018-05-15 MED ORDER — OXYCODONE-ACETAMINOPHEN 5-325 MG PO TABS: 1.0000 | ORAL_TABLET | ORAL | 0 refills | Status: DC | PRN

## 2018-05-15 MED ORDER — OXYCODONE-ACETAMINOPHEN 5-325 MG PO TABS
1.0000 | ORAL_TABLET | Freq: Once | ORAL | Status: AC
  Administered 2018-05-15: 1 via ORAL
  Filled 2018-05-15: qty 1

## 2018-05-15 NOTE — ED Notes (Signed)
Pt and family asleep in room.  

## 2018-05-15 NOTE — Discharge Instructions (Signed)
1.  You may take Percocet as needed for pain. 2.  You may remove Velcro splint to bathe and sleep. 3.  Elevate affected area and apply ice several times daily. 4.  Return to the ER for worsening symptoms, persistent vomiting, difficulty breathing or other concerns.

## 2018-05-15 NOTE — ED Provider Notes (Signed)
[facility] Emergency Department Provider Note   ____________________________________________   First MD Initiated Contact with Patient 05/15/18 [phone]     (approximate)  I have reviewed the triage vital signs and the nursing notes.   HISTORY  Chief Complaint Fall    HPI Jeremy Yoder is a 37 y.o. male who presents to the ED from home with a chief complaint of fall with right wrist pain.  Patient states he was laying on the bed trying to swat a bug and fell backwards onto his outstretched right hand.  Complains of wrist pain.  Denies extremity weakness or numbness.  Initially has some tingling to his right wrist but none currently.  Denies striking head or LOC.  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   Past Medical History:  Diagnosis Date  . Hypertension    a. Noncompliant w/ meds.  . Seizures (HCC)    a. Dx in teens. Noncompliant w/ meds.  . Tobacco abuse     Patient Active Problem List   Diagnosis Date Noted  . New onset atrial fibrillation (HCC) 01/03/2018  . Essential hypertension 01/03/2018  . Seizure disorder (HCC) 01/03/2018    Past Surgical History:  Procedure Laterality Date  . arm    . EYE SURGERY    . FOREARM SURGERY    . KNEE SURGERY      Prior to Admission medications   Medication Sig Start Date End Date Taking? Authorizing Provider  amoxicillin (AMOXIL) 500 MG tablet Take 1 tablet (500 mg total) by mouth 3 (three) times daily. 03/06/18   Triplett, Rulon Eisenmenger B, FNP  chlorhexidine (PERIDEX) 0.12 % solution Use as directed 15 mLs in the mouth or throat 2 (two) times daily. 03/06/18   Triplett, Rulon Eisenmenger B, FNP  levETIRAcetam (KEPPRA) 500 MG tablet Take 1 tablet (500 mg total) by mouth 2 (two) times daily. 03/20/18   Loleta Rose, MD  metoprolol succinate (TOPROL-XL) 50 MG 24 hr tablet Take 1 tablet (50 mg total) by mouth daily. Take with or immediately following a meal. 03/20/18    Loleta Rose, MD  oxyCODONE-acetaminophen (PERCOCET/ROXICET) 5-325 MG tablet Take 1 tablet by mouth every 4 (four) hours as needed for severe pain. 05/15/18   Irean Hong, MD  rivaroxaban (XARELTO) 20 MG TABS tablet Take 1 tablet (20 mg total) by mouth daily. 01/03/18   Altamese Dilling, MD  traMADol (ULTRAM) 50 MG tablet Take 1 tablet (50 mg total) by mouth every 6 (six) hours as needed. 03/06/18   Triplett, Rulon Eisenmenger B, FNP    Allergies Aspirin and Ibuprofen  Family History  Problem Relation Age of Onset  . Hypertension Mother   . Diabetes Mother   . Atrial fibrillation Mother   . Heart disease Father        Pt doesn't know much about father's history but says he has either a PPM or ICD.    Social History Social History   Tobacco Use  . Smoking status: Current Every Day Smoker    Packs/day: 0.50    Years: 20.00    Pack years: 10.00  . Smokeless tobacco: Never Used  . Tobacco comment: currently smoking 0.5 - 1 ppd.  Substance Use Topics  . Alcohol use: No  . Drug use: No    Review of Systems  Constitutional: No fever/chills Eyes: No visual changes. ENT: No sore throat. Cardiovascular: Denies chest pain. Respiratory: Denies shortness of breath. Gastrointestinal: No  abdominal pain.  No nausea, no vomiting.  No diarrhea.  No constipation. Genitourinary: Negative for dysuria. Musculoskeletal: Positive for right wrist pain.  Negative for back pain. Skin: Negative for rash. Neurological: Negative for headaches, focal weakness or numbness.   ____________________________________________   PHYSICAL EXAM:  VITAL SIGNS: ED Triage Vitals  Enc Vitals Group     BP 05/14/18 2313 (!) 168/110     Pulse Rate 05/14/18 2313 80     Resp 05/14/18 2313 18     Temp 05/14/18 2313 98.2 F (36.8 C)     Temp Source 05/14/18 2313 Oral     SpO2 05/14/18 2313 97 %     Weight 05/14/18 2316 180 lb (81.6 kg)     Height 05/14/18 2316 6' (1.829 m)     Head Circumference --      Peak  Flow --      Pain Score 05/14/18 2316 10     Pain Loc --      Pain Edu? --      Excl. in GC? --     Constitutional: Asleep, alert and oriented. Well appearing and in no acute distress. Eyes: Conjunctivae are normal. PERRL. EOMI. Head: Atraumatic. Nose: Atraumatic. Mouth/Throat: Mucous membranes are moist.  No dental malocclusion. Neck: No stridor.  No cervical spine tenderness to palpation. Cardiovascular: Normal rate, regular rhythm. Grossly normal heart sounds.  Good peripheral circulation. Respiratory: Normal respiratory effort.  No retractions. Lungs CTAB. Gastrointestinal: Soft and nontender. No distention. No abdominal bruits. No CVA tenderness. Musculoskeletal:  RUE: Tender to palpation at ulnar styloid and fifth metacarpal.  Slight abrasion to medial wrist without bleeding.  Pre-existing phalangeal contractures.  Limited range of motion secondary to pain.  2+ radial pulse.  Brisk, less than 5-second capillary refill. Neurologic:  Normal speech and language. No gross focal neurologic deficits are appreciated. No gait instability. Skin:  Skin is warm, dry and intact. No rash noted. Psychiatric: Mood and affect are normal. Speech and behavior are normal.  ____________________________________________   LABS (all labs ordered are listed, but only abnormal results are displayed)  Labs Reviewed - No data to display ____________________________________________  EKG  None ____________________________________________  RADIOLOGY  ED MD interpretation: No acute fractures or dislocations of right wrist or hand  Official radiology report(s): Dg Wrist Complete Right  Result Date: 05/14/2018 CLINICAL DATA:  Larey Seat out of bed EXAM: RIGHT WRIST - COMPLETE 3+ VIEW COMPARISON:  12/22/2004 FINDINGS: No acute displaced fracture or malalignment. No radiopaque foreign body in the soft tissues. IMPRESSION: No acute osseous abnormality. Electronically Signed   By: Jasmine Pang M.D.   On:  05/14/2018 23:46   Dg Hand Complete Right  Result Date: 05/15/2018 CLINICAL DATA:  Right hand pain after fall. Patient reports contracture from prior injury. EXAM: RIGHT HAND - COMPLETE 3+ VIEW COMPARISON:  Wrist radiograph yesterday FINDINGS: The digits are held in flexion at the proximal interphalangeal joint on all views. No acute fracture or dislocation. No bony destructive change. No focal soft tissue abnormality. IMPRESSION: No fracture or acute osseous abnormality of the hand. Electronically Signed   By: Narda Rutherford M.D.   On: 05/15/2018 03:32    ____________________________________________   PROCEDURES  Procedure(s) performed: None  Procedures  Critical Care performed: No  ____________________________________________   INITIAL IMPRESSION / ASSESSMENT AND PLAN / ED COURSE  As part of my medical decision making, I reviewed the following data within the electronic MEDICAL RECORD NUMBER Nursing notes reviewed and incorporated, Radiograph reviewed and  Notes from prior ED visits   37 year old male who presents with right wrist sprain.  Will administer Percocet, place in Velcro wrist splint and refer to orthopedics for outpatient follow-up as needed.  Strict return precautions given.  Patient verbalizes understanding and agrees with plan of care.      ____________________________________________   FINAL CLINICAL IMPRESSION(S) / ED DIAGNOSES  Final diagnoses:  Sprain of right wrist, initial encounter  Abrasion     ED Discharge Orders         Ordered    oxyCODONE-acetaminophen (PERCOCET/ROXICET) 5-325 MG tablet  Every 4 hours PRN     05/15/18 0342           Note:  This document was prepared using Dragon voice recognition software and may include unintentional dictation errors.    Irean Hong, MD 05/15/18 [phone]

## 2018-06-01 ENCOUNTER — Emergency Department
Admission: EM | Admit: 2018-06-01 | Discharge: 2018-06-02 | Disposition: A | Discharge status: Home / Self Care | Payer: Medicaid Other | Attending: Emergency Medicine | Admitting: Emergency Medicine

## 2018-06-01 ENCOUNTER — Other Ambulatory Visit: Payer: Self-pay

## 2018-06-01 ENCOUNTER — Encounter: Payer: Self-pay | Admitting: Emergency Medicine

## 2018-06-01 DIAGNOSIS — Z79899 Other long term (current) drug therapy: Secondary | ICD-10-CM | POA: Diagnosis not present

## 2018-06-01 DIAGNOSIS — A691 Other Vincent's infections: Secondary | ICD-10-CM | POA: Insufficient documentation

## 2018-06-01 DIAGNOSIS — K0889 Other specified disorders of teeth and supporting structures: Secondary | ICD-10-CM | POA: Diagnosis present

## 2018-06-01 DIAGNOSIS — I10 Essential (primary) hypertension: Secondary | ICD-10-CM | POA: Diagnosis not present

## 2018-06-01 DIAGNOSIS — Z7901 Long term (current) use of anticoagulants: Secondary | ICD-10-CM | POA: Diagnosis not present

## 2018-06-01 DIAGNOSIS — F17210 Nicotine dependence, cigarettes, uncomplicated: Secondary | ICD-10-CM | POA: Insufficient documentation

## 2018-06-01 MED ORDER — METRONIDAZOLE 500 MG PO TABS: 500.0000 mg | ORAL_TABLET | Freq: Three times a day (TID) | ORAL | 0 refills | Status: AC

## 2018-06-01 MED ORDER — BUPIVACAINE HCL (PF) 0.5 % IJ SOLN
30.0000 mL | Freq: Once | INTRAMUSCULAR | Status: DC
  Filled 2018-06-01: qty 30

## 2018-06-01 MED ORDER — ACETAMINOPHEN 500 MG PO TABS
1000.0000 mg | ORAL_TABLET | Freq: Once | ORAL | Status: AC
  Administered 2018-06-02: 1000 mg via ORAL
  Filled 2018-06-01: qty 2

## 2018-06-01 MED ORDER — AMOXICILLIN-POT CLAVULANATE 875-125 MG PO TABS: 1.0000 | ORAL_TABLET | Freq: Two times a day (BID) | ORAL | 0 refills | Status: AC

## 2018-06-01 NOTE — ED Triage Notes (Signed)
Pt ambulatory to triage with no difficulty. Pt reports dntal pain from broken and bad teeth. Pt reports also has had a cough. Talking in full and complete sentences.

## 2018-06-01 NOTE — ED Notes (Signed)
Pt to the er for pain in his mouth and difficulty swallowing. Pt has a broken tooth to the upper right front that happened on the 17th. Pt said pain began a week ago.

## 2018-06-01 NOTE — ED Provider Notes (Signed)
[facility] Emergency Department Provider Note  ____________________________________________   First MD Initiated Contact with Patient 06/01/18 2339     (approximate)  I have reviewed the triage vital signs and the nursing notes.   HISTORY  Chief Complaint Dental Pain   HPI Jeremy Yoder is a 37 y.o. male self presents to the emergency department with chronic dental pain.  Is particularly worse in his right upper tooth and left lower tooth.  He has a cracked tooth on the right upper.  He says he has not seen a dentist since he was a child because he cannot afford it.  Is become concerned because recently has had increasingly bad breath in his gums bleed when he brushes them.  He has no difficulty swallowing.  No fevers or chills.  No history of diabetes.  Symptoms have been gradual onset slowly progressive are now moderate severity.    Past Medical History:  Diagnosis Date  . Hypertension    a. Noncompliant w/ meds.  . Seizures (HCC)    a. Dx in teens. Noncompliant w/ meds.  . Tobacco abuse     Patient Active Problem List   Diagnosis Date Noted  . New onset atrial fibrillation (HCC) 01/03/2018  . Essential hypertension 01/03/2018  . Seizure disorder (HCC) 01/03/2018    Past Surgical History:  Procedure Laterality Date  . arm    . EYE SURGERY    . FOREARM SURGERY    . KNEE SURGERY      Prior to Admission medications   Medication Sig Start Date End Date Taking? Authorizing Provider  amoxicillin (AMOXIL) 500 MG tablet Take 1 tablet (500 mg total) by mouth 3 (three) times daily. 03/06/18   Triplett, Rulon Eisenmenger B, FNP  amoxicillin-clavulanate (AUGMENTIN) 875-125 MG tablet Take 1 tablet by mouth 2 (two) times daily for 7 days. 06/01/18 06/08/18  Merrily Brittle, MD  chlorhexidine (PERIDEX) 0.12 % solution Use as directed 15 mLs in the mouth or throat 2 (two) times daily. 03/06/18   Triplett, Rulon Eisenmenger B, FNP  levETIRAcetam (KEPPRA) 500 MG tablet Take 1 tablet  (500 mg total) by mouth 2 (two) times daily. 03/20/18   Loleta Rose, MD  metoprolol succinate (TOPROL-XL) 50 MG 24 hr tablet Take 1 tablet (50 mg total) by mouth daily. Take with or immediately following a meal. 03/20/18   Loleta Rose, MD  metroNIDAZOLE (FLAGYL) 500 MG tablet Take 1 tablet (500 mg total) by mouth 3 (three) times daily for 7 days. 06/01/18 06/08/18  Merrily Brittle, MD  oxyCODONE-acetaminophen (PERCOCET/ROXICET) 5-325 MG tablet Take 1 tablet by mouth every 4 (four) hours as needed for severe pain. 05/15/18   Irean Hong, MD  rivaroxaban (XARELTO) 20 MG TABS tablet Take 1 tablet (20 mg total) by mouth daily. 01/03/18   Altamese Dilling, MD  traMADol (ULTRAM) 50 MG tablet Take 1 tablet (50 mg total) by mouth every 6 (six) hours as needed. 03/06/18   Triplett, Rulon Eisenmenger B, FNP    Allergies Aspirin and Ibuprofen  Family History  Problem Relation Age of Onset  . Hypertension Mother   . Diabetes Mother   . Atrial fibrillation Mother   . Heart disease Father        Pt doesn't know much about father's history but says he has either a PPM or ICD.    Social History Social History   Tobacco Use  . Smoking status: Current Every Day Smoker    Packs/day: 0.50    Years: 20.00  Pack years: 10.00  . Smokeless tobacco: Never Used  . Tobacco comment: currently smoking 0.5 - 1 ppd.  Substance Use Topics  . Alcohol use: No  . Drug use: No    Review of Systems Constitutional: No fever/chills ENT: As of for dental pain Cardiovascular: Denies chest pain. Respiratory: Denies shortness of breath. Gastrointestinal: No abdominal pain.  No nausea, no vomiting.   Musculoskeletal: Negative for back pain. Neurological: Negative for headaches   ____________________________________________   PHYSICAL EXAM:  VITAL SIGNS: ED Triage Vitals  Enc Vitals Group     BP 06/01/18 2237 (!) 166/98     Pulse Rate 06/01/18 2237 98     Resp 06/01/18 2237 18     Temp 06/01/18 2237 98.3 F  (36.8 C)     Temp Source 06/01/18 2237 Oral     SpO2 06/01/18 2237 97 %     Weight 06/01/18 2238 180 lb (81.6 kg)     Height 06/01/18 2238 6' (1.829 m)     Head Circumference --      Peak Flow --      Pain Score 06/01/18 2238 8     Pain Loc --      Pain Edu? --      Excl. in GC? --     Constitutional: Alert and oriented x4 pleasant cooperative speaks full clear sentences no diaphoresis Head: Atraumatic. Nose: No congestion/rhinnorhea. Mouth/Throat: No trismus uvula midline no pharyngeal erythema or exudate.  Very poor dentition with multiple cracked teeth.  On gentle percussion his gums are bleeding easily.  No obvious abscess noted.  No sub-lingual or submental induration or fullness.  No signs of posterior involvement. Neck: No stridor.   Respiratory: Normal respiratory effort.  No retractions. Neurologic:  Normal speech and language. No gross focal neurologic deficits are appreciated.  Skin:  Skin is warm, dry and intact. No rash noted.    ____________________________________________  LABS (all labs ordered are listed, but only abnormal results are displayed)  Labs Reviewed - No data to display   __________________________________________  EKG   ____________________________________________  RADIOLOGY   ____________________________________________   DIFFERENTIAL includes but not limited to  Dental abscess, dental pain, gingivitis, Ludwig angina   PROCEDURES  Procedure(s) performed: no  Procedures  Critical Care performed: no  ____________________________________________   INITIAL IMPRESSION / ASSESSMENT AND PLAN / ED COURSE  Pertinent labs & imaging results that were available during my care of the patient were reviewed by me and considered in my medical decision making (see chart for details).   As part of my medical decision making, I reviewed the following data within the electronic MEDICAL RECORD NUMBER History obtained from family if available,  nursing notes, old chart and ekg, as well as notes from prior ED visits.  The patient is well-appearing with chronic dental pain that seems to have progressed to ANUG.  I offered him a dental block however he declined stating he was afraid of needles.  We discussed the importance of following up with the dentist as we do not have a dentist in the hospital.  I will cover him with both Augmentin and Flagyl and as he is allergic to nonsteroidals treat him with Tylenol.  I provided him with dental resources.  He is discharged home in stable condition.      ____________________________________________   FINAL CLINICAL IMPRESSION(S) / ED DIAGNOSES  Final diagnoses:  ANUG (acute necrotizing ulcerative gingivitis)      NEW MEDICATIONS STARTED DURING THIS VISIT:  Discharge Medication List as of 06/01/2018 11:59 PM    START taking these medications   Details  amoxicillin-clavulanate (AUGMENTIN) 875-125 MG tablet Take 1 tablet by mouth 2 (two) times daily for 7 days., Starting Sat 06/01/2018, Until Sat 06/08/2018, Print    metroNIDAZOLE (FLAGYL) 500 MG tablet Take 1 tablet (500 mg total) by mouth 3 (three) times daily for 7 days., Starting Sat 06/01/2018, Until Sat 06/08/2018, Print         Note:  This document was prepared using Dragon voice recognition software and may include unintentional dictation errors.      Merrily Brittle, MD 06/02/18 [phone]

## 2018-06-01 NOTE — Discharge Instructions (Signed)
Please take both of your antibiotics as prescribed and make an appointment to follow-up with a dentist this coming week for recheck.  Return to the emergency department sooner for any concerns.  It was a pleasure to take care of you today, and thank you for coming to our emergency department.  If you have any questions or concerns before leaving please ask the nurse to grab me and I'm more than happy to go through your aftercare instructions again.  If you have any concerns once you are home that you are not improving or are in fact getting worse before you can make it to your follow-up appointment, please do not hesitate to call 911 and come back for further evaluation.  Merrily Brittle, MD

## 2018-06-02 NOTE — ED Notes (Signed)
Pt refused to be numbed by MD

## 2018-07-17 ENCOUNTER — Emergency Department
Admission: EM | Admit: 2018-07-17 | Discharge: 2018-07-17 | Disposition: A | Discharge status: Home / Self Care | Payer: No Typology Code available for payment source | Attending: Student in an Organized Health Care Education/Training Program | Admitting: Student in an Organized Health Care Education/Training Program

## 2018-07-17 ENCOUNTER — Other Ambulatory Visit: Payer: Self-pay

## 2018-07-17 ENCOUNTER — Emergency Department: Payer: No Typology Code available for payment source

## 2018-07-17 DIAGNOSIS — Y92410 Unspecified street and highway as the place of occurrence of the external cause: Secondary | ICD-10-CM | POA: Diagnosis not present

## 2018-07-17 DIAGNOSIS — Y9389 Activity, other specified: Secondary | ICD-10-CM | POA: Insufficient documentation

## 2018-07-17 DIAGNOSIS — I10 Essential (primary) hypertension: Secondary | ICD-10-CM | POA: Insufficient documentation

## 2018-07-17 DIAGNOSIS — F17210 Nicotine dependence, cigarettes, uncomplicated: Secondary | ICD-10-CM | POA: Diagnosis not present

## 2018-07-17 DIAGNOSIS — Z79899 Other long term (current) drug therapy: Secondary | ICD-10-CM | POA: Insufficient documentation

## 2018-07-17 DIAGNOSIS — M545 Low back pain, unspecified: Secondary | ICD-10-CM

## 2018-07-17 DIAGNOSIS — Y998 Other external cause status: Secondary | ICD-10-CM | POA: Diagnosis not present

## 2018-07-17 HISTORY — DX: Unspecified atrial fibrillation: I48.91

## 2018-07-17 MED ORDER — ACETAMINOPHEN 500 MG PO TABS: 500.0000 mg | ORAL_TABLET | Freq: Four times a day (QID) | ORAL | 0 refills | Status: DC | PRN

## 2018-07-17 MED ORDER — CYCLOBENZAPRINE HCL 5 MG PO TABS: ORAL_TABLET | ORAL | 0 refills | Status: DC

## 2018-07-17 MED ORDER — ACETAMINOPHEN 500 MG PO TABS
1000.0000 mg | ORAL_TABLET | Freq: Once | ORAL | Status: AC
  Administered 2018-07-17: 1000 mg via ORAL
  Filled 2018-07-17: qty 2

## 2018-07-17 MED ORDER — LIDOCAINE 5 % EX PTCH
1.0000 | MEDICATED_PATCH | CUTANEOUS | Status: DC
Start: 2018-07-17 — End: 2018-07-17
  Administered 2018-07-17: 1 via TRANSDERMAL
  Filled 2018-07-17: qty 1
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
## 2018-08-14 ENCOUNTER — Encounter: Payer: Self-pay | Admitting: Emergency Medicine

## 2018-08-14 ENCOUNTER — Other Ambulatory Visit: Payer: Self-pay

## 2018-08-14 DIAGNOSIS — R109 Unspecified abdominal pain: Secondary | ICD-10-CM | POA: Insufficient documentation

## 2018-08-14 DIAGNOSIS — Z5321 Procedure and treatment not carried out due to patient leaving prior to being seen by health care provider: Secondary | ICD-10-CM | POA: Diagnosis not present

## 2018-08-14 NOTE — ED Triage Notes (Signed)
Patient ambulatory to triage with steady gait, without difficulty or distress noted; pt reports generalized abd pain accomp by N/V

## 2018-08-15 ENCOUNTER — Emergency Department
Admission: EM | Admit: 2018-08-15 | Discharge: 2018-08-15 | Disposition: A | Discharge status: Home / Self Care | Payer: Medicaid Other | Attending: Emergency Medicine | Admitting: Emergency Medicine

## 2018-08-15 LAB — CBC WITH DIFFERENTIAL/PLATELET
Abs Immature Granulocytes: 0.02 10*3/uL (ref 0.00–0.07)
Basophils Absolute: 0.1 10*3/uL (ref 0.0–0.1)
Basophils Relative: 1 %
Eosinophils Absolute: 0.5 10*3/uL (ref 0.0–0.5)
Eosinophils Relative: 5 %
HCT: 48.3 % (ref 39.0–52.0)
Hemoglobin: 16 g/dL (ref 13.0–17.0)
IMMATURE GRANULOCYTES: 0 %
Lymphocytes Relative: 47 %
Lymphs Abs: 4.3 10*3/uL — ABNORMAL HIGH (ref 0.7–4.0)
MCH: 30 pg (ref 26.0–34.0)
MCHC: 33.1 g/dL (ref 30.0–36.0)
MCV: 90.4 fL (ref 80.0–100.0)
Monocytes Absolute: 1.1 10*3/uL — ABNORMAL HIGH (ref 0.1–1.0)
Monocytes Relative: 12 %
Neutro Abs: 3.3 10*3/uL (ref 1.7–7.7)
Neutrophils Relative %: 35 %
Platelets: 204 10*3/uL (ref 150–400)
RBC: 5.34 MIL/uL (ref 4.22–5.81)
RDW: 13.5 % (ref 11.5–15.5)
WBC: 9.4 10*3/uL (ref 4.0–10.5)
nRBC: 0 % (ref 0.0–0.2)

## 2018-08-15 LAB — URINALYSIS, COMPLETE (UACMP) WITH MICROSCOPIC
Bacteria, UA: NONE SEEN
Bilirubin Urine: NEGATIVE
GLUCOSE, UA: NEGATIVE mg/dL
Hgb urine dipstick: NEGATIVE
Ketones, ur: NEGATIVE mg/dL
LEUKOCYTES UA: NEGATIVE
Nitrite: NEGATIVE
PROTEIN: NEGATIVE mg/dL
Specific Gravity, Urine: 1.025 (ref 1.005–1.030)
pH: 5 (ref 5.0–8.0)

## 2018-08-15 LAB — COMPREHENSIVE METABOLIC PANEL
ALK PHOS: 71 U/L (ref 38–126)
ALT: 25 U/L (ref 0–44)
ANION GAP: 4 — AB (ref 5–15)
AST: 22 U/L (ref 15–41)
Albumin: 4.3 g/dL (ref 3.5–5.0)
BUN: 11 mg/dL (ref 6–20)
CO2: 28 mmol/L (ref 22–32)
Calcium: 8.9 mg/dL (ref 8.9–10.3)
Chloride: 107 mmol/L (ref 98–111)
Creatinine, Ser: 0.78 mg/dL (ref 0.61–1.24)
GFR calc Af Amer: >60 mL/min (ref >60)
GFR calc non Af Amer: >60 mL/min (ref >60)
Glucose, Bld: 97 mg/dL (ref 70–99)
POTASSIUM: 4.2 mmol/L (ref 3.5–5.1)
Sodium: 139 mmol/L (ref 135–145)
Total Bilirubin: 0.4 mg/dL (ref 0.3–1.2)
Total Protein: 7.3 g/dL (ref 6.5–8.1)

## 2018-08-15 LAB — LIPASE, BLOOD: Lipase: 38 U/L (ref 11–51)
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
## 2018-08-15 NOTE — ED Notes (Signed)
No answer when called from lobby 

## 2018-08-15 NOTE — ED Notes (Signed)
Pt called from lobby 4x with no response, this tech and registration does not see pt in lobby

## 2018-08-16 ENCOUNTER — Other Ambulatory Visit: Payer: Self-pay

## 2018-08-16 DIAGNOSIS — R1033 Periumbilical pain: Secondary | ICD-10-CM | POA: Diagnosis present

## 2018-08-16 DIAGNOSIS — I10 Essential (primary) hypertension: Secondary | ICD-10-CM | POA: Insufficient documentation

## 2018-08-16 DIAGNOSIS — R1030 Lower abdominal pain, unspecified: Secondary | ICD-10-CM | POA: Diagnosis not present

## 2018-08-16 DIAGNOSIS — F17200 Nicotine dependence, unspecified, uncomplicated: Secondary | ICD-10-CM | POA: Insufficient documentation

## 2018-08-16 DIAGNOSIS — Z79899 Other long term (current) drug therapy: Secondary | ICD-10-CM | POA: Diagnosis not present

## 2018-08-16 DIAGNOSIS — Z7901 Long term (current) use of anticoagulants: Secondary | ICD-10-CM | POA: Insufficient documentation

## 2018-08-16 LAB — URINALYSIS, COMPLETE (UACMP) WITH MICROSCOPIC
Bilirubin Urine: NEGATIVE
Glucose, UA: NEGATIVE mg/dL
Hgb urine dipstick: NEGATIVE
Ketones, ur: NEGATIVE mg/dL
Leukocytes, UA: NEGATIVE
Nitrite: NEGATIVE
Protein, ur: NEGATIVE mg/dL
Specific Gravity, Urine: 1.023 (ref 1.005–1.030)
Squamous Epithelial / HPF: NONE SEEN (ref 0–5)
WBC, UA: NONE SEEN WBC/hpf (ref 0–5)
pH: 6 (ref 5.0–8.0)

## 2018-08-16 LAB — CBC
HCT: 48.7 % (ref 39.0–52.0)
Hemoglobin: 16.3 g/dL (ref 13.0–17.0)
MCH: 29.8 pg (ref 26.0–34.0)
MCHC: 33.5 g/dL (ref 30.0–36.0)
MCV: 89 fL (ref 80.0–100.0)
Platelets: 227 10*3/uL (ref 150–400)
RBC: 5.47 MIL/uL (ref 4.22–5.81)
RDW: 13.2 % (ref 11.5–15.5)
WBC: 10.4 10*3/uL (ref 4.0–10.5)
nRBC: 0 % (ref 0.0–0.2)

## 2018-08-16 LAB — LIPASE, BLOOD: Lipase: 37 U/L (ref 11–51)

## 2018-08-16 LAB — COMPREHENSIVE METABOLIC PANEL
ALT: 21 U/L (ref 0–44)
AST: 23 U/L (ref 15–41)
Albumin: 4 g/dL (ref 3.5–5.0)
Alkaline Phosphatase: 70 U/L (ref 38–126)
Anion gap: 7 (ref 5–15)
BUN: 12 mg/dL (ref 6–20)
CO2: 24 mmol/L (ref 22–32)
CREATININE: 0.92 mg/dL (ref 0.61–1.24)
Calcium: 8.7 mg/dL — ABNORMAL LOW (ref 8.9–10.3)
Chloride: 107 mmol/L (ref 98–111)
GFR calc Af Amer: >60 mL/min (ref >60)
GFR calc non Af Amer: >60 mL/min (ref >60)
Glucose, Bld: 120 mg/dL — ABNORMAL HIGH (ref 70–99)
Potassium: 3.8 mmol/L (ref 3.5–5.1)
Sodium: 138 mmol/L (ref 135–145)
Total Bilirubin: 0.7 mg/dL (ref 0.3–1.2)
Total Protein: 7.4 g/dL (ref 6.5–8.1)

## 2018-08-16 NOTE — ED Triage Notes (Signed)
Pt states upper abd pain that radiates to umbilicus since christmas. Pt states he has had nausea and vomiting. Pt is unsure of fever. Pt states has had diarrhea.

## 2018-08-17 ENCOUNTER — Emergency Department
Admission: EM | Admit: 2018-08-17 | Discharge: 2018-08-17 | Disposition: A | Discharge status: Home / Self Care | Payer: Medicaid Other | Attending: Emergency Medicine | Admitting: Emergency Medicine

## 2018-08-17 ENCOUNTER — Emergency Department: Payer: Medicaid Other

## 2018-08-17 DIAGNOSIS — I10 Essential (primary) hypertension: Secondary | ICD-10-CM

## 2018-08-17 DIAGNOSIS — R1030 Lower abdominal pain, unspecified: Secondary | ICD-10-CM

## 2018-08-17 MED ORDER — IOPAMIDOL (ISOVUE-300) INJECTION 61%
30.0000 mL | Freq: Once | INTRAVENOUS | Status: AC | PRN
  Administered 2018-08-17: 30 mL via ORAL

## 2018-08-17 MED ORDER — IOPAMIDOL (ISOVUE-300) INJECTION 61%
100.0000 mL | Freq: Once | INTRAVENOUS | Status: AC | PRN
  Administered 2018-08-17: 100 mL via INTRAVENOUS

## 2018-08-17 MED ORDER — DICYCLOMINE HCL 10 MG PO CAPS: 10.0000 mg | ORAL_CAPSULE | Freq: Three times a day (TID) | ORAL | 0 refills | Status: DC | PRN

## 2018-08-17 MED ORDER — ONDANSETRON HCL 4 MG/2ML IJ SOLN
4.0000 mg | INTRAMUSCULAR | Status: AC
  Administered 2018-08-17: 4 mg via INTRAVENOUS
  Filled 2018-08-17: qty 2

## 2018-08-17 MED ORDER — KETOROLAC TROMETHAMINE 30 MG/ML IJ SOLN
15.0000 mg | Freq: Once | INTRAMUSCULAR | Status: AC
  Administered 2018-08-17: 15 mg via INTRAVENOUS
  Filled 2018-08-17: qty 1

## 2018-08-17 NOTE — ED Notes (Signed)
Patient transported to CT 

## 2018-08-17 NOTE — Discharge Instructions (Signed)
You have been seen in the Emergency Department (ED) for abdominal pain.  Your evaluation did not identify a clear cause of your symptoms but was generally reassuring.  Your blood pressure was elevated, and you should continue taking your medication, but the rest of your results were within normal limits.  Please follow up as instructed above regarding todays emergent visit and the symptoms that are bothering you.  Return to the ED if your abdominal pain worsens or fails to improve, you develop bloody vomiting, bloody diarrhea, you are unable to tolerate fluids due to vomiting, fever greater than 101, or other symptoms that concern you.

## 2018-08-17 NOTE — ED Provider Notes (Signed)
[facility] Emergency Department Provider Note  ____________________________________________   First MD Initiated Contact with Patient 08/17/18 [phone]     (approximate)  I have reviewed the triage vital signs and the nursing notes.   HISTORY  Chief Complaint Abdominal Pain    HPI Jeremy Yoder is a 38 y.o. male with medical history as listed below who presents for evaluation of abdominal pain.  He states that he has had moderate to severe periumbilical and left lower quadrant abdominal pain for about 2 weeks.  It is been constant and nothing in particular makes it better or worse.  He says he has not had much appetite.  He denies nausea, vomiting, diarrhea, constipation, dysuria, fever/chills, chest pain, shortness of breath.  He has not had abdominal pain in the past.  He states he has been taking his blood pressure medicine although of note his medical record indicates a history of noncompliance.  He denies fever and chills.  Past Medical History:  Diagnosis Date  . A-fib (HCC)   . Hypertension    a. Noncompliant w/ meds.  . Seizures (HCC)    a. Dx in teens. Noncompliant w/ meds.  . Tobacco abuse     Patient Active Problem List   Diagnosis Date Noted  . New onset atrial fibrillation (HCC) 01/03/2018  . Essential hypertension 01/03/2018  . Seizure disorder (HCC) 01/03/2018    Past Surgical History:  Procedure Laterality Date  . arm    . EYE SURGERY    . FOREARM SURGERY    . KNEE SURGERY      Prior to Admission medications   Medication Sig Start Date End Date Taking? Authorizing Provider  acetaminophen (TYLENOL) 500 MG tablet Take 1 tablet (500 mg total) by mouth every 6 (six) hours as needed. 07/17/18   Enid DerryWagner, Ashley, PA-C  amoxicillin (AMOXIL) 500 MG tablet Take 1 tablet (500 mg total) by mouth 3 (three) times daily. 03/06/18   Triplett, Rulon Eisenmengerari B, FNP  chlorhexidine (PERIDEX) 0.12 % solution Use as directed 15 mLs in the mouth or throat 2 (two)  times daily. 03/06/18   Chinita Pesterriplett, Cari B, FNP  cyclobenzaprine (FLEXERIL) 5 MG tablet Take 1-2 tablets 3 times daily as needed 07/17/18   Enid DerryWagner, Ashley, PA-C  dicyclomine (BENTYL) 10 MG capsule Take 1 capsule (10 mg total) by mouth 3 (three) times daily as needed for up to 14 days for spasms. or abdominal pain 08/17/18 08/31/18  Loleta RoseForbach, Elliemae Braman, MD  levETIRAcetam (KEPPRA) 500 MG tablet Take 1 tablet (500 mg total) by mouth 2 (two) times daily. 03/20/18   Loleta RoseForbach, Shep Porter, MD  metoprolol succinate (TOPROL-XL) 50 MG 24 hr tablet Take 1 tablet (50 mg total) by mouth daily. Take with or immediately following a meal. 03/20/18   Loleta RoseForbach, Riley Hallum, MD  oxyCODONE-acetaminophen (PERCOCET/ROXICET) 5-325 MG tablet Take 1 tablet by mouth every 4 (four) hours as needed for severe pain. 05/15/18   Irean HongSung, Jade J, MD  rivaroxaban (XARELTO) 20 MG TABS tablet Take 1 tablet (20 mg total) by mouth daily. 01/03/18   Altamese DillingVachhani, Vaibhavkumar, MD  traMADol (ULTRAM) 50 MG tablet Take 1 tablet (50 mg total) by mouth every 6 (six) hours as needed. 03/06/18   Triplett, Rulon Eisenmengerari B, FNP    Allergies Aspirin and Ibuprofen  Family History  Problem Relation Age of Onset  . Hypertension Mother   . Diabetes Mother   . Atrial fibrillation Mother   . Heart disease Father  Pt doesn't know much about father's history but says he has either a PPM or ICD.    Social History Social History   Tobacco Use  . Smoking status: Current Every Day Smoker    Packs/day: 0.50    Years: 20.00    Pack years: 10.00  . Smokeless tobacco: Never Used  . Tobacco comment: currently smoking 0.5 - 1 ppd.  Substance Use Topics  . Alcohol use: No  . Drug use: No    Review of Systems Constitutional: No fever/chills Eyes: No visual changes. ENT: No sore throat. Cardiovascular: Denies chest pain. Respiratory: Denies shortness of breath. Gastrointestinal: Lower abdominal pain on the left and in the middle around his umbilicus for about 2 weeks.  No nausea or  vomiting. Genitourinary: Negative for dysuria. Musculoskeletal: Negative for neck pain.  Negative for back pain. Integumentary: Negative for rash. Neurological: Negative for headaches, focal weakness or numbness.   ____________________________________________   PHYSICAL EXAM:  VITAL SIGNS: ED Triage Vitals [08/16/18 2114]  Enc Vitals Group     BP (!) 189/108     Pulse Rate 90     Resp 18     Temp 98.5 F (36.9 C)     Temp Source Oral     SpO2 100 %     Weight 80.7 kg (178 lb)     Height 1.829 m (6')     Head Circumference      Peak Flow      Pain Score 8     Pain Loc      Pain Edu?      Excl. in GC?     Constitutional: Alert and oriented. Well appearing and in no acute distress. Eyes: Conjunctivae are normal.  Head: Atraumatic. Nose: No congestion/rhinnorhea. Mouth/Throat: Mucous membranes are moist. Neck: No stridor.  No meningeal signs.   Cardiovascular: Normal rate, regular rhythm. Good peripheral circulation. Grossly normal heart sounds. Respiratory: Normal respiratory effort.  No retractions. Lungs CTAB. Gastrointestinal: Soft with diffuse tenderness palpation of the lower abdomen worse in the left lower quadrant but no rebound and no guarding. Musculoskeletal: No lower extremity tenderness nor edema. No gross deformities of extremities. Neurologic:  Normal speech and language. No gross focal neurologic deficits are appreciated.  Skin:  Skin is warm, dry and intact. No rash noted. Psychiatric: Mood and affect are normal. Speech and behavior are normal.  ____________________________________________   LABS (all labs ordered are listed, but only abnormal results are displayed)  Labs Reviewed  COMPREHENSIVE METABOLIC PANEL - Abnormal; Notable for the following components:      Result Value   Glucose, Bld 120 (*)    Calcium 8.7 (*)    All other components within normal limits  URINALYSIS, COMPLETE (UACMP) WITH MICROSCOPIC - Abnormal; Notable for the following  components:   Color, Urine YELLOW (*)    APPearance HAZY (*)    Bacteria, UA RARE (*)    All other components within normal limits  LIPASE, BLOOD  CBC   ____________________________________________  EKG  ED ECG REPORT I, Loleta Roseory Abdulaziz Toman, the attending physician, personally viewed and interpreted this ECG.  Date: 08/16/2018 EKG Time: 21:25 Rate: 95 Rhythm: normal sinus rhythm QRS Axis: normal Intervals: normal ST/T Wave abnormalities: normal Narrative Interpretation: no evidence of acute ischemia  ____________________________________________  RADIOLOGY I, Loleta Roseory Syrah Daughtrey, personally viewed and evaluated these images (plain radiographs) as part of my medical decision making, as well as reviewing the written report by the radiologist.  ED MD interpretation: No acute  or emergent abnormalities identified on chest CT.  Official radiology report(s): Ct Abdomen Pelvis W Contrast  Result Date: 08/17/2018 CLINICAL DATA:  Initial evaluation for acute generalized abdominal pain. EXAM: CT ABDOMEN AND PELVIS WITH CONTRAST TECHNIQUE: Multidetector CT imaging of the abdomen and pelvis was performed using the standard protocol following bolus administration of intravenous contrast. CONTRAST:  ISOVUE-300 IOPAMIDOL (ISOVUE-300) INJECTION 61% COMPARISON:  Prior CT from 09/19/2010.  In FINDINGS: Lower chest: Visualized lung bases are clear. Hepatobiliary: Liver demonstrates a normal contrast enhanced appearance. Gallbladder within normal limits. No biliary dilatation. Pancreas: Pancreas within normal limits. Spleen: Punctate calcified granuloma noted within the spleen. Spleen otherwise unremarkable. Adrenals/Urinary Tract: Adrenal glands are normal. Kidneys equal in size with symmetric enhancement. No nephrolithiasis, hydronephrosis, or focal enhancing renal mass. No hydroureter. Partially distended bladder within normal limits. Stomach/Bowel: Stomach moderately distended without acute finding. Small  bowel of normal caliber without evidence for obstruction. Appendix within normal limits. No abnormal wall thickening, mucosal enhancement, or inflammatory fat stranding seen about the bowels. Vascular/Lymphatic: Moderate aorto bi-iliac atherosclerotic disease, advanced for age. No aneurysm. Mesenteric vessels patent proximally. No adenopathy. Reproductive: Prostate mildly heterogeneous but within normal limits for size in appearance. Other: No free air or fluid. Small fat containing right inguinal hernia noted. Musculoskeletal: No acute osseous abnormality. No discrete lytic or blastic osseous lesions. IMPRESSION: 1. No CT evidence for acute intra-abdominal or pelvic process. 2. Moderate aorto bi-iliac atherosclerotic disease, advanced for patient age. No aneurysm. Electronically Signed   By: Rise Mu M.D.   On: 08/17/2018 04:28    ____________________________________________   PROCEDURES  Critical Care performed: No   Procedure(s) performed:   Procedures   ____________________________________________   INITIAL IMPRESSION / ASSESSMENT AND PLAN / ED COURSE  As part of my medical decision making, I reviewed the following data within the electronic MEDICAL RECORD NUMBER Nursing notes reviewed and incorporated, Labs reviewed , EKG interpreted , Old chart reviewed, Notes from prior ED visits and [place] Controlled Substance Database    Differential diagnosis includes, but is not limited to, musculoskeletal pain, diverticulitis, less likely appendicitis or renal colic.  The patient has had 8 emergency department visits in the last 6 months for variety of pain complaints but he is not presented in the past for abdominal pain.  His lab work is all within normal limits which is reassuring given that he has been having symptoms for 2 weeks.  However I have had patients with acute diverticulitis present with a gradual and slowly developing course and without leukocytosis.  The symptoms and location  of the pain he is describing is most consistent with diverticulitis.  I suggested we check a CT scan with oral and IV contrast to rule out acute infection.  I will give him a dose of Toradol 15 mg IV and Zofran 4 mg IV.  I will then reassess after the imaging.  He agrees with this plan.  Clinical Course as of Aug 18 443  Sat Aug 17, 2018  0981 CT scan is unremarkable.  Patient is sitting up in bed watching TV in no apparent distress.  I reviewed his results with him and encouraged outpatient follow-up given no sign of acute or emergent medical condition.  He states he understands and agrees.  CT ABDOMEN PELVIS W CONTRAST [CF]    Clinical Course User Index [CF] Loleta Rose, MD    ____________________________________________  FINAL CLINICAL IMPRESSION(S) / ED DIAGNOSES  Final diagnoses:  Lower abdominal pain  Essential hypertension  MEDICATIONS GIVEN DURING THIS VISIT:  Medications  ketorolac (TORADOL) 30 MG/ML injection 15 mg (15 mg Intravenous Given 08/17/18 0256)  ondansetron (ZOFRAN) injection 4 mg (4 mg Intravenous Given 08/17/18 0256)  iopamidol (ISOVUE-300) 61 % injection 30 mL (30 mLs Oral Contrast Given 08/17/18 0321)  iopamidol (ISOVUE-300) 61 % injection 100 mL (100 mLs Intravenous Contrast Given 08/17/18 0408)     ED Discharge Orders         Ordered    dicyclomine (BENTYL) 10 MG capsule  3 times daily PRN     08/17/18 0444           Note:  This document was prepared using Dragon voice recognition software and may include unintentional dictation errors.    Loleta Rose, MD 08/17/18 [phone]

## 2018-09-16 ENCOUNTER — Emergency Department
Admission: EM | Admit: 2018-09-16 | Discharge: 2018-09-17 | Disposition: A | Discharge status: Home / Self Care | Payer: Medicaid Other | Attending: Emergency Medicine | Admitting: Emergency Medicine

## 2018-09-16 ENCOUNTER — Encounter: Payer: Self-pay | Admitting: *Deleted

## 2018-09-16 ENCOUNTER — Other Ambulatory Visit: Payer: Self-pay

## 2018-09-16 DIAGNOSIS — F17200 Nicotine dependence, unspecified, uncomplicated: Secondary | ICD-10-CM | POA: Diagnosis not present

## 2018-09-16 DIAGNOSIS — R569 Unspecified convulsions: Secondary | ICD-10-CM

## 2018-09-16 DIAGNOSIS — Z79899 Other long term (current) drug therapy: Secondary | ICD-10-CM | POA: Diagnosis not present

## 2018-09-16 DIAGNOSIS — I10 Essential (primary) hypertension: Secondary | ICD-10-CM | POA: Diagnosis not present

## 2018-09-16 LAB — CBC
HCT: 50.9 % (ref 39.0–52.0)
Hemoglobin: 16.9 g/dL (ref 13.0–17.0)
MCH: 30.1 pg (ref 26.0–34.0)
MCHC: 33.2 g/dL (ref 30.0–36.0)
MCV: 90.7 fL (ref 80.0–100.0)
Platelets: 218 10*3/uL (ref 150–400)
RBC: 5.61 MIL/uL (ref 4.22–5.81)
RDW: 13.5 % (ref 11.5–15.5)
WBC: 8.7 10*3/uL (ref 4.0–10.5)
nRBC: 0 % (ref 0.0–0.2)

## 2018-09-16 LAB — BASIC METABOLIC PANEL
Anion gap: 8 (ref 5–15)
BUN: 13 mg/dL (ref 6–20)
CO2: 29 mmol/L (ref 22–32)
Calcium: 9.1 mg/dL (ref 8.9–10.3)
Chloride: 103 mmol/L (ref 98–111)
Creatinine, Ser: 0.92 mg/dL (ref 0.61–1.24)
GFR calc Af Amer: >60 mL/min (ref >60)
Glucose, Bld: 69 mg/dL — ABNORMAL LOW (ref 70–99)
Potassium: 3.2 mmol/L — ABNORMAL LOW (ref 3.5–5.1)
Sodium: 140 mmol/L (ref 135–145)

## 2018-09-16 LAB — GLUCOSE, CAPILLARY: Glucose-Capillary: 124 mg/dL — ABNORMAL HIGH (ref 70–99)

## 2018-09-16 MED ORDER — LEVETIRACETAM IN NACL 1000 MG/100ML IV SOLN
1000.0000 mg | Freq: Once | INTRAVENOUS | Status: AC
  Administered 2018-09-16: 1000 mg via INTRAVENOUS
  Filled 2018-09-16: qty 100

## 2018-09-16 NOTE — ED Provider Notes (Signed)
[facility] Emergency Department Provider Note   ____________________________________________   I have reviewed the triage vital signs and the nursing notes.   HISTORY  Chief Complaint Seizures   History limited by: Not Limited   HPI Jeremy Yoder is a 38 y.o. male who presents to the emergency department today because of concerns for seizure-like activity.  Patient states he does have a history of seizures.  The patient thinks that he might have missed his Keppra dose yesterday.  He states he also has not been sleeping very well.  He denies any alcohol or other drug use but does smoke. He states that he has felt off today since the seizure. States he is on blood pressure medication and has been taking that as prescribed. The patient denies any fevers.    Per medical record review patient has a history of afib, htn, seizures.   Past Medical History:  Diagnosis Date  . A-fib (HCC)   . Hypertension    a. Noncompliant w/ meds.  . Seizures (HCC)    a. Dx in teens. Noncompliant w/ meds.  . Tobacco abuse     Patient Active Problem List   Diagnosis Date Noted  . New onset atrial fibrillation (HCC) 01/03/2018  . Essential hypertension 01/03/2018  . Seizure disorder (HCC) 01/03/2018    Past Surgical History:  Procedure Laterality Date  . arm    . EYE SURGERY    . FOREARM SURGERY    . KNEE SURGERY      Prior to Admission medications   Medication Sig Start Date End Date Taking? Authorizing Provider  acetaminophen (TYLENOL) 500 MG tablet Take 1 tablet (500 mg total) by mouth every 6 (six) hours as needed. 07/17/18  Yes Enid DerryWagner, Ashley, PA-C  cyclobenzaprine (FLEXERIL) 5 MG tablet Take 1-2 tablets 3 times daily as needed 07/17/18  Yes Enid DerryWagner, Ashley, PA-C  levETIRAcetam (KEPPRA) 500 MG tablet Take 1 tablet (500 mg total) by mouth 2 (two) times daily. 03/20/18  Yes Loleta RoseForbach, Cory, MD  metoprolol succinate (TOPROL-XL) 50 MG 24 hr tablet Take 1 tablet (50 mg  total) by mouth daily. Take with or immediately following a meal. 03/20/18  Yes Loleta RoseForbach, Cory, MD  rivaroxaban (XARELTO) 20 MG TABS tablet Take 1 tablet (20 mg total) by mouth daily. 01/03/18  Yes Altamese DillingVachhani, Vaibhavkumar, MD  amoxicillin (AMOXIL) 500 MG tablet Take 1 tablet (500 mg total) by mouth 3 (three) times daily. Patient not taking: Reported on 09/16/2018 03/06/18   Kem Boroughsriplett, Cari B, FNP  chlorhexidine (PERIDEX) 0.12 % solution Use as directed 15 mLs in the mouth or throat 2 (two) times daily. Patient not taking: Reported on 09/16/2018 03/06/18   Kem Boroughsriplett, Cari B, FNP  dicyclomine (BENTYL) 10 MG capsule Take 1 capsule (10 mg total) by mouth 3 (three) times daily as needed for up to 14 days for spasms. or abdominal pain 08/17/18 08/31/18  Loleta RoseForbach, Cory, MD  oxyCODONE-acetaminophen (PERCOCET/ROXICET) 5-325 MG tablet Take 1 tablet by mouth every 4 (four) hours as needed for severe pain. Patient not taking: Reported on 09/16/2018 05/15/18   Irean HongSung, Jade J, MD  traMADol (ULTRAM) 50 MG tablet Take 1 tablet (50 mg total) by mouth every 6 (six) hours as needed. Patient not taking: Reported on 09/16/2018 03/06/18   Kem Boroughsriplett, Cari B, FNP    Allergies Aspirin and Ibuprofen  Family History  Problem Relation Age of Onset  . Hypertension Mother   . Diabetes Mother   . Atrial fibrillation Mother   .  Heart disease Father        Pt doesn't know much about father's history but says he has either a PPM or ICD.    Social History Social History   Tobacco Use  . Smoking status: Current Every Day Smoker    Packs/day: 0.50    Years: 20.00    Pack years: 10.00  . Smokeless tobacco: Never Used  . Tobacco comment: currently smoking 0.5 - 1 ppd.  Substance Use Topics  . Alcohol use: No  . Drug use: No    Review of Systems Constitutional: No fever/chills Eyes: No visual changes. ENT: No sore throat. Cardiovascular: Denies chest pain. Respiratory: Denies shortness of breath. Gastrointestinal: No abdominal pain.   No nausea, no vomiting.  No diarrhea.   Genitourinary: Negative for dysuria. Musculoskeletal: Negative for back pain. Skin: Negative for rash. Neurological: Negative for headaches, focal weakness or numbness.  ____________________________________________   PHYSICAL EXAM:  VITAL SIGNS: ED Triage Vitals [09/16/18 2215]  Enc Vitals Group     BP (!) 219/117     Pulse Rate 81     Resp 15     Temp 97.9 F (36.6 C)     Temp Source Oral     SpO2 100 %     Weight 180 lb (81.6 kg)     Height 6' (1.829 m)     Head Circumference      Peak Flow      Pain Score 0   Constitutional: Alert and oriented.  Eyes: Conjunctivae are normal.  ENT      Head: Normocephalic and atraumatic.      Nose: No congestion/rhinnorhea.      Mouth/Throat: Mucous membranes are moist.      Neck: No stridor. Hematological/Lymphatic/Immunilogical: No cervical lymphadenopathy. Cardiovascular: Normal rate, regular rhythm.  No murmurs, rubs, or gallops.  Respiratory: Normal respiratory effort without tachypnea nor retractions. Breath sounds are clear and equal bilaterally. No wheezes/rales/rhonchi. Gastrointestinal: Soft and non tender. No rebound. No guarding.  Genitourinary: Deferred Musculoskeletal: Normal range of motion in all extremities. No lower extremity edema. Neurologic:  Normal speech and language. No gross focal neurologic deficits are appreciated.  Skin:  Skin is warm, dry and intact. No rash noted. Psychiatric: Mood and affect are normal. Speech and behavior are normal. Patient exhibits appropriate insight and judgment.  ____________________________________________    LABS (pertinent positives/negatives)  CBC wbc 8.7, hgb 16.9, plt 218 BMP wnl except k 3.2, glu 69  ____________________________________________   EKG  I, Phineas SemenGraydon Danon Lograsso, attending physician, personally viewed and interpreted this EKG  EKG Time: 2216 Rate: 85 Rhythm: sinus rhythm Axis: normal Intervals: qtc 457 QRS:  narrow ST changes: no st elevation Impression: abnormal ekg  ____________________________________________    RADIOLOGY  None  ____________________________________________   PROCEDURES  Procedures  ____________________________________________   INITIAL IMPRESSION / ASSESSMENT AND PLAN / ED COURSE  Pertinent labs & imaging results that were available during my care of the patient were reviewed by me and considered in my medical decision making (see chart for details).   Patient presented to the emergency department today because of concerns for seizure.  Patient does have a history of epilepsy.  He thinks he did miss his seizure medication yesterday.  Blood work here without any concerning electrolyte abnormalities.  At this point I think likely seizure secondary to medication dose being missed.  Patient was given bolus of Keppra here.  Will discharge to follow-up with primary care physicians.  ____________________________________________   FINAL CLINICAL  IMPRESSION(S) / ED DIAGNOSES  Final diagnoses:  Seizure-like activity (HCC)  Hypertension, unspecified type     Note: This dictation was prepared with Dragon dictation. Any transcriptional errors that result from this process are unintentional     Phineas Semen, MD 09/17/18 [phone]

## 2018-09-16 NOTE — Discharge Instructions (Signed)
Please seek medical attention for any high fevers, chest pain, shortness of breath, change in behavior, persistent vomiting, bloody stool or any other new or concerning symptoms.  

## 2018-09-16 NOTE — ED Triage Notes (Signed)
He has multiple witnessed seizures today, last was en route to ED. When pt was initially assisted out of vehicle by staff member he was not speaking, but then able to answer questions appropriately. Pt takes Keppra for seizures. Reports taking his meds as scheduled, last seizure was several month ago.

## 2018-10-22 ENCOUNTER — Other Ambulatory Visit: Payer: Self-pay

## 2018-10-22 ENCOUNTER — Encounter: Payer: Self-pay | Admitting: Emergency Medicine

## 2018-10-22 DIAGNOSIS — S61012A Laceration without foreign body of left thumb without damage to nail, initial encounter: Secondary | ICD-10-CM | POA: Insufficient documentation

## 2018-10-22 DIAGNOSIS — Z7901 Long term (current) use of anticoagulants: Secondary | ICD-10-CM | POA: Insufficient documentation

## 2018-10-22 DIAGNOSIS — Y9389 Activity, other specified: Secondary | ICD-10-CM | POA: Diagnosis not present

## 2018-10-22 DIAGNOSIS — Z23 Encounter for immunization: Secondary | ICD-10-CM | POA: Insufficient documentation

## 2018-10-22 DIAGNOSIS — I10 Essential (primary) hypertension: Secondary | ICD-10-CM | POA: Diagnosis not present

## 2018-10-22 DIAGNOSIS — F17200 Nicotine dependence, unspecified, uncomplicated: Secondary | ICD-10-CM | POA: Insufficient documentation

## 2018-10-22 DIAGNOSIS — Y929 Unspecified place or not applicable: Secondary | ICD-10-CM | POA: Diagnosis not present

## 2018-10-22 DIAGNOSIS — Y999 Unspecified external cause status: Secondary | ICD-10-CM | POA: Insufficient documentation

## 2018-10-22 DIAGNOSIS — Z79899 Other long term (current) drug therapy: Secondary | ICD-10-CM | POA: Insufficient documentation

## 2018-10-22 DIAGNOSIS — W268XXA Contact with other sharp object(s), not elsewhere classified, initial encounter: Secondary | ICD-10-CM | POA: Diagnosis not present

## 2018-10-22 NOTE — ED Triage Notes (Signed)
Pt arrived to the ED for a laceration the the first digit of the left hand. Pt is AOx4 in no apparent distress, no bleeding at this time.

## 2018-10-23 ENCOUNTER — Emergency Department
Admission: EM | Admit: 2018-10-23 | Discharge: 2018-10-23 | Disposition: A | Discharge status: Home / Self Care | Payer: Medicaid Other | Attending: Emergency Medicine | Admitting: Emergency Medicine

## 2018-10-23 DIAGNOSIS — S61012A Laceration without foreign body of left thumb without damage to nail, initial encounter: Secondary | ICD-10-CM

## 2018-10-23 MED ORDER — TETANUS-DIPHTH-ACELL PERTUSSIS 5-2.5-18.5 LF-MCG/0.5 IM SUSP
0.5000 mL | Freq: Once | INTRAMUSCULAR | Status: AC
  Administered 2018-10-23: 0.5 mL via INTRAMUSCULAR
  Filled 2018-10-23: qty 0.5

## 2018-10-23 MED ORDER — LIDOCAINE HCL (PF) 1 % IJ SOLN
5.0000 mL | Freq: Once | INTRAMUSCULAR | Status: AC
  Administered 2018-10-23: 5 mL via INTRADERMAL
  Filled 2018-10-23: qty 5

## 2018-10-23 NOTE — Discharge Instructions (Addendum)
Keep wound clean and dry.  You can wash your hands but do not submerge your hand in standing water.  Return for any increasing pain swelling redness or other signs of infection.  Have the stitches checked in about 2 days and out in 7 to 10 days.  Tylenol or Motrin if needed for pain.

## 2018-10-23 NOTE — ED Notes (Signed)
Pt unsure of last tetanus shot date. MD notified and verbal orders for tdap shot placed.

## 2018-10-23 NOTE — ED Provider Notes (Signed)
[facility] Emergency Department Provider Note   ____________________________________________   First MD Initiated Contact with Patient 10/23/18 0159     (approximate)  I have reviewed the triage vital signs and the nursing notes.   HISTORY  Chief Complaint Laceration    HPI Jeremy Yoder is a 38 y.o. male patient reports he was opening a can of food when 1 of his children came up and distracted to me cut his left pad of his thumb on the can.  Otherwise he feels okay.  No other injuries.         Past Medical History:  Diagnosis Date  . A-fib (HCC)   . Hypertension    a. Noncompliant w/ meds.  . Seizures (HCC)    a. Dx in teens. Noncompliant w/ meds.  . Tobacco abuse     Patient Active Problem List   Diagnosis Date Noted  . New onset atrial fibrillation (HCC) 01/03/2018  . Essential hypertension 01/03/2018  . Seizure disorder (HCC) 01/03/2018    Past Surgical History:  Procedure Laterality Date  . arm    . EYE SURGERY    . FOREARM SURGERY    . KNEE SURGERY      Prior to Admission medications   Medication Sig Start Date End Date Taking? Authorizing Provider  acetaminophen (TYLENOL) 500 MG tablet Take 1 tablet (500 mg total) by mouth every 6 (six) hours as needed. 07/17/18   Enid Derry, PA-C  amoxicillin (AMOXIL) 500 MG tablet Take 1 tablet (500 mg total) by mouth 3 (three) times daily. Patient not taking: Reported on 09/16/2018 03/06/18   Kem Boroughs B, FNP  chlorhexidine (PERIDEX) 0.12 % solution Use as directed 15 mLs in the mouth or throat 2 (two) times daily. Patient not taking: Reported on 09/16/2018 03/06/18   Chinita Pester, FNP  cyclobenzaprine (FLEXERIL) 5 MG tablet Take 1-2 tablets 3 times daily as needed 07/17/18   Enid Derry, PA-C  dicyclomine (BENTYL) 10 MG capsule Take 1 capsule (10 mg total) by mouth 3 (three) times daily as needed for up to 14 days for spasms. or abdominal pain 08/17/18 08/31/18  Loleta Rose, MD    levETIRAcetam (KEPPRA) 500 MG tablet Take 1 tablet (500 mg total) by mouth 2 (two) times daily. 03/20/18   Loleta Rose, MD  metoprolol succinate (TOPROL-XL) 50 MG 24 hr tablet Take 1 tablet (50 mg total) by mouth daily. Take with or immediately following a meal. 03/20/18   Loleta Rose, MD  oxyCODONE-acetaminophen (PERCOCET/ROXICET) 5-325 MG tablet Take 1 tablet by mouth every 4 (four) hours as needed for severe pain. Patient not taking: Reported on 09/16/2018 05/15/18   Irean Hong, MD  rivaroxaban (XARELTO) 20 MG TABS tablet Take 1 tablet (20 mg total) by mouth daily. 01/03/18   Altamese Dilling, MD  traMADol (ULTRAM) 50 MG tablet Take 1 tablet (50 mg total) by mouth every 6 (six) hours as needed. Patient not taking: Reported on 09/16/2018 03/06/18   Kem Boroughs B, FNP    Allergies Aspirin and Ibuprofen  Family History  Problem Relation Age of Onset  . Hypertension Mother   . Diabetes Mother   . Atrial fibrillation Mother   . Heart disease Father        Pt doesn't know much about father's history but says he has either a PPM or ICD.    Social History Social History   Tobacco Use  . Smoking status: Current Every Day Smoker  Packs/day: 0.50    Years: 20.00    Pack years: 10.00  . Smokeless tobacco: Never Used  . Tobacco comment: currently smoking 0.5 - 1 ppd.  Substance Use Topics  . Alcohol use: No  . Drug use: No    Review of Systems  Constitutional: No fever/chills Eyes: No visual changes. ENT: No sore throat. Cardiovascular: Denies chest pain. Respiratory: Denies shortness of breath. Gastrointestinal: No abdominal pain.  No nausea, no vomiting.  No diarrhea.  No constipation. Genitourinary: Negative for dysuria. Musculoskeletal: Negative for back pain. Skin: Negative for rash. Neurological: Negative for headaches, focal weakness   ____________________________________________   PHYSICAL EXAM:  VITAL SIGNS: ED Triage Vitals [10/22/18 2342]  Enc Vitals  Group     BP (!) 160/107     Pulse Rate (!) 105     Resp 16     Temp 97.8 F (36.6 C)     Temp Source Oral     SpO2 99 %     Weight 182 lb (82.6 kg)     Height 6' (1.829 m)     Head Circumference      Peak Flow      Pain Score 0     Pain Loc      Pain Edu?      Excl. in GC?    Constitutional: Alert and oriented. Well appearing and in no acute distress. Eyes: Conjunctivae are normal.  Head: Atraumatic. Nose: No congestion/rhinnorhea. Mouth/Throat: Mucous membranes are moist.  Oropharynx non-erythematous. Neck: No stridor. Cardiovascular: Normal rate, regular rhythm. Grossly normal heart sounds.  Good peripheral circulation. Respiratory: Normal respiratory effort.  No retractions. Lungs CTAB. Musculoskeletal: Left thumb with a 1-1/2 cm long vertical laceration on the palmar surface of the distal phalanx.  It is through the skin but does not reach the bone.  Does not affect the tendon. Neurologic:  Normal speech and language. No gross focal neurologic deficits are appreciated.  Skin:  Skin is warm, dry and intact. No rash noted. Psychiatric: Mood and affect are normal. Speech and behavior are normal.  ____________________________________________   LABS (all labs ordered are listed, but only abnormal results are displayed)  Labs Reviewed - No data to display ____________________________________________  EKG   ____________________________________________  RADIOLOGY  ED MD interpretation:   Official radiology report(s): No results found.  ____________________________________________   PROCEDURES  Procedure(s) performed (including Critical Care):  Procedure: Oral consent obtained.  Neurovascularly intact in the fingertip.  Good capillary refill.  Digital block was done after the finger was cleaned with Betadine and then alcohol wipes.  Incomplete anesthesia was obtained.  Patient asked that I just anesthetized the cut which was done.  This made everything nice and  numb.  Wound was closed with 5 stitches of 4-0 nylon.  Patient tolerated well.   ____________________________________________   INITIAL IMPRESSION / ASSESSMENT AND PLAN / ED COURSE  Advised the patient to stop smoking and this will make his wound heal faster.  Recheck in 2 days stitches out in 7 to 10 days.  Return for any signs of infection.              ____________________________________________   FINAL CLINICAL IMPRESSION(S) / ED DIAGNOSES  Final diagnoses:  Laceration of left thumb without foreign body without damage to nail, initial encounter     ED Discharge Orders    None       Note:  This document was prepared using Dragon voice recognition software and may include unintentional  dictation errors.    Arnaldo Natal, MD 10/23/18 [phone]

## 2018-11-01 ENCOUNTER — Emergency Department
Admission: EM | Admit: 2018-11-01 | Discharge: 2018-11-01 | Disposition: A | Discharge status: Home / Self Care | Payer: Medicaid Other | Attending: Student in an Organized Health Care Education/Training Program | Admitting: Student in an Organized Health Care Education/Training Program

## 2018-11-01 ENCOUNTER — Encounter: Payer: Self-pay | Admitting: *Deleted

## 2018-11-01 ENCOUNTER — Other Ambulatory Visit: Payer: Self-pay

## 2018-11-01 DIAGNOSIS — Z4802 Encounter for removal of sutures: Secondary | ICD-10-CM

## 2018-11-01 NOTE — ED Provider Notes (Signed)
[facility] Emergency Department Provider Note  ____________________________________________  Time seen: Approximately 6:14 PM  I have reviewed the triage vital signs and the nursing notes.   HISTORY  Chief Complaint Suture / Staple Removal    HPI Jeremy Yoder is a 38 y.o. male that presents emergency department for suture removal.  No drainage, fever.   Past Medical History:  Diagnosis Date  . A-fib (HCC)   . Hypertension    a. Noncompliant w/ meds.  . Seizures (HCC)    a. Dx in teens. Noncompliant w/ meds.  . Tobacco abuse     Patient Active Problem List   Diagnosis Date Noted  . New onset atrial fibrillation (HCC) 01/03/2018  . Essential hypertension 01/03/2018  . Seizure disorder (HCC) 01/03/2018    Past Surgical History:  Procedure Laterality Date  . arm    . EYE SURGERY    . FOREARM SURGERY    . KNEE SURGERY      Prior to Admission medications   Medication Sig Start Date End Date Taking? Authorizing Provider  acetaminophen (TYLENOL) 500 MG tablet Take 1 tablet (500 mg total) by mouth every 6 (six) hours as needed. 07/17/18   Enid Derry, PA-C  amoxicillin (AMOXIL) 500 MG tablet Take 1 tablet (500 mg total) by mouth 3 (three) times daily. Patient not taking: Reported on 09/16/2018 03/06/18   Kem Boroughs B, FNP  chlorhexidine (PERIDEX) 0.12 % solution Use as directed 15 mLs in the mouth or throat 2 (two) times daily. Patient not taking: Reported on 09/16/2018 03/06/18   Chinita Pester, FNP  cyclobenzaprine (FLEXERIL) 5 MG tablet Take 1-2 tablets 3 times daily as needed 07/17/18   Enid Derry, PA-C  dicyclomine (BENTYL) 10 MG capsule Take 1 capsule (10 mg total) by mouth 3 (three) times daily as needed for up to 14 days for spasms. or abdominal pain 08/17/18 08/31/18  Loleta Rose, MD  levETIRAcetam (KEPPRA) 500 MG tablet Take 1 tablet (500 mg total) by mouth 2 (two) times daily. 03/20/18   Loleta Rose, MD  metoprolol succinate  (TOPROL-XL) 50 MG 24 hr tablet Take 1 tablet (50 mg total) by mouth daily. Take with or immediately following a meal. 03/20/18   Loleta Rose, MD  oxyCODONE-acetaminophen (PERCOCET/ROXICET) 5-325 MG tablet Take 1 tablet by mouth every 4 (four) hours as needed for severe pain. Patient not taking: Reported on 09/16/2018 05/15/18   Irean Hong, MD  rivaroxaban (XARELTO) 20 MG TABS tablet Take 1 tablet (20 mg total) by mouth daily. 01/03/18   Altamese Dilling, MD  traMADol (ULTRAM) 50 MG tablet Take 1 tablet (50 mg total) by mouth every 6 (six) hours as needed. Patient not taking: Reported on 09/16/2018 03/06/18   Kem Boroughs B, FNP    Allergies Aspirin and Ibuprofen  Family History  Problem Relation Age of Onset  . Hypertension Mother   . Diabetes Mother   . Atrial fibrillation Mother   . Heart disease Father        Pt doesn't know much about father's history but says he has either a PPM or ICD.    Social History Social History   Tobacco Use  . Smoking status: Current Every Day Smoker    Packs/day: 0.50    Years: 20.00    Pack years: 10.00  . Smokeless tobacco: Never Used  . Tobacco comment: currently smoking 0.5 - 1 ppd.  Substance Use Topics  . Alcohol use: No  . Drug use: No  Review of Systems  Constitutional: No fever/chills Musculoskeletal: Negative for musculoskeletal pain. Skin: Negative for rash, ecchymosis.  Positive for laceration. Neurological: Negative for numbness or tingling   ____________________________________________   PHYSICAL EXAM:  VITAL SIGNS: ED Triage Vitals  Enc Vitals Group     BP 11/01/18 1812 (!) 159/104     Pulse Rate 11/01/18 1812 86     Resp 11/01/18 1812 18     Temp 11/01/18 1812 98 F (36.7 C)     Temp Source 11/01/18 1812 Oral     SpO2 11/01/18 1812 100 %     Weight 11/01/18 1813 180 lb (81.6 kg)     Height 11/01/18 1813 6' (1.829 m)     Head Circumference --      Peak Flow --      Pain Score --      Pain Loc --       Pain Edu? --      Excl. in GC? --      Constitutional: Alert and oriented. Well appearing and in no acute distress. Eyes: Conjunctivae are normal. PERRL. EOMI. Head: Atraumatic. ENT:      Ears:      Nose: No congestion/rhinnorhea.      Mouth/Throat: Mucous membranes are moist.  Neck: No stridor.  Cardiovascular: Normal rate, regular rhythm.  Good peripheral circulation. Respiratory: Normal respiratory effort without tachypnea or retractions. Lungs CTAB. Good air entry to the bases with no decreased or absent breath sounds. Musculoskeletal: Full range of motion to all extremities. No gross deformities appreciated. Neurologic:  Normal speech and language. No gross focal neurologic deficits are appreciated.  Skin:  Skin is warm, dry.  2 cm laceration to distal left thumb with 5 sutures in place. Psychiatric: Mood and affect are normal. Speech and behavior are normal. Patient exhibits appropriate insight and judgement.   ____________________________________________   LABS (all labs ordered are listed, but only abnormal results are displayed)  Labs Reviewed - No data to display ____________________________________________  EKG   ____________________________________________  RADIOLOGY   No results found.  ____________________________________________    PROCEDURES  Procedure(s) performed:    Procedures  SUTURE REMOVAL Performed by: Enid Derry  Consent: Verbal consent obtained. Patient identity confirmed: provided demographic data Time out: Immediately prior to procedure a "time out" was called to verify the correct patient, procedure, equipment, support staff and site/side marked as required.  Location details: thumb  Wound Appearance: clean  Sutures/Staples Removed: 5  Facility: sutures placed in this facility Patient tolerance: Patient tolerated the procedure well with no immediate complications.      Medications - No data to  display   ____________________________________________   INITIAL IMPRESSION / ASSESSMENT AND PLAN / ED COURSE  Pertinent labs & imaging results that were available during my care of the patient were reviewed by me and considered in my medical decision making (see chart for details).  Review of the [place] CSRS was performed in accordance of the [facility] prior to dispensing any controlled drugs.     Patient presented to the emergency department for suture removal.  Vital signs and exam are reassuring.  Sutures were removed in the emergency department.  No signs of infection.   Patient is to follow up with primary care as directed. Patient is given ED precautions to return to the ED for any worsening or new symptoms.     ____________________________________________  FINAL CLINICAL IMPRESSION(S) / ED DIAGNOSES  Final diagnoses:  Visit for suture removal  NEW MEDICATIONS STARTED DURING THIS VISIT:  ED Discharge Orders    None          This chart was dictated using voice recognition software/Dragon. Despite best efforts to proofread, errors can occur which can change the meaning. Any change was purely unintentional.    Enid Derry, PA-C 11/01/18 1852    Willy Eddy, MD 11/06/18 [phone]

## 2018-11-01 NOTE — ED Triage Notes (Signed)
Pt to ED for suture removal on left thumb.

## 2018-12-10 ENCOUNTER — Other Ambulatory Visit: Payer: Self-pay

## 2018-12-10 ENCOUNTER — Encounter: Payer: Self-pay | Admitting: Emergency Medicine

## 2018-12-10 DIAGNOSIS — R112 Nausea with vomiting, unspecified: Secondary | ICD-10-CM | POA: Insufficient documentation

## 2018-12-10 DIAGNOSIS — Z79899 Other long term (current) drug therapy: Secondary | ICD-10-CM | POA: Diagnosis not present

## 2018-12-10 DIAGNOSIS — R509 Fever, unspecified: Secondary | ICD-10-CM | POA: Diagnosis present

## 2018-12-10 DIAGNOSIS — F17210 Nicotine dependence, cigarettes, uncomplicated: Secondary | ICD-10-CM | POA: Insufficient documentation

## 2018-12-10 DIAGNOSIS — I10 Essential (primary) hypertension: Secondary | ICD-10-CM | POA: Diagnosis not present

## 2018-12-10 DIAGNOSIS — I4891 Unspecified atrial fibrillation: Secondary | ICD-10-CM | POA: Diagnosis not present

## 2018-12-10 DIAGNOSIS — Z20828 Contact with and (suspected) exposure to other viral communicable diseases: Secondary | ICD-10-CM | POA: Diagnosis not present

## 2018-12-10 LAB — CBC WITH DIFFERENTIAL/PLATELET
Abs Immature Granulocytes: 0.03 10*3/uL (ref 0.00–0.07)
Basophils Absolute: 0.1 10*3/uL (ref 0.0–0.1)
Basophils Relative: 1 %
Eosinophils Absolute: 0.6 10*3/uL — ABNORMAL HIGH (ref 0.0–0.5)
Eosinophils Relative: 5 %
HCT: 49.2 % (ref 39.0–52.0)
Hemoglobin: 16.1 g/dL (ref 13.0–17.0)
Immature Granulocytes: 0 %
Lymphocytes Relative: 38 %
Lymphs Abs: 4.3 10*3/uL — ABNORMAL HIGH (ref 0.7–4.0)
MCH: 30.3 pg (ref 26.0–34.0)
MCHC: 32.7 g/dL (ref 30.0–36.0)
MCV: 92.7 fL (ref 80.0–100.0)
Monocytes Absolute: 0.8 10*3/uL (ref 0.1–1.0)
Monocytes Relative: 7 %
Neutro Abs: 5.4 10*3/uL (ref 1.7–7.7)
Neutrophils Relative %: 49 %
Platelets: 235 10*3/uL (ref 150–400)
RBC: 5.31 MIL/uL (ref 4.22–5.81)
RDW: 12.9 % (ref 11.5–15.5)
WBC: 11.1 10*3/uL — ABNORMAL HIGH (ref 4.0–10.5)
nRBC: 0 % (ref 0.0–0.2)

## 2018-12-10 NOTE — ED Triage Notes (Addendum)
Patient ambulatory to triage with steady gait, without difficulty or distress noted, mask in place; pt reports x 2-3 days having N/V and generalized abd pain; pt instructed on protocols and reasoning and st "what's that for, I've only vomited once"; explained to pt again importance on such

## 2018-12-11 ENCOUNTER — Emergency Department: Payer: Medicaid Other

## 2018-12-11 ENCOUNTER — Emergency Department
Admission: EM | Admit: 2018-12-11 | Discharge: 2018-12-11 | Disposition: A | Discharge status: Home / Self Care | Payer: Medicaid Other | Attending: Emergency Medicine | Admitting: Emergency Medicine

## 2018-12-11 DIAGNOSIS — R112 Nausea with vomiting, unspecified: Secondary | ICD-10-CM

## 2018-12-11 LAB — URINALYSIS, COMPLETE (UACMP) WITH MICROSCOPIC
Bacteria, UA: NONE SEEN
Bilirubin Urine: NEGATIVE
Glucose, UA: NEGATIVE mg/dL
Hgb urine dipstick: NEGATIVE
Ketones, ur: NEGATIVE mg/dL
Leukocytes,Ua: NEGATIVE
Nitrite: NEGATIVE
Protein, ur: NEGATIVE mg/dL
Specific Gravity, Urine: 1.023 (ref 1.005–1.030)
Squamous Epithelial / HPF: NONE SEEN (ref 0–5)
pH: 5 (ref 5.0–8.0)

## 2018-12-11 LAB — COMPREHENSIVE METABOLIC PANEL
ALT: 18 U/L (ref 0–44)
AST: 18 U/L (ref 15–41)
Albumin: 4.2 g/dL (ref 3.5–5.0)
Alkaline Phosphatase: 77 U/L (ref 38–126)
Anion gap: 10 (ref 5–15)
BUN: 16 mg/dL (ref 6–20)
CO2: 27 mmol/L (ref 22–32)
Calcium: 9.1 mg/dL (ref 8.9–10.3)
Chloride: 104 mmol/L (ref 98–111)
Creatinine, Ser: 0.84 mg/dL (ref 0.61–1.24)
GFR calc Af Amer: >60 mL/min (ref >60)
GFR calc non Af Amer: >60 mL/min (ref >60)
Glucose, Bld: 120 mg/dL — ABNORMAL HIGH (ref 70–99)
Potassium: 4 mmol/L (ref 3.5–5.1)
Sodium: 141 mmol/L (ref 135–145)
Total Bilirubin: 0.4 mg/dL (ref 0.3–1.2)
Total Protein: 7.6 g/dL (ref 6.5–8.1)

## 2018-12-11 LAB — SARS CORONAVIRUS 2 BY RT PCR (HOSPITAL ORDER, PERFORMED IN ~~LOC~~ HOSPITAL LAB): SARS Coronavirus 2: NEGATIVE

## 2018-12-11 MED ORDER — IOHEXOL 300 MG/ML  SOLN
100.0000 mL | Freq: Once | INTRAMUSCULAR | Status: AC | PRN
  Administered 2018-12-11: 100 mL via INTRAVENOUS

## 2018-12-11 MED ORDER — ONDANSETRON 4 MG PO TBDP: 4.0000 mg | ORAL_TABLET | Freq: Three times a day (TID) | ORAL | 0 refills | Status: DC | PRN

## 2018-12-11 MED ORDER — ACETAMINOPHEN 325 MG PO TABS
650.0000 mg | ORAL_TABLET | Freq: Once | ORAL | Status: AC
  Administered 2018-12-11: 650 mg via ORAL
  Filled 2018-12-11: qty 2

## 2018-12-11 NOTE — ED Provider Notes (Signed)
[facility] Emergency Department Provider Note   First MD Initiated Contact with Patient 12/11/18 0016     (approximate)  I have reviewed the triage vital signs and the nursing notes.   HISTORY  Chief Complaint Abdominal Pain    HPI Jeremy Yoder is a 38 y.o. male with medical history as listed below presents with 3-day history of chills, subjective fevers, nonproductive cough, nausea vomiting x2 episodes that were nonbloody and abdominal discomfort.  Known sick contact.        Past Medical History:  Diagnosis Date   A-fib [facility](HCC)    Hypertension    a. Noncompliant w/ meds.   Seizures (HCC)    a. Dx in teens. Noncompliant w/ meds.   Tobacco abuse     Patient Active Problem List   Diagnosis Date Noted   New onset atrial fibrillation (HCC) 01/03/2018   Essential hypertension 01/03/2018   Seizure disorder (HCC) 01/03/2018    Past Surgical History:  Procedure Laterality Date   arm     EYE SURGERY     FOREARM SURGERY     KNEE SURGERY      Prior to Admission medications   Medication Sig Start Date End Date Taking? Authorizing Provider  acetaminophen (TYLENOL) 500 MG tablet Take 1 tablet (500 mg total) by mouth every 6 (six) hours as needed. 07/17/18   Enid DerryWagner, Ashley, PA-C  amoxicillin (AMOXIL) 500 MG tablet Take 1 tablet (500 mg total) by mouth 3 (three) times daily. Patient not taking: Reported on 09/16/2018 03/06/18   Kem Boroughsriplett, Cari B, FNP  chlorhexidine (PERIDEX) 0.12 % solution Use as directed 15 mLs in the mouth or throat 2 (two) times daily. Patient not taking: Reported on 09/16/2018 03/06/18   Chinita Pesterriplett, Cari B, FNP  cyclobenzaprine (FLEXERIL) 5 MG tablet Take 1-2 tablets 3 times daily as needed 07/17/18   Enid DerryWagner, Ashley, PA-C  dicyclomine (BENTYL) 10 MG capsule Take 1 capsule (10 mg total) by mouth 3 (three) times daily as needed for up to 14 days for spasms. or abdominal pain 08/17/18 08/31/18  Loleta RoseForbach, Cory, MD  levETIRAcetam  (KEPPRA) 500 MG tablet Take 1 tablet (500 mg total) by mouth 2 (two) times daily. 03/20/18   Loleta RoseForbach, Cory, MD  metoprolol succinate (TOPROL-XL) 50 MG 24 hr tablet Take 1 tablet (50 mg total) by mouth daily. Take with or immediately following a meal. 03/20/18   Loleta RoseForbach, Cory, MD  ondansetron (ZOFRAN ODT) 4 MG disintegrating tablet Take 1 tablet (4 mg total) by mouth every 8 (eight) hours as needed. 12/11/18   Darci CurrentBrown, [place] N, MD  oxyCODONE-acetaminophen (PERCOCET/ROXICET) 5-325 MG tablet Take 1 tablet by mouth every 4 (four) hours as needed for severe pain. Patient not taking: Reported on 09/16/2018 05/15/18   Irean HongSung, Jade J, MD  rivaroxaban (XARELTO) 20 MG TABS tablet Take 1 tablet (20 mg total) by mouth daily. 01/03/18   Altamese DillingVachhani, Vaibhavkumar, MD  traMADol (ULTRAM) 50 MG tablet Take 1 tablet (50 mg total) by mouth every 6 (six) hours as needed. Patient not taking: Reported on 09/16/2018 03/06/18   Kem Boroughsriplett, Cari B, FNP    Allergies Aspirin and Ibuprofen  Family History  Problem Relation Age of Onset   Hypertension Mother    Diabetes Mother    Atrial fibrillation Mother    Heart disease Father        Pt doesn't know much about father's history but says he has either a PPM or ICD.    Social History Social  History   Tobacco Use   Smoking status: Current Every Day Smoker    Packs/day: 0.50    Years: 20.00    Pack years: 10.00   Smokeless tobacco: Never Used   Tobacco comment: currently smoking 0.5 - 1 ppd.  Substance Use Topics   Alcohol use: No   Drug use: No    Review of Systems Constitutional: Positive for fever/chills Eyes: No visual changes. ENT: Positive for sore throat. Cardiovascular: Denies chest pain. Respiratory: Denies shortness of breath.  Positive for cough Gastrointestinal: Positive for abdominal pain and 2 episodes of vomiting.  No diarrhea.  No constipation. Genitourinary: Negative for dysuria. Musculoskeletal: Negative for neck pain.  Negative for back  pain. Integumentary: Negative for rash. Neurological: Negative for headaches, focal weakness or numbness.   ____________________________________________   PHYSICAL EXAM:  VITAL SIGNS: ED Triage Vitals [12/10/18 2314]  Enc Vitals Group     BP (!) 191/100     Pulse Rate 88     Resp 18     Temp 97.6 F (36.4 C)     Temp Source Oral     SpO2 98 %     Weight 81.6 kg (180 lb)     Height 1.829 m (6')     Head Circumference      Peak Flow      Pain Score 6     Pain Loc      Pain Edu?      Excl. in GC?     Constitutional: Alert and oriented. Well appearing and in no acute distress. Eyes: Conjunctivae are normal. Mouth/Throat: Mucous membranes are moist. Oropharynx non-erythematous. Neck: No stridor.  Cardiovascular: Normal rate, regular rhythm. Good peripheral circulation. Grossly normal heart sounds. Respiratory: Normal respiratory effort.  No retractions. No audible wheezing. Gastrointestinal: Left upper quadrant/left lower quadrant tenderness to palpation.. No distention.  Musculoskeletal: No lower extremity tenderness nor edema. No gross deformities of extremities. Neurologic:  Normal speech and language. No gross focal neurologic deficits are appreciated.  Skin:  Skin is warm, dry and intact. No rash noted. Psychiatric: Mood and affect are normal. Speech and behavior are normal.  ____________________________________________   LABS (all labs ordered are listed, but only abnormal results are displayed)  Labs Reviewed  CBC WITH DIFFERENTIAL/PLATELET - Abnormal; Notable for the following components:      Result Value   WBC 11.1 (*)    Lymphs Abs 4.3 (*)    Eosinophils Absolute 0.6 (*)    All other components within normal limits  COMPREHENSIVE METABOLIC PANEL - Abnormal; Notable for the following components:   Glucose, Bld 120 (*)    All other components within normal limits  URINALYSIS, COMPLETE (UACMP) WITH MICROSCOPIC - Abnormal; Notable for the following  components:   Color, Urine YELLOW (*)    APPearance CLEAR (*)    All other components within normal limits  SARS CORONAVIRUS 2 (HOSPITAL ORDER, PERFORMED IN [facility])     RADIOLOGY I, Brier N Frandy Basnett, personally viewed and evaluated these images (plain radiographs) as part of my medical decision making, as well as reviewing the written report by the radiologist.  ED MD interpretation: No acute inflammatory process identified in the abdomen and pelvis on CT per radiologist.  Official radiology report(s): Ct Abdomen Pelvis W Contrast  Result Date: 12/11/2018 CLINICAL DATA:  38 year old male with chills body aches, abdominal pain and vomiting. EXAM: CT ABDOMEN AND PELVIS WITH CONTRAST TECHNIQUE: Multidetector CT imaging of the abdomen and pelvis was  performed using the standard protocol following bolus administration of intravenous contrast. CONTRAST:  OMNIPAQUE IOHEXOL 300 MG/ML  SOLN COMPARISON:  CT Abdomen and Pelvis 08/17/2018 and earlier. FINDINGS: Lower chest: Stable and negative visible lung bases. No pleural or pericardial effusion. Hepatobiliary: Borderline hepatic steatosis. Otherwise negative liver and gallbladder. Pancreas: Negative. Spleen: Negative. Adrenals/Urinary Tract: Normal adrenal glands. Both kidneys appear stable and normal. No nephrolithiasis. Normal ureters. Decompressed and unremarkable urinary bladder. Incidental pelvic phleboliths. Stomach/Bowel: Decompressed and negative descending, rectosigmoid colon. Negative transverse colon. Negative right colon and appendix (coronal image 47). Negative terminal ileum. No dilated small bowel. Possible small gastric hiatal hernia but otherwise negative stomach. No free air, free fluid. Vascular/Lymphatic: Soft and calcified Aortoiliac atherosclerosis. Major arterial structures are patent. Portal venous system is patent. No lymphadenopathy. Reproductive: Negative. Other: No pelvic free fluid. Musculoskeletal: No  acute osseous abnormality identified. IMPRESSION: No acute or inflammatory process identified in the abdomen or pelvis. Normal appendix. Age advanced Aortic Atherosclerosis (ICD10-I70.0). Electronically Signed   By: Odessa Fleming M.D.   On: 12/11/2018 02:28      Procedures   ____________________________________________   INITIAL IMPRESSION / MDM / ASSESSMENT AND PLAN / ED COURSE  As part of my medical decision making, I reviewed the following data within the electronic MEDICAL RECORD NUMBER   38 year old male presenting with above-stated history and physical exam with concern for viral illness including over 19.  Also considered possibility of intra-abdominal pathology given patient's abdominal pain and vomiting including diverticulitis appendicitis colitis gastroenteritis.  CT abdomen pelvis revealed no acute intra-abdominal pathology.  COVID-19 testing negative.  As such suspect gastroenteritis as etiology for the patient symptoms.  Patient given Zofran emergency department no further nausea or vomiting.  Patient denies any pain at present.  Jeremy Yoder was evaluated in Emergency Department on 12/11/2018 for the symptoms described in the history of present illness. He was evaluated in the context of the global COVID-19 pandemic, which necessitated consideration that the patient might be at risk for infection with the SARS-CoV-2 virus that causes COVID-19. Institutional protocols and algorithms that pertain to the evaluation of patients at risk for COVID-19 are in a state of rapid change based on information released by regulatory bodies including the CDC and federal and state organizations. These policies and algorithms were followed during the patient's care in the ED.   ____________________________________________  FINAL CLINICAL IMPRESSION(S) / ED DIAGNOSES  Final diagnoses:  Nausea and vomiting, intractability of vomiting not specified, unspecified vomiting type     MEDICATIONS GIVEN DURING  THIS VISIT:  Medications  iohexol (OMNIPAQUE) 300 MG/ML solution 100 mL (100 mLs Intravenous Contrast Given 12/11/18 0152)  acetaminophen (TYLENOL) tablet 650 mg (650 mg Oral Given 12/11/18 0233)     ED Discharge Orders         Ordered    ondansetron (ZOFRAN ODT) 4 MG disintegrating tablet  Every 8 hours PRN,   Status:  Discontinued     12/11/18 0313    ondansetron (ZOFRAN ODT) 4 MG disintegrating tablet  Every 8 hours PRN     12/11/18 0322           Note:  This document was prepared using Dragon voice recognition software and may include unintentional dictation errors.   Darci Current, MD 12/11/18 [phone]

## 2018-12-11 NOTE — ED Notes (Signed)
Pt c/o body aches, chills, abd and vomiting (once) for the past few days. Pt denies being around only who has been sick.

## 2018-12-26 ENCOUNTER — Other Ambulatory Visit: Payer: Self-pay

## 2018-12-26 ENCOUNTER — Telehealth: Payer: Self-pay | Admitting: Cardiovascular Disease

## 2018-12-26 ENCOUNTER — Telehealth (INDEPENDENT_AMBULATORY_CARE_PROVIDER_SITE_OTHER): Payer: Medicaid Other | Admitting: Cardiovascular Disease

## 2018-12-26 VITALS — Ht 72.0 in | Wt 180.0 lb

## 2018-12-26 DIAGNOSIS — I10 Essential (primary) hypertension: Secondary | ICD-10-CM | POA: Diagnosis not present

## 2018-12-26 DIAGNOSIS — G40909 Epilepsy, unspecified, not intractable, without status epilepticus: Secondary | ICD-10-CM | POA: Diagnosis not present

## 2018-12-26 DIAGNOSIS — R42 Dizziness and giddiness: Secondary | ICD-10-CM | POA: Insufficient documentation

## 2018-12-26 DIAGNOSIS — I480 Paroxysmal atrial fibrillation: Secondary | ICD-10-CM | POA: Diagnosis not present

## 2018-12-26 DIAGNOSIS — R0602 Shortness of breath: Secondary | ICD-10-CM

## 2018-12-26 MED ORDER — METOPROLOL SUCCINATE ER 50 MG PO TB24: 50.0000 mg | ORAL_TABLET | Freq: Every day | ORAL | 3 refills | Status: DC

## 2018-12-26 NOTE — Patient Instructions (Addendum)
Referral for Dr. Sherryll Burger over at [address] [facility]. I will send them your information and they should reach out to you for appointment scheduling. Location of his office is as follows; [facility] Dr. Sherryll Burger [address] [place], [place] [address] Phone number is [phone]   Medication Instructions:  Continue current medications. Refill sent in today.   If you need a refill on your cardiac medications before your next appointment, please call your pharmacy.    Lab work: No new labs needed   If you have labs (blood work) drawn today and your tests are completely normal, you will receive your results only by: Marland Kitchen MyChart Message (if you have MyChart) OR . A paper copy in the mail If you have any lab test that is abnormal or we need to change your treatment, we will call you to review the results.   Testing/Procedures: Your physician has requested that you have an echocardiogram. Echocardiography is a painless test that uses sound waves to create images of your heart. It provides your doctor with information about the size and shape of your heart and how well your heart's chambers and valves are working. This procedure takes approximately one hour. There are no restrictions for this procedure.   Follow-Up: At [facility], you and your health needs are our priority.  As part of our continuing mission to provide you with exceptional heart care, we have created designated Provider Care Teams.  These Care Teams include your primary Cardiologist (physician) and Advanced Practice Providers (APPs -  Physician Assistants and Nurse Practitioners) who all work together to provide you with the care you need, when you need it.  . You will need a follow up appointment as needed  . Providers on your designated Care Team:   . Nicolasa Ducking, NP . Eula Listen, PA-C . Marisue Ivan, PA-C  Any Other Special Instructions Will Be Listed Below (If Applicable).  For educational health  videos Log in to : [email or web address] Or : [email or web address], password : [email or web address]   How to Take Your Blood Pressure You can take your blood pressure at home with a machine. You may need to check your blood pressure at home:  To check if you have high blood pressure (hypertension).  To check your blood pressure over time.  To make sure your blood pressure medicine is working. Supplies needed: You will need a blood pressure machine, or monitor. You can buy one at a drugstore or online. When choosing one:  Choose one with an arm cuff.  Choose one that wraps around your upper arm. Only one finger should fit between your arm and the cuff.  Do not choose one that measures your blood pressure from your wrist or finger. Your doctor can suggest a monitor. How to prepare Avoid these things for 30 minutes before checking your blood pressure:  Drinking caffeine.  Drinking alcohol.  Eating.  Smoking.  Exercising. Five minutes before checking your blood pressure:  Pee.  Sit in a dining chair. Avoid sitting in a soft couch or armchair.  Be quiet. Do not talk. How to take your blood pressure Follow the instructions that came with your machine. If you have a digital blood pressure monitor, these may be the instructions: 1. Sit up straight. 2. Place your feet on the floor. Do not cross your ankles or legs. 3. Rest your left arm at the level of your heart. You may rest it on a table, desk, or chair. 4. Pull up your shirt sleeve.  5. Wrap the blood pressure cuff around the upper part of your left arm. The cuff should be 1 inch (2.5 cm) above your elbow. It is best to wrap the cuff around bare skin. 6. Fit the cuff snugly around your arm. You should be able to place only one finger between the cuff and your arm. 7. Put the cord inside the groove of your elbow. 8. Press the power button. 9. Sit quietly while the cuff fills with air and loses air. 10. Write down the numbers on the  screen. 11. Wait 2-3 minutes and then repeat steps 1-10. What do the numbers mean? Two numbers make up your blood pressure. The first number is called systolic pressure. The second is called diastolic pressure. An example of a blood pressure reading is "120 over 80" (or 120/80). If you are an adult and do not have a medical condition, use this guide to find out if your blood pressure is normal: Normal  First number: below 120.  Second number: below 80. Elevated  First number: 120-129.  Second number: below 80. Hypertension stage 1  First number: 130-139.  Second number: 80-89. Hypertension stage 2  First number: 140 or above.  Second number: 90 or above. Your blood pressure is above normal even if only the top or bottom number is above normal. Follow these instructions at home:  Check your blood pressure as often as your doctor tells you to.  Take your monitor to your next doctor's appointment. Your doctor will: ? Make sure you are using it correctly. ? Make sure it is working right.  Make sure you understand what your blood pressure numbers should be.  Tell your doctor if your medicines are causing side effects. Contact a doctor if:  Your blood pressure keeps being high. Get help right away if:  Your first blood pressure number is higher than 180.  Your second blood pressure number is higher than 120. This information is not intended to replace advice given to you by your health care provider. Make sure you discuss any questions you have with your health care provider. Document Released: 07/13/2008 Document Revised: 06/28/2016 Document Reviewed: 01/07/2016 Elsevier Interactive Patient Education  2019 [employer] Inc.  Blood Pressure Record Sheet To take your blood pressure, you will need a blood pressure machine. You can buy a blood pressure machine (blood pressure monitor) at your clinic, drug store, or online. When choosing one, consider:  An automatic monitor  that has an arm cuff.  A cuff that wraps snugly around your upper arm. You should be able to fit only one finger between your arm and the cuff.  A device that stores blood pressure reading results.  Do not choose a monitor that measures your blood pressure from your wrist or finger. Follow your health care provider's instructions for how to take your blood pressure. To use this form:  Get one reading in the morning (a.m.) before you take any medicines.  Get one reading in the evening (p.m.) before supper.  Take at least 2 readings with each blood pressure check. This makes sure the results are correct. Wait 1-2 minutes between measurements.  Write down the results in the spaces on this form.  Repeat this once a week, or as told by your health care provider.  Make a follow-up appointment with your health care provider to discuss the results. Blood pressure log Date: _______________________  a.m. _____________________(1st reading) _____________________(2nd reading)  p.m. _____________________(1st reading) _____________________(2nd reading) Date: _______________________  a.m.  _____________________(1st reading) _____________________(2nd reading)  p.m. _____________________(1st reading) _____________________(2nd reading) Date: _______________________  a.m. _____________________(1st reading) _____________________(2nd reading)  p.m. _____________________(1st reading) _____________________(2nd reading) Date: _______________________  a.m. _____________________(1st reading) _____________________(2nd reading)  p.m. _____________________(1st reading) _____________________(2nd reading) Date: _______________________  a.m. _____________________(1st reading) _____________________(2nd reading)  p.m. _____________________(1st reading) _____________________(2nd reading) This information is not intended to replace advice given to you by your health care provider. Make sure you discuss any  questions you have with your health care provider. Document Released: 04/29/2003 Document Revised: 07/31/2017 Document Reviewed: 07/31/2017 Elsevier Interactive Patient Education  2019 [employer]  Echocardiogram An echocardiogram is a procedure that uses painless sound waves (ultrasound) to produce an image of the heart. Images from an echocardiogram can provide important information about:  Signs of coronary artery disease (CAD).  Aneurysm detection. An aneurysm is a weak or damaged part of an artery wall that bulges out from the normal force of blood pumping through the body.  Heart size and shape. Changes in the size or shape of the heart can be associated with certain conditions, including heart failure, aneurysm, and CAD.  Heart muscle function.  Heart valve function.  Signs of a past heart attack.  Fluid buildup around the heart.  Thickening of the heart muscle.  A tumor or infectious growth around the heart valves. Tell a health care provider about:  Any allergies you have.  All medicines you are taking, including vitamins, herbs, eye drops, creams, and over-the-counter medicines.  Any blood disorders you have.  Any surgeries you have had.  Any medical conditions you have.  Whether you are pregnant or may be pregnant. What are the risks? Generally, this is a safe procedure. However, problems may occur, including:  Allergic reaction to dye (contrast) that may be used during the procedure. What happens before the procedure? No specific preparation is needed. You may eat and drink normally. What happens during the procedure?   An IV tube may be inserted into one of your veins.  You may receive contrast through this tube. A contrast is an injection that improves the quality of the pictures from your heart.  A gel will be applied to your chest.  A wand-like tool (transducer) will be moved over your chest. The gel will help to transmit the sound waves from the  transducer.  The sound waves will harmlessly bounce off of your heart to allow the heart images to be captured in real-time motion. The images will be recorded on a computer. The procedure may vary among health care providers and hospitals. What happens after the procedure?  You may return to your normal, everyday life, including diet, activities, and medicines, unless your health care provider tells you not to do that. Summary  An echocardiogram is a procedure that uses painless sound waves (ultrasound) to produce an image of the heart.  Images from an echocardiogram can provide important information about the size and shape of your heart, heart muscle function, heart valve function, and fluid buildup around your heart.  You do not need to do anything to prepare before this procedure. You may eat and drink normally.  After the echocardiogram is completed, you may return to your normal, everyday life, unless your health care provider tells you not to do that. This information is not intended to replace advice given to you by your health care provider. Make sure you discuss any questions you have with your health care provider. Document Released: 07/28/2000 Document Revised: 09/02/2016 Document Reviewed: 09/02/2016 Elsevier Interactive  Patient Education  2019 [employer].

## 2018-12-26 NOTE — Telephone Encounter (Signed)
Virtual Visit Pre-Appointment Phone Call  "(Name), I am calling you today to discuss your upcoming appointment. We are currently trying to limit exposure to the virus that causes COVID-19 by seeing patients at home rather than in the office."  1. "What is the BEST phone number to call the day of the visit?" - include this in appointment notes  2. Do you have or have access to (through a family member/friend) a smartphone with video capability that we can use for your visit?" a. If yes - list this number in appt notes as cell (if different from BEST phone #) and list the appointment type as a VIDEO visit in appointment notes b. If no - list the appointment type as a PHONE visit in appointment notes  3. Confirm consent - "In the setting of the current Covid19 crisis, you are scheduled for a (phone or video) visit with your provider on (date) at (time).  Just as we do with many in-office visits, in order for you to participate in this visit, we must obtain consent.  If you'd like, I can send this to your mychart (if signed up) or email for you to review.  Otherwise, I can obtain your verbal consent now.  All virtual visits are billed to your insurance company just like a normal visit would be.  By agreeing to a virtual visit, we'd like you to understand that the technology does not allow for your provider to perform an examination, and thus may limit your provider's ability to fully assess your condition. If your provider identifies any concerns that need to be evaluated in person, we will make arrangements to do so.  Finally, though the technology is pretty good, we cannot assure that it will always work on either your or our end, and in the setting of a video visit, we may have to convert it to a phone-only visit.  In either situation, we cannot ensure that we have a secure connection.  Are you willing to proceed?" STAFF: Did the patient verbally acknowledge consent to telehealth visit? Document  YES/NO here: YES   4. Advise patient to be prepared - "Two hours prior to your appointment, go ahead and check your blood pressure, pulse, oxygen saturation, and your weight (if you have the equipment to check those) and write them all down. When your visit starts, your provider will ask you for this information. If you have an Apple Watch or Kardia device, please plan to have heart rate information ready on the day of your appointment. Please have a pen and paper handy nearby the day of the visit as well."  5. Give patient instructions for MyChart download to smartphone OR Doximity/Doxy.me as below if video visit (depending on what platform provider is using)  6. Inform patient they will receive a phone call 15 minutes prior to their appointment time (may be from unknown caller ID) so they should be prepared to answer    TELEPHONE CALL NOTE  GRIER SOUDER has been deemed a candidate for a follow-up tele-health visit to limit community exposure during the Covid-19 pandemic. I spoke with the patient via phone to ensure availability of phone/video source, confirm preferred email & phone number, and discuss instructions and expectations.  I reminded Jeremy Yoder to be prepared with any vital sign and/or heart rhythm information that could potentially be obtained via home monitoring, at the time of his visit. I reminded Jeremy Yoder to expect a phone call prior  to his visit.  Norman Herrlichshley Gerringer 12/26/2018 11:44 AM   INSTRUCTIONS FOR DOWNLOADING THE MYCHART APP TO SMARTPHONE  - The patient must first make sure to have activated MyChart and know their login information - If Apple, go to [employer] Store and type in MyChart in the search bar and download the app. If Android, ask patient to go to [employer] Play Store and type in [facility] in the search bar and download the app. The app is free but as with any other app downloads, their phone may require them to verify saved payment information or Apple/Android  password.  - The patient will need to then log into the app with their MyChart username and password, and select [place] as their healthcare provider to link the account. When it is time for your visit, go to the MyChart app, find appointments, and click Begin Video Visit. Be sure to Select Allow for your device to access the Microphone and Camera for your visit. You will then be connected, and your provider will be with you shortly.  **If they have any issues connecting, or need assistance please contact MyChart service desk [phone])**  **If using a computer, in order to ensure the best quality for their visit they will need to use either of the following Internet Browsers: [employer] Edge, or Google Chrome**  IF USING DOXIMITY or DOXY.ME - The patient will receive a link just prior to their visit by text.     FULL LENGTH CONSENT FOR TELE-HEALTH VISIT   I hereby voluntarily request, consent and authorize [facility] and its employed or contracted physicians, physician assistants, nurse practitioners or other licensed health care professionals (the Practitioner), to provide me with telemedicine health care services (the Services") as deemed necessary by the treating Practitioner. I acknowledge and consent to receive the Services by the Practitioner via telemedicine. I understand that the telemedicine visit will involve communicating with the Practitioner through live audiovisual communication technology and the disclosure of certain medical information by electronic transmission. I acknowledge that I have been given the opportunity to request an in-person assessment or other available alternative prior to the telemedicine visit and am voluntarily participating in the telemedicine visit.  I understand that I have the right to withhold or withdraw my consent to the use of telemedicine in the course of my care at any time, without affecting my right to future care or treatment,  and that the Practitioner or I may terminate the telemedicine visit at any time. I understand that I have the right to inspect all information obtained and/or recorded in the course of the telemedicine visit and may receive copies of available information for a reasonable fee.  I understand that some of the potential risks of receiving the Services via telemedicine include:   Delay or interruption in medical evaluation due to technological equipment failure or disruption;  Information transmitted may not be sufficient (e.g. poor resolution of images) to allow for appropriate medical decision making by the Practitioner; and/or   In rare instances, security protocols could fail, causing a breach of personal health information.  Furthermore, I acknowledge that it is my responsibility to provide information about my medical history, conditions and care that is complete and accurate to the best of my ability. I acknowledge that Practitioner's advice, recommendations, and/or decision may be based on factors not within their control, such as incomplete or inaccurate data provided by me or distortions of diagnostic images or specimens that may result from electronic transmissions. I  understand that the practice of medicine is not an exact science and that Practitioner makes no warranties or guarantees regarding treatment outcomes. I acknowledge that I will receive a copy of this consent concurrently upon execution via email to the email address I last provided but may also request a printed copy by calling the office of [place].    I understand that my insurance will be billed for this visit.   I have read or had this consent read to me.  I understand the contents of this consent, which adequately explains the benefits and risks of the Services being provided via telemedicine.   I have been provided ample opportunity to ask questions regarding this consent and the Services and have had my questions  answered to my satisfaction.  I give my informed consent for the services to be provided through the use of telemedicine in my medical care  By participating in this telemedicine visit I agree to the above.

## 2018-12-26 NOTE — Progress Notes (Signed)
Virtual Visit via Video Note   This visit type was conducted due to national recommendations for restrictions regarding the COVID-19 Pandemic (e.g. social distancing) in an effort to limit this patient's exposure and mitigate transmission in our community.  Due to his co-morbid illnesses, this patient is at least at moderate risk for complications without adequate follow up.  This format is felt to be most appropriate for this patient at this time.  All issues noted in this document were discussed and addressed.  A limited physical exam was performed with this format.  Please refer to the patient's chart for his consent to telehealth for Jeremy Yoder.   I connected with  Jeremy Yoder on 12/26/18 by a video enabled telemedicine application and verified that I am speaking with the correct person using two identifiers. I discussed the limitations of evaluation and management by telemedicine. The patient expressed understanding and agreed to proceed.   Evaluation Performed:  Follow-up visit  Date:  12/26/2018   ID:  Jeremy Yoder, DOB 08/03/1981, MRN [id number]  Patient Location:  Jeremy Yoder   Provider location:   Jeremy Yoder, [place] office  [facility]  [facility]  Cardiologist:  Jeremy Yoder, [facility]   Chief Complaint: Dizziness and shortness of breath    History of Present Illness:    Jeremy Yoder is a 38 y.o. male who presents via audio/video conferencing for a telehealth visit today.   The patient does not symptoms concerning for COVID-19 infection (fever, chills, cough, or new SHORTNESS OF BREATH).   Patient has a past medical history of History of smoking and prior seizure disorder,  Noncompliant with his medications Presenting after several episodes of nausea and vomiting after barbecue Syncope /Seizure at home, wife encouraged him to present to the emergency room He presents today After recent discharge from the  hospital, noted to have atrial fibrillation with RVR  In general reports he has been doing well, denies any tachycardia or palpitations concerning for atrial fibrillation Reports that he gets his medications from " a doctor in the hospital" Hospital system shows he is now on disability, covered by [facility]  Recent emergency room evaluations for abdominal discomfort nausea vomiting  EKGs reviewed, no further episodes of atrial fibrillation documented  On metoprolol and keppra,   When in the drugstore reports having some high blood pressure up to 160 systolic Does not have any blood pressure measurements from home, does not have his own blood pressure cuff Reports he is dizzy sometimes at home when he stands up  Some shortness of breath on exertion, unclear etiology Feet hurt, Denies any lower extremity edema abdominal bloating  Other past medical history reviewed Presented to the hospital 01/03/2018 Reported symptoms of headache, abdominal pain generalized,emesis "Larey SeatFell out of bed woke up on the ground" Reported he was at the [place] day in the heat Wife witnessed him become unresponsive for 30 seconds after try to get out of bed, fell back into the bed  In the emergency room noted to be in atrial fibrillation with RVR,  Elevated WBC 16.9 Head CT negative Initially started on carvedilol Lovenox  Did not want to stay for TEE and cardioversion Had to get home to take care of 4 children, wife is working Express ScriptsConverted on his own to normal sinus rhythm   Prior CV studies:   The following studies were reviewed today:    Past Medical History:  Diagnosis Date  . A-fib (HCC)   . Hypertension    a. Noncompliant w/ meds.  . Seizures (HCC)    a. Dx in teens. Noncompliant w/ meds.  . Tobacco abuse    Past Surgical History:  Procedure Laterality Date  . arm    . EYE SURGERY    . FOREARM SURGERY    . KNEE SURGERY       Current Meds  Medication Sig  . acetaminophen  (TYLENOL) 500 MG tablet Take 1 tablet (500 mg total) by mouth every 6 (six) hours as needed.  . cyclobenzaprine (FLEXERIL) 5 MG tablet Take 1-2 tablets 3 times daily as needed  . levETIRAcetam (KEPPRA) 500 MG tablet Take 1 tablet (500 mg total) by mouth 2 (two) times daily.  . metoprolol succinate (TOPROL-XL) 50 MG 24 hr tablet Take 1 tablet (50 mg total) by mouth daily. Take with or immediately following a meal.  . oxyCODONE-acetaminophen (PERCOCET/ROXICET) 5-325 MG tablet Take 1 tablet by mouth every 4 (four) hours as needed for severe pain.     Allergies:   Aspirin and Ibuprofen   Social History   Tobacco Use  . Smoking status: Current Every Day Smoker    Packs/day: 0.50    Years: 20.00    Pack years: 10.00  . Smokeless tobacco: Never Used  . Tobacco comment: currently smoking 0.5 - 1 ppd.  Substance Use Topics  . Alcohol use: No  . Drug use: No     Current Outpatient Medications on File Prior to Visit  Medication Sig Dispense Refill  . acetaminophen (TYLENOL) 500 MG tablet Take 1 tablet (500 mg total) by mouth every 6 (six) hours as needed. 30 tablet 0  . cyclobenzaprine (FLEXERIL) 5 MG tablet Take 1-2 tablets 3 times daily as needed 20 tablet 0  . levETIRAcetam (KEPPRA) 500 MG tablet Take 1 tablet (500 mg total) by mouth 2 (two) times daily. 60 tablet 11  . metoprolol succinate (TOPROL-XL) 50 MG 24 hr tablet Take 1 tablet (50 mg total) by mouth daily. Take with or immediately following a meal. 30 tablet 11  . oxyCODONE-acetaminophen (PERCOCET/ROXICET) 5-325 MG tablet Take 1 tablet by mouth every 4 (four) hours as needed for severe pain. 15 tablet 0  . dicyclomine (BENTYL) 10 MG capsule Take 1 capsule (10 mg total) by mouth 3 (three) times daily as needed for up to 14 days for spasms. or abdominal pain 30 capsule 0   No current facility-administered medications on file prior to visit.      Family Hx: The patient's family history includes Atrial fibrillation in his mother;  Diabetes in his mother; Heart disease in his father; Hypertension in his mother.  ROS:   Please see the history of present illness.    Review of Systems  Constitutional: Negative.   Respiratory: Positive for shortness of breath.   Cardiovascular: Negative.   Gastrointestinal: Negative.   Musculoskeletal: Negative.   Neurological: Positive for dizziness.  Psychiatric/Behavioral: Negative.   All other systems reviewed and are negative.     Labs/Other Tests and Data Reviewed:    Recent Labs: 01/02/2018: Magnesium 2.2 01/03/2018: TSH 2.510 12/10/2018: ALT 18; BUN 16; Creatinine, Ser 0.84; Hemoglobin 16.1; Platelets 235; Potassium 4.0; Sodium 141   Recent Lipid Panel No results found for: CHOL, TRIG, HDL, CHOLHDL, LDLCALC, LDLDIRECT  Wt Readings from Last 3 Encounters:  12/26/18 180 lb (81.6 kg)  12/10/18 180 lb (81.6 kg)  11/01/18 180 lb (81.6 kg)  Exam:    Vital Signs: Vital signs may also be detailed in the HPI Ht 6' (1.829 m)   Wt 180 lb (81.6 kg)   BMI 24.41 kg/m   Wt Readings from Last 3 Encounters:  12/26/18 180 lb (81.6 kg)  12/10/18 180 lb (81.6 kg)  11/01/18 180 lb (81.6 kg)   Temp Readings from Last 3 Encounters:  12/10/18 97.6 F (36.4 C) (Oral)  11/01/18 98 F (36.7 C) (Oral)  10/22/18 97.8 F (36.6 C) (Oral)   BP Readings from Last 3 Encounters:  12/11/18 (!) 119/91  11/01/18 (!) 159/104  10/23/18 (!) 167/112   Pulse Readings from Last 3 Encounters:  12/11/18 96  11/01/18 86  10/23/18 70    Reports blood pressure 160/100 pulse rate 90 respiration 16 Was done in a pharmacy  Well nourished, well developed male in no acute distress. Constitutional:  oriented to person, place, and time. No distress.  Head: Normocephalic and atraumatic.  Eyes:  no discharge. No scleral icterus.  Neck: Normal range of motion. Neck supple.  Pulmonary/Chest: No audible wheezing, no distress, appears comfortable Musculoskeletal: Normal range of motion.  no   tenderness or deformity.  Neurological:   Coordination normal. Full exam not performed Skin:  No rash Psychiatric:  normal mood and affect. behavior is normal. Thought content normal.    ASSESSMENT & PLAN:    Paroxysmal atrial fibrillation (HCC) Lone episode of atrial fibrillation in the setting of seizure No further episodes since May 2019 Continue metoprolol  Essential hypertension He reports elevated blood pressure when checked in the pharmacy but does not have any numbers from home He is going to buy blood pressure cuff, check his blood pressures and will call us back with numbers Unclear if elevated blood pressure contributing to mild shortness of breath on exertion  Seizure disorder (HCC) Referred him to neurology Reports having side effects on Keppra, some bowel movement issues  Dizziness  Etiology unclear, he will check orthostatic numbers  HTN Reports elevated blood pressure Does not want to start a new medication at this time Recommended he that he call us with blood pressure numbers Consider buying blood pressure cuff for home checks   COVID-19 Education: The signs and symptoms of COVID-19 were discussed with the patient and how to seek care for testing (follow up with PCP or arrange E-visit).  The importance of social distancing was discussed today.  Patient Risk:   After full review of this patients clinical status, I feel that they are at least moderate risk at this time.  Time:   Today, I have spent 25 minutes with the patient with telehealth technology discussing the cardiac and medical problems/diagnoses detailed above   10 min spent reviewing the chart prior to patient visit today   Medication Adjustments/Labs and Tests Ordered: Current medicines are reviewed at length with the patient today.  Concerns regarding medicines are outlined above.   Tests Ordered: No tests ordered   Medication Changes: No changes made   Disposition: Follow-up as  needed   Signed, Julien Nordmann, MD  12/26/2018 3:39 PM    [facility] [address], [place], [place] [address]

## 2018-12-26 NOTE — Telephone Encounter (Signed)
Spoke with patient and reviewed that I did place referral for [facility] and provided him with their number. He also wanted to know when he would have his echocardiogram. Advised that our office would call him to schedule this at a later date given some restrictions due to COVID. He verbalized understanding was appreciative for the call with no further questions at this time.

## 2019-01-07 ENCOUNTER — Other Ambulatory Visit: Payer: Self-pay

## 2019-01-07 ENCOUNTER — Emergency Department
Admission: EM | Admit: 2019-01-07 | Discharge: 2019-01-07 | Disposition: A | Discharge status: Home / Self Care | Payer: Medicaid Other | Attending: Emergency Medicine | Admitting: Emergency Medicine

## 2019-01-07 ENCOUNTER — Encounter: Payer: Self-pay | Admitting: Emergency Medicine

## 2019-01-07 DIAGNOSIS — Y92410 Unspecified street and highway as the place of occurrence of the external cause: Secondary | ICD-10-CM | POA: Diagnosis not present

## 2019-01-07 DIAGNOSIS — Z79899 Other long term (current) drug therapy: Secondary | ICD-10-CM | POA: Insufficient documentation

## 2019-01-07 DIAGNOSIS — F17200 Nicotine dependence, unspecified, uncomplicated: Secondary | ICD-10-CM | POA: Insufficient documentation

## 2019-01-07 DIAGNOSIS — Y939 Activity, unspecified: Secondary | ICD-10-CM | POA: Insufficient documentation

## 2019-01-07 DIAGNOSIS — S161XXA Strain of muscle, fascia and tendon at neck level, initial encounter: Secondary | ICD-10-CM

## 2019-01-07 DIAGNOSIS — I10 Essential (primary) hypertension: Secondary | ICD-10-CM | POA: Diagnosis not present

## 2019-01-07 DIAGNOSIS — Y999 Unspecified external cause status: Secondary | ICD-10-CM | POA: Diagnosis not present

## 2019-01-07 DIAGNOSIS — I480 Paroxysmal atrial fibrillation: Secondary | ICD-10-CM | POA: Diagnosis not present

## 2019-01-07 DIAGNOSIS — S39012A Strain of muscle, fascia and tendon of lower back, initial encounter: Secondary | ICD-10-CM

## 2019-01-07 DIAGNOSIS — S1980XA Other specified injuries of unspecified part of neck, initial encounter: Secondary | ICD-10-CM | POA: Diagnosis present

## 2019-01-07 DIAGNOSIS — G40909 Epilepsy, unspecified, not intractable, without status epilepticus: Secondary | ICD-10-CM | POA: Insufficient documentation

## 2019-01-07 MED ORDER — PREDNISONE 50 MG PO TABS: 50.0000 mg | ORAL_TABLET | Freq: Every day | ORAL | 0 refills | Status: DC

## 2019-01-07 MED ORDER — METHOCARBAMOL 500 MG PO TABS: 500.0000 mg | ORAL_TABLET | Freq: Four times a day (QID) | ORAL | 0 refills | Status: DC
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
## 2019-01-22 ENCOUNTER — Encounter: Payer: Self-pay | Admitting: Emergency Medicine

## 2019-01-22 ENCOUNTER — Other Ambulatory Visit: Payer: Self-pay

## 2019-01-22 DIAGNOSIS — M25562 Pain in left knee: Secondary | ICD-10-CM | POA: Insufficient documentation

## 2019-01-22 DIAGNOSIS — I4891 Unspecified atrial fibrillation: Secondary | ICD-10-CM | POA: Diagnosis not present

## 2019-01-22 DIAGNOSIS — M545 Low back pain: Secondary | ICD-10-CM | POA: Diagnosis not present

## 2019-01-22 DIAGNOSIS — F17200 Nicotine dependence, unspecified, uncomplicated: Secondary | ICD-10-CM | POA: Diagnosis not present

## 2019-01-22 DIAGNOSIS — M5489 Other dorsalgia: Secondary | ICD-10-CM | POA: Diagnosis present
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
## 2019-01-23 ENCOUNTER — Emergency Department: Payer: Medicaid Other

## 2019-01-23 ENCOUNTER — Emergency Department
Admission: EM | Admit: 2019-01-23 | Discharge: 2019-01-23 | Disposition: A | Discharge status: Home / Self Care | Payer: Medicaid Other | Attending: Emergency Medicine | Admitting: Emergency Medicine

## 2019-01-23 DIAGNOSIS — M25562 Pain in left knee: Secondary | ICD-10-CM

## 2019-01-23 DIAGNOSIS — M545 Low back pain, unspecified: Secondary | ICD-10-CM

## 2019-01-23 MED ORDER — DIAZEPAM 5 MG PO TABS: 5.0000 mg | ORAL_TABLET | Freq: Three times a day (TID) | ORAL | 0 refills | Status: DC | PRN

## 2019-01-23 MED ORDER — LIDOCAINE 5 % EX PTCH: 1.0000 | MEDICATED_PATCH | CUTANEOUS | 0 refills | Status: DC

## 2019-01-23 MED ORDER — LIDOCAINE 5 % EX PTCH
MEDICATED_PATCH | CUTANEOUS | Status: AC
  Filled 2019-01-23: qty 1

## 2019-01-23 MED ORDER — DIAZEPAM 5 MG PO TABS
ORAL_TABLET | ORAL | Status: AC
  Filled 2019-01-23: qty 1

## 2019-01-23 NOTE — Discharge Instructions (Addendum)
1.  You may take Valium as needed for pain and muscle spasms. 2.  Apply lidocaine patch to affected area as directed for pain. 3.  Return to the ER for worsening symptoms, persistent vomiting, difficulty breathing or other concerns.

## 2019-02-03 ENCOUNTER — Other Ambulatory Visit: Payer: Self-pay | Admitting: Neurology

## 2019-02-03 DIAGNOSIS — R569 Unspecified convulsions: Secondary | ICD-10-CM

## 2019-02-20 ENCOUNTER — Ambulatory Visit: Payer: Medicaid Other

## 2019-03-01 ENCOUNTER — Encounter: Payer: Self-pay | Admitting: Emergency Medicine

## 2019-03-01 ENCOUNTER — Emergency Department: Payer: Medicaid Other

## 2019-03-01 ENCOUNTER — Emergency Department
Admission: EM | Admit: 2019-03-01 | Discharge: 2019-03-01 | Disposition: A | Discharge status: Home / Self Care | Payer: Medicaid Other | Attending: Emergency Medicine | Admitting: Emergency Medicine

## 2019-03-01 ENCOUNTER — Other Ambulatory Visit: Payer: Self-pay

## 2019-03-01 DIAGNOSIS — K449 Diaphragmatic hernia without obstruction or gangrene: Secondary | ICD-10-CM | POA: Insufficient documentation

## 2019-03-01 DIAGNOSIS — I10 Essential (primary) hypertension: Secondary | ICD-10-CM | POA: Diagnosis not present

## 2019-03-01 DIAGNOSIS — F17210 Nicotine dependence, cigarettes, uncomplicated: Secondary | ICD-10-CM | POA: Insufficient documentation

## 2019-03-01 DIAGNOSIS — Z7952 Long term (current) use of systemic steroids: Secondary | ICD-10-CM | POA: Insufficient documentation

## 2019-03-01 DIAGNOSIS — I480 Paroxysmal atrial fibrillation: Secondary | ICD-10-CM | POA: Diagnosis not present

## 2019-03-01 DIAGNOSIS — Z79899 Other long term (current) drug therapy: Secondary | ICD-10-CM | POA: Insufficient documentation

## 2019-03-01 DIAGNOSIS — R1010 Upper abdominal pain, unspecified: Secondary | ICD-10-CM | POA: Insufficient documentation

## 2019-03-01 DIAGNOSIS — R55 Syncope and collapse: Secondary | ICD-10-CM

## 2019-03-01 LAB — COMPREHENSIVE METABOLIC PANEL
ALT: 19 U/L (ref 0–44)
AST: 20 U/L (ref 15–41)
Albumin: 4.2 g/dL (ref 3.5–5.0)
Alkaline Phosphatase: 72 U/L (ref 38–126)
Anion gap: 8 (ref 5–15)
BUN: 14 mg/dL (ref 6–20)
CO2: 28 mmol/L (ref 22–32)
Calcium: 8.9 mg/dL (ref 8.9–10.3)
Chloride: 105 mmol/L (ref 98–111)
Creatinine, Ser: 1.05 mg/dL (ref 0.61–1.24)
GFR calc Af Amer: >60 mL/min (ref >60)
GFR calc non Af Amer: >60 mL/min (ref >60)
Glucose, Bld: 89 mg/dL (ref 70–99)
Potassium: 3.6 mmol/L (ref 3.5–5.1)
Sodium: 141 mmol/L (ref 135–145)
Total Bilirubin: 0.3 mg/dL (ref 0.3–1.2)
Total Protein: 7.8 g/dL (ref 6.5–8.1)

## 2019-03-01 LAB — CBC WITH DIFFERENTIAL/PLATELET
Abs Immature Granulocytes: 0.04 10*3/uL (ref 0.00–0.07)
Basophils Absolute: 0.1 10*3/uL (ref 0.0–0.1)
Basophils Relative: 1 %
Eosinophils Absolute: 0.5 10*3/uL (ref 0.0–0.5)
Eosinophils Relative: 4 %
HCT: 47.7 % (ref 39.0–52.0)
Hemoglobin: 15.9 g/dL (ref 13.0–17.0)
Immature Granulocytes: 0 %
Lymphocytes Relative: 35 %
Lymphs Abs: 3.8 10*3/uL (ref 0.7–4.0)
MCH: 30.2 pg (ref 26.0–34.0)
MCHC: 33.3 g/dL (ref 30.0–36.0)
MCV: 90.7 fL (ref 80.0–100.0)
Monocytes Absolute: 1.1 10*3/uL — ABNORMAL HIGH (ref 0.1–1.0)
Monocytes Relative: 10 %
Neutro Abs: 5.5 10*3/uL (ref 1.7–7.7)
Neutrophils Relative %: 50 %
Platelets: 197 10*3/uL (ref 150–400)
RBC: 5.26 MIL/uL (ref 4.22–5.81)
RDW: 12.8 % (ref 11.5–15.5)
WBC: 11 10*3/uL — ABNORMAL HIGH (ref 4.0–10.5)
nRBC: 0 % (ref 0.0–0.2)

## 2019-03-01 LAB — URINE DRUG SCREEN, QUALITATIVE (ARMC ONLY)
Amphetamines, Ur Screen: NOT DETECTED
Barbiturates, Ur Screen: NOT DETECTED
Benzodiazepine, Ur Scrn: NOT DETECTED
Cannabinoid 50 Ng, Ur ~~LOC~~: NOT DETECTED
Cocaine Metabolite,Ur ~~LOC~~: NOT DETECTED
MDMA (Ecstasy)Ur Screen: NOT DETECTED
Methadone Scn, Ur: NOT DETECTED
Opiate, Ur Screen: NOT DETECTED
Phencyclidine (PCP) Ur S: NOT DETECTED
Tricyclic, Ur Screen: NOT DETECTED

## 2019-03-01 LAB — ACETAMINOPHEN LEVEL: Acetaminophen (Tylenol), Serum: <10 ug/mL — ABNORMAL LOW (ref 10–30)

## 2019-03-01 LAB — TROPONIN I (HIGH SENSITIVITY): Troponin I (High Sensitivity): 4 ng/L (ref <18)

## 2019-03-01 LAB — SALICYLATE LEVEL: Salicylate Lvl: <7 mg/dL (ref 2.8–30.0)

## 2019-03-01 LAB — ETHANOL: Alcohol, Ethyl (B): <10 mg/dL (ref <10)

## 2019-03-01 LAB — LIPASE, BLOOD: Lipase: 42 U/L (ref 11–51)

## 2019-03-01 MED ORDER — ONDANSETRON HCL 4 MG/2ML IJ SOLN
4.0000 mg | Freq: Once | INTRAMUSCULAR | Status: AC
  Administered 2019-03-01: 4 mg via INTRAVENOUS
  Filled 2019-03-01: qty 2

## 2019-03-01 MED ORDER — SODIUM CHLORIDE 0.9 % IV BOLUS
1000.0000 mL | Freq: Once | INTRAVENOUS | Status: AC
  Administered 2019-03-01: 02:00:00 1000 mL via INTRAVENOUS

## 2019-03-01 MED ORDER — CLONIDINE HCL 0.1 MG PO TABS
0.1000 mg | ORAL_TABLET | Freq: Once | ORAL | Status: AC
  Administered 2019-03-01: 0.1 mg via ORAL
  Filled 2019-03-01: qty 1

## 2019-03-01 MED ORDER — FENTANYL CITRATE (PF) 100 MCG/2ML IJ SOLN
50.0000 ug | Freq: Once | INTRAMUSCULAR | Status: AC
  Administered 2019-03-01: 50 ug via INTRAVENOUS
  Filled 2019-03-01: qty 2

## 2019-03-01 MED ORDER — IOPAMIDOL (ISOVUE-370) INJECTION 76%
100.0000 mL | Freq: Once | INTRAVENOUS | Status: AC | PRN
  Administered 2019-03-01: 03:00:00 100 mL via INTRAVENOUS
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
## 2019-03-01 NOTE — ED Notes (Signed)
Unsuccessful IV attempt x 2 by this RN.  

## 2019-03-01 NOTE — ED Triage Notes (Signed)
Pt arrives via [facility] with possible seizure. Pt states that he is unsure of what happened but does have hx of seizures. Pt reports pain in his upper abdomen. Pt is alert and oriented and in NAD.

## 2019-03-01 NOTE — ED Notes (Signed)
Pt to CT at this time.

## 2019-03-01 NOTE — Discharge Instructions (Addendum)
Make sure you take your blood pressure medicines daily.  Return to the ER for worsening symptoms, persistent vomiting, difficulty breathing or other concerns.

## 2019-03-03 ENCOUNTER — Telehealth: Payer: Self-pay | Admitting: Cardiovascular Disease

## 2019-03-03 NOTE — Telephone Encounter (Signed)

## 2019-03-04 ENCOUNTER — Ambulatory Visit (INDEPENDENT_AMBULATORY_CARE_PROVIDER_SITE_OTHER): Payer: Medicaid Other

## 2019-03-04 ENCOUNTER — Other Ambulatory Visit: Payer: Self-pay

## 2019-03-04 DIAGNOSIS — R0602 Shortness of breath: Secondary | ICD-10-CM

## 2019-03-04 DIAGNOSIS — I480 Paroxysmal atrial fibrillation: Secondary | ICD-10-CM | POA: Diagnosis not present

## 2019-03-06 ENCOUNTER — Telehealth: Payer: Self-pay | Admitting: *Deleted

## 2019-03-06 NOTE — Telephone Encounter (Signed)
-----   Message from Minna Merritts, MD sent at 03/06/2019  3:00 PM EDT ----- Echocardiogram Good ejection fraction 50 to 55% No clear reason for shortness of breath based off his echocardiogram Would consider starting regular walking or exercise program

## 2019-03-06 NOTE — Telephone Encounter (Signed)
No answer. Left message to call back.   

## 2019-03-07 ENCOUNTER — Other Ambulatory Visit: Payer: Self-pay

## 2019-03-07 ENCOUNTER — Ambulatory Visit
Admission: RE | Admit: 2019-03-07 | Discharge: 2019-03-07 | Disposition: A | Discharge status: Home / Self Care | Payer: Medicaid Other | Source: Ambulatory Visit | Attending: Neurology | Admitting: Neurology

## 2019-03-07 DIAGNOSIS — R569 Unspecified convulsions: Secondary | ICD-10-CM | POA: Diagnosis present

## 2019-03-07 MED ORDER — GADOBUTROL 1 MMOL/ML IV SOLN
8.0000 mL | Freq: Once | INTRAVENOUS | Status: AC | PRN
  Administered 2019-03-07: 8 mL via INTRAVENOUS

## 2019-03-07 NOTE — Telephone Encounter (Signed)
Patient calling back to go over test results. Please call back

## 2019-03-07 NOTE — Telephone Encounter (Signed)
No answer. Left message to call back.   

## 2019-03-07 NOTE — Telephone Encounter (Signed)
Patient called back and he verbalized understanding or results and plan of care.

## 2019-03-14 ENCOUNTER — Other Ambulatory Visit: Payer: Self-pay

## 2019-03-14 ENCOUNTER — Emergency Department
Admission: EM | Admit: 2019-03-14 | Discharge: 2019-03-14 | Disposition: A | Discharge status: Home / Self Care | Payer: Medicaid Other | Attending: Emergency Medicine | Admitting: Emergency Medicine

## 2019-03-14 DIAGNOSIS — I10 Essential (primary) hypertension: Secondary | ICD-10-CM | POA: Insufficient documentation

## 2019-03-14 DIAGNOSIS — H5711 Ocular pain, right eye: Secondary | ICD-10-CM | POA: Diagnosis present

## 2019-03-14 DIAGNOSIS — F17210 Nicotine dependence, cigarettes, uncomplicated: Secondary | ICD-10-CM | POA: Insufficient documentation

## 2019-03-14 DIAGNOSIS — Z79899 Other long term (current) drug therapy: Secondary | ICD-10-CM | POA: Diagnosis not present

## 2019-03-14 DIAGNOSIS — H1031 Unspecified acute conjunctivitis, right eye: Secondary | ICD-10-CM | POA: Diagnosis not present

## 2019-03-14 MED ORDER — ERYTHROMYCIN 5 MG/GM OP OINT: 1.0000 "application " | TOPICAL_OINTMENT | Freq: Every day | OPHTHALMIC | 0 refills | Status: AC

## 2019-03-14 NOTE — ED Provider Notes (Signed)
[facility] Emergency Department Provider Note  ____________________________________________  Time seen: Approximately 10:29 PM  I have reviewed the triage vital signs and the nursing notes.   HISTORY  Chief Complaint Eye Problem    HPI Jeremy Yoder is a 38 y.o. male presents to the emergency department with an itchy, red, watering right eye for the past 2 to 3 days.  Patient states that he has been occasionally sensitive to light.  He denies foreign body exposures.  Denies recent swimming.  Patient states that he has intermittent blurry vision when he has increased tearing.  No nausea or vomiting.  No similar issues in the past.        Past Medical History:  Diagnosis Date  . A-fib (HCC)   . Hypertension    a. Noncompliant w/ meds.  . Seizures (HCC)    a. Dx in teens. Noncompliant w/ meds.  . Tobacco abuse     Patient Active Problem List   Diagnosis Date Noted  . Dizziness 12/26/2018  . Paroxysmal atrial fibrillation (HCC) 01/03/2018  . Essential hypertension 01/03/2018  . Seizure disorder (HCC) 01/03/2018    Past Surgical History:  Procedure Laterality Date  . arm    . EYE SURGERY    . FOREARM SURGERY    . KNEE SURGERY      Prior to Admission medications   Medication Sig Start Date End Date Taking? Authorizing Provider  acetaminophen (TYLENOL) 500 MG tablet Take 1 tablet (500 mg total) by mouth every 6 (six) hours as needed. 07/17/18   Enid DerryWagner, Ashley, PA-C  diazepam (VALIUM) 5 MG tablet Take 1 tablet (5 mg total) by mouth every 8 (eight) hours as needed for anxiety. 01/23/19   Irean HongSung, Jade J, MD  dicyclomine (BENTYL) 10 MG capsule Take 1 capsule (10 mg total) by mouth 3 (three) times daily as needed for up to 14 days for spasms. or abdominal pain 08/17/18 08/31/18  Loleta RoseForbach, Cory, MD  erythromycin ophthalmic ointment Place 1 application into the right eye at bedtime for 7 days. 03/14/19 03/21/19  Orvil FeilWoods, Lafaye Mcelmurry M, PA-C  levETIRAcetam (KEPPRA) 500  MG tablet Take 1 tablet (500 mg total) by mouth 2 (two) times daily. 03/20/18   Loleta RoseForbach, Cory, MD  lidocaine (LIDODERM) 5 % Place 1 patch onto the skin daily. Remove & Discard patch within 12 hours or as directed by MD 01/23/19   Irean HongSung, Jade J, MD  methocarbamol (ROBAXIN) 500 MG tablet Take 1 tablet (500 mg total) by mouth 4 (four) times daily. 01/07/19   Cuthriell, Delorise RoyalsJonathan D, PA-C  metoprolol succinate (TOPROL-XL) 50 MG 24 hr tablet Take 1 tablet (50 mg total) by mouth daily. Take with or immediately following a meal. 12/26/18   Gollan, Tollie Pizzaimothy J, MD  predniSONE (DELTASONE) 50 MG tablet Take 1 tablet (50 mg total) by mouth daily with breakfast. 01/07/19   Cuthriell, Delorise RoyalsJonathan D, PA-C    Allergies Aspirin and Ibuprofen  Family History  Problem Relation Age of Onset  . Hypertension Mother   . Diabetes Mother   . Atrial fibrillation Mother   . Heart disease Father        Pt doesn't know much about father's history but says he has either a PPM or ICD.    Social History Social History   Tobacco Use  . Smoking status: Current Every Day Smoker    Packs/day: 0.50    Years: 20.00    Pack years: 10.00  . Smokeless tobacco: Never Used  .  Tobacco comment: currently smoking 0.5 - 1 ppd.  Substance Use Topics  . Alcohol use: No  . Drug use: No     Review of Systems  Constitutional: No fever/chills Eyes: Patient has right eye conjunctivitis. ENT: No upper respiratory complaints. Cardiovascular: no chest pain. Respiratory: no cough. No SOB. Gastrointestinal: No abdominal pain.  No nausea, no vomiting.  No diarrhea.  No constipation. Musculoskeletal: Negative for musculoskeletal pain. Skin: Negative for rash, abrasions, lacerations, ecchymosis. Neurological: Negative for headaches, focal weakness or numbness.   ____________________________________________   PHYSICAL EXAM:  VITAL SIGNS: ED Triage Vitals [03/14/19 1943]  Enc Vitals Group     BP (!) 170/113     Pulse Rate 92     Resp  17     Temp 98.7 F (37.1 C)     Temp Source Oral     SpO2 98 %     Weight 180 lb (81.6 kg)     Height 6' (1.829 m)     Head Circumference      Peak Flow      Pain Score 7     Pain Loc      Pain Edu?      Excl. in [place]?      Constitutional: Alert and oriented. Well appearing and in no acute distress. Eyes: Patient has conjunctivitis of right eye.  No perilimbal erythema.  PERRL. EOMI. Head: Atraumatic. ENT:      Nose: No congestion/rhinnorhea.      Mouth/Throat: Mucous membranes are moist. Cardiovascular: Normal rate, regular rhythm. Normal S1 and S2.  Good peripheral circulation. Respiratory: Normal respiratory effort without tachypnea or retractions. Lungs CTAB. Good air entry to the bases with no decreased or absent breath sounds. Skin:  Skin is warm, dry and intact. No rash noted. Psychiatric: Mood and affect are normal. Speech and behavior are normal. Patient exhibits appropriate insight and judgement.   ____________________________________________   LABS (all labs ordered are listed, but only abnormal results are displayed)  Labs Reviewed - No data to display ____________________________________________  EKG   ____________________________________________  RADIOLOGY   No results found.  ____________________________________________    PROCEDURES  Procedure(s) performed:    Procedures    Medications - No data to display   ____________________________________________   INITIAL IMPRESSION / ASSESSMENT AND PLAN / ED COURSE  Pertinent labs & imaging results that were available during my care of the patient were reviewed by me and considered in my medical decision making (see chart for details).  Review of the [place] CSRS was performed in accordance of the [place] prior to dispensing any controlled drugs.        Assessment and plan Right eye conjunctivitis Patient presents to the emergency department with right eye irritation, increased tearing and  pruritus for the past 2 to 3 days.  History and physical exam findings are consistent with right eye conjunctivitis.  He was discharged with erythromycin ointment.  He was advised to follow-up with primary care as needed.  All patient questions were answered. ____________________________________________  FINAL CLINICAL IMPRESSION(S) / ED DIAGNOSES  Final diagnoses:  Acute conjunctivitis of right eye, unspecified acute conjunctivitis type      NEW MEDICATIONS STARTED DURING THIS VISIT:  ED Discharge Orders         Ordered    erythromycin ophthalmic ointment  Daily at bedtime     03/14/19 2224              This chart was dictated using voice recognition  software/Dragon. Despite best efforts to proofread, errors can occur which can change the meaning. Any change was purely unintentional.    Orvil FeilWoods, Reannah Totten M, PA-C 03/14/19 2233    Shaune PollackIsaacs, Cameron, MD 03/15/19 1046

## 2019-03-14 NOTE — ED Notes (Signed)
Pt c/o right eye pain x3 days. Pt denies getting anything in his eye and states that it itches and burns constantly.

## 2019-03-14 NOTE — ED Triage Notes (Signed)
Patient c/o right eye pain/itching/watering X 2-3 days. Patient denies injury. Patient reports intermittent blurred vision.

## 2019-03-20 ENCOUNTER — Encounter: Payer: Self-pay | Admitting: Emergency Medicine

## 2019-03-20 ENCOUNTER — Other Ambulatory Visit: Payer: Self-pay

## 2019-03-20 DIAGNOSIS — F17210 Nicotine dependence, cigarettes, uncomplicated: Secondary | ICD-10-CM | POA: Diagnosis not present

## 2019-03-20 DIAGNOSIS — Z79899 Other long term (current) drug therapy: Secondary | ICD-10-CM | POA: Insufficient documentation

## 2019-03-20 DIAGNOSIS — I10 Essential (primary) hypertension: Secondary | ICD-10-CM | POA: Diagnosis not present

## 2019-03-20 NOTE — ED Triage Notes (Signed)
Pt presents to ED after syncopal episode witnessed by his mom and brother. Pt had said his brother caught him so he did not hit his head on the ground. Denies pain or injury. Pt able to answer questions without difficulty. Pt also reports that his blood pressure has been running high lately. Has been taking BP medications as prescribed.

## 2019-03-21 ENCOUNTER — Emergency Department
Admission: EM | Admit: 2019-03-21 | Discharge: 2019-03-21 | Disposition: A | Discharge status: Home / Self Care | Payer: Medicaid Other | Attending: Student in an Organized Health Care Education/Training Program | Admitting: Student in an Organized Health Care Education/Training Program

## 2019-03-21 DIAGNOSIS — I10 Essential (primary) hypertension: Secondary | ICD-10-CM

## 2019-03-21 LAB — URINALYSIS, COMPLETE (UACMP) WITH MICROSCOPIC
Bacteria, UA: NONE SEEN
Bilirubin Urine: NEGATIVE
Glucose, UA: NEGATIVE mg/dL
Hgb urine dipstick: NEGATIVE
Ketones, ur: NEGATIVE mg/dL
Leukocytes,Ua: NEGATIVE
Nitrite: NEGATIVE
Protein, ur: NEGATIVE mg/dL
Specific Gravity, Urine: 1.03 (ref 1.005–1.030)
WBC, UA: NONE SEEN WBC/hpf (ref 0–5)
pH: 5 (ref 5.0–8.0)

## 2019-03-21 LAB — CBC
HCT: 48.7 % (ref 39.0–52.0)
Hemoglobin: 16.4 g/dL (ref 13.0–17.0)
MCH: 30.5 pg (ref 26.0–34.0)
MCHC: 33.7 g/dL (ref 30.0–36.0)
MCV: 90.5 fL (ref 80.0–100.0)
Platelets: 236 10*3/uL (ref 150–400)
RBC: 5.38 MIL/uL (ref 4.22–5.81)
RDW: 13 % (ref 11.5–15.5)
WBC: 11.1 10*3/uL — ABNORMAL HIGH (ref 4.0–10.5)
nRBC: 0 % (ref 0.0–0.2)

## 2019-03-21 LAB — BASIC METABOLIC PANEL
Anion gap: 9 (ref 5–15)
BUN: 14 mg/dL (ref 6–20)
CO2: 28 mmol/L (ref 22–32)
Calcium: 9.1 mg/dL (ref 8.9–10.3)
Chloride: 104 mmol/L (ref 98–111)
Creatinine, Ser: 1.07 mg/dL (ref 0.61–1.24)
GFR calc Af Amer: >60 mL/min (ref >60)
GFR calc non Af Amer: >60 mL/min (ref >60)
Glucose, Bld: 96 mg/dL (ref 70–99)
Potassium: 3.9 mmol/L (ref 3.5–5.1)
Sodium: 141 mmol/L (ref 135–145)

## 2019-03-21 MED ORDER — AMLODIPINE BESYLATE 5 MG PO TABS
5.0000 mg | ORAL_TABLET | Freq: Once | ORAL | Status: AC
  Administered 2019-03-21: 5 mg via ORAL
  Filled 2019-03-21: qty 1

## 2019-03-21 MED ORDER — AMLODIPINE BESYLATE 5 MG PO TABS: 5.0000 mg | ORAL_TABLET | Freq: Every day | ORAL | 0 refills | Status: DC

## 2019-03-21 NOTE — ED Provider Notes (Signed)
[facility] Emergency Department Provider Note    First MD Initiated Contact with Patient 03/21/19 [phone]     (approximate)  I have reviewed the triage vital signs and the nursing notes.   HISTORY  Chief Complaint Loss of Consciousness and Hypertension    HPI Jeremy Yoder is a 38 y.o. male presents the ER for evaluation of high blood pressure.  Patient states that he has been on blood pressure medication for several years and has been compliant with these but "they ain't helping.  ".  Patient had a fainting spell that was witnessed by his wife.  Has long history of doing the same.  Has reported history of seizure as well as noncompliance with medications.  States the episode but did not seem like a seizure.  He is not having any chest pain.  States that he has having a headache but has chronic daily headaches.  This is not the worst headache of his life.  Denies any numbness or tingling.  States that he came to the ER via POV after EMS was called by his wife and found the patient to have significantly elevated blood pressure.   Refused transport via EMS.  States he otherwise feels fine.  Denies any shortness of breath or pressure.  No fevers.   Past Medical History:  Diagnosis Date  . A-fib ([place])   . Hypertension    a. Noncompliant w/ meds.  . Seizures ([place])    a. Dx in teens. Noncompliant w/ meds.  . Tobacco abuse    Family History  Problem Relation Age of Onset  . Hypertension Mother   . Diabetes Mother   . Atrial fibrillation Mother   . Heart disease Father        Pt doesn't know much about father's history but says he has either a PPM or ICD.   Past Surgical History:  Procedure Laterality Date  . arm    . EYE SURGERY    . FOREARM SURGERY    . KNEE SURGERY     Patient Active Problem List   Diagnosis Date Noted  . Dizziness 12/26/2018  . Paroxysmal atrial fibrillation ([place]) 01/03/2018  . Essential hypertension 01/03/2018  . Seizure disorder  ([place]) 01/03/2018      Prior to Admission medications   Medication Sig Start Date End Date Taking? Authorizing Provider  acetaminophen (TYLENOL) 500 MG tablet Take 1 tablet (500 mg total) by mouth every 6 (six) hours as needed. 07/17/18   Laban Emperor, PA-C  amLODipine (NORVASC) 5 MG tablet Take 1 tablet (5 mg total) by mouth daily. 03/21/19 03/20/20  Merlyn Lot, MD  diazepam (VALIUM) 5 MG tablet Take 1 tablet (5 mg total) by mouth every 8 (eight) hours as needed for anxiety. 01/23/19   Paulette Blanch, MD  dicyclomine (BENTYL) 10 MG capsule Take 1 capsule (10 mg total) by mouth 3 (three) times daily as needed for up to 14 days for spasms. or abdominal pain 08/17/18 08/31/18  Hinda Kehr, MD  erythromycin ophthalmic ointment Place 1 application into the right eye at bedtime for 7 days. 03/14/19 03/21/19  Lannie Fields, PA-C  levETIRAcetam (KEPPRA) 500 MG tablet Take 1 tablet (500 mg total) by mouth 2 (two) times daily. 03/20/18   Hinda Kehr, MD  lidocaine (LIDODERM) 5 % Place 1 patch onto the skin daily. Remove & Discard patch within 12 hours or as directed by MD 01/23/19   Paulette Blanch, MD  methocarbamol (ROBAXIN)  500 MG tablet Take 1 tablet (500 mg total) by mouth 4 (four) times daily. 01/07/19   Cuthriell, Delorise RoyalsJonathan D, PA-C  metoprolol succinate (TOPROL-XL) 50 MG 24 hr tablet Take 1 tablet (50 mg total) by mouth daily. Take with or immediately following a meal. 12/26/18   Gollan, Tollie Pizzaimothy J, MD  predniSONE (DELTASONE) 50 MG tablet Take 1 tablet (50 mg total) by mouth daily with breakfast. 01/07/19   Cuthriell, Delorise RoyalsJonathan D, PA-C    Allergies Aspirin and Ibuprofen    Social History Social History   Tobacco Use  . Smoking status: Current Every Day Smoker    Packs/day: 0.50    Years: 20.00    Pack years: 10.00  . Smokeless tobacco: Never Used  . Tobacco comment: currently smoking 0.5 - 1 ppd.  Substance Use Topics  . Alcohol use: No  . Drug use: No    Review of Systems Patient denies  headaches, rhinorrhea, blurry vision, numbness, shortness of breath, chest pain, edema, cough, abdominal pain, nausea, vomiting, diarrhea, dysuria, fevers, rashes or hallucinations unless otherwise stated above in HPI. ____________________________________________   PHYSICAL EXAM:  VITAL SIGNS: Vitals:   03/21/19 0049 03/21/19 0122  BP: (!) 181/104 (!) 142/108  Pulse: 71   Resp:    Temp:    SpO2: 97%     Constitutional: Alert and oriented.  Eyes: Conjunctivae are normal.  Head: Atraumatic. Nose: No congestion/rhinnorhea. Mouth/Throat: Mucous membranes are moist.   Neck: No stridor. Painless ROM.  Cardiovascular: Normal rate, regular rhythm. Grossly normal heart sounds.  Good peripheral circulation. Respiratory: Normal respiratory effort.  No retractions. Lungs CTAB. Gastrointestinal: Soft and nontender. No distention. No abdominal bruits. No CVA tenderness. Genitourinary:  Musculoskeletal: No lower extremity tenderness nor edema.  No joint effusions.  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  No facial droop, Normal FNF.  Normal heel to shin.  Sensation intact bilaterally. Normal speech and language. No gross focal neurologic deficits are appreciated. No gait instability. Skin:  Skin is warm, dry and intact. No rash noted. Psychiatric: Mood and affect are normal. Speech and behavior are normal.  ____________________________________________   LABS (all labs ordered are listed, but only abnormal results are displayed)  Results for orders placed or performed during the hospital encounter of 03/21/19 (from the past 24 hour(s))  Basic metabolic panel     Status: None   Collection Time: 03/20/19 11:56 PM  Result Value Ref Range   Sodium 141 135 - 145 mmol/L   Potassium 3.9 3.5 - 5.1 mmol/L   Chloride 104 98 - 111 mmol/L   CO2 28 22 - 32 mmol/L   Glucose, Bld 96 70 - 99 mg/dL   BUN 14 6 - 20 mg/dL   Creatinine, Ser 1.611.07 0.61 - 1.24 mg/dL   Calcium 9.1  8.9 - 09.610.3 mg/dL   GFR calc non Af Amer >60 >60 mL/min   GFR calc Af Amer >60 >60 mL/min   Anion gap 9 5 - 15  CBC     Status: Abnormal   Collection Time: 03/20/19 11:56 PM  Result Value Ref Range   WBC 11.1 (H) 4.0 - 10.5 K/uL   RBC 5.38 4.22 - 5.81 MIL/uL   Hemoglobin 16.4 13.0 - 17.0 g/dL   HCT 04.548.7 40.939.0 - 81.152.0 %   MCV 90.5 80.0 - 100.0 fL   MCH 30.5 26.0 - 34.0 pg   MCHC 33.7 30.0 - 36.0 g/dL   RDW 91.413.0 78.211.5 - 95.615.5 %  Platelets 236 150 - 400 K/uL   nRBC 0.0 0.0 - 0.2 %  Urinalysis, Complete w Microscopic     Status: Abnormal   Collection Time: 03/20/19 11:56 PM  Result Value Ref Range   Color, Urine YELLOW (A) YELLOW   APPearance CLEAR (A) CLEAR   Specific Gravity, Urine 1.030 1.005 - 1.030   pH 5.0 5.0 - 8.0   Glucose, UA NEGATIVE NEGATIVE mg/dL   Hgb urine dipstick NEGATIVE NEGATIVE   Bilirubin Urine NEGATIVE NEGATIVE   Ketones, ur NEGATIVE NEGATIVE mg/dL   Protein, ur NEGATIVE NEGATIVE mg/dL   Nitrite NEGATIVE NEGATIVE   Leukocytes,Ua NEGATIVE NEGATIVE   RBC / HPF 0-5 0 - 5 RBC/hpf   WBC, UA NONE SEEN 0 - 5 WBC/hpf   Bacteria, UA NONE SEEN NONE SEEN   Squamous Epithelial / LPF 0-5 0 - 5   Mucus PRESENT    ____________________________________________  EKG My review and personal interpretation at Time: 23:50   Indication: fainting  Rate: 85  Rhythm: sinus Axis: normal Other: normal intervals, no stemi ____________________________________________  RADIOLOGY  I personally reviewed all radiographic images ordered to evaluate for the above acute complaints and reviewed radiology reports and findings.  These findings were personally discussed with the patient.  Please see medical record for radiology report.  ____________________________________________   PROCEDURES  Procedure(s) performed:  Procedures    Critical Care performed: no ____________________________________________   INITIAL IMPRESSION / ASSESSMENT AND PLAN / ED COURSE  Pertinent labs &  imaging results that were available during my care of the patient were reviewed by me and considered in my medical decision making (see chart for details).   DDX: Hypertensive urgency, essential hypertension, dysrhythmia, seizure, orthostasis, pseudoseizure, medication noncompliance, electrolyte abnormality  Jeremy DoveJoseph R Yoder is a 38 y.o. who presents to the ED with symptoms as described above.  Patient well-appearing and nontoxic.  Has reassuring and nonfocal neuro exam.  Do not feel that emergent neuroimaging clinically indicated given benign exam and patient feels that headaches are consistent with previous chronic daily headaches.  Is not consistent with SAH or infectious process.  No signs of preexcitation syndrome on EKG.  Will give Norvasc for his elevated blood pressure.  Patient otherwise feeling well.  Not consistent with press or hypertensive encephalopathy.  will be given referral for outpatient follow-up regarding his elevated blood pressure.  BP improved with oral antihypertensive in the form of norvasc.  Will give rx for this.  Have discussed with the patient and available family all diagnostics and treatments performed thus far and all questions were answered to the best of my ability. The patient demonstrates understanding and agreement with plan.      The patient was evaluated in Emergency Department today for the symptoms described in the history of present illness. He/she was evaluated in the context of the global COVID-19 pandemic, which necessitated consideration that the patient might be at risk for infection with the SARS-CoV-2 virus that causes COVID-19. Institutional protocols and algorithms that pertain to the evaluation of patients at risk for COVID-19 are in a state of rapid change based on information released by regulatory bodies including the CDC and federal and state organizations. These policies and algorithms were followed during the patient's care in the ED.  As part of my  medical decision making, I reviewed the following data within the electronic MEDICAL RECORD NUMBER Nursing notes reviewed and incorporated, Labs reviewed, notes from prior ED visits and [place] Controlled Substance Database   ____________________________________________  FINAL CLINICAL IMPRESSION(S) / ED DIAGNOSES  Final diagnoses:  Hypertension, unspecified type      NEW MEDICATIONS STARTED DURING THIS VISIT:  New Prescriptions   AMLODIPINE (NORVASC) 5 MG TABLET    Take 1 tablet (5 mg total) by mouth daily.     Note:  This document was prepared using Dragon voice recognition software and may include unintentional dictation errors.    Willy Eddyobinson, Aviv Rota, MD 03/21/19 [id number]

## 2019-03-21 NOTE — ED Notes (Signed)
ED Provider at bedside. 

## 2019-04-07 ENCOUNTER — Encounter: Payer: Self-pay | Admitting: Emergency Medicine

## 2019-04-07 ENCOUNTER — Emergency Department
Admission: EM | Admit: 2019-04-07 | Discharge: 2019-04-07 | Disposition: A | Discharge status: Home / Self Care | Payer: Medicaid Other | Attending: Emergency Medicine | Admitting: Emergency Medicine

## 2019-04-07 ENCOUNTER — Other Ambulatory Visit: Payer: Self-pay

## 2019-04-07 DIAGNOSIS — F17200 Nicotine dependence, unspecified, uncomplicated: Secondary | ICD-10-CM | POA: Insufficient documentation

## 2019-04-07 DIAGNOSIS — I480 Paroxysmal atrial fibrillation: Secondary | ICD-10-CM | POA: Insufficient documentation

## 2019-04-07 DIAGNOSIS — R05 Cough: Secondary | ICD-10-CM

## 2019-04-07 DIAGNOSIS — I10 Essential (primary) hypertension: Secondary | ICD-10-CM | POA: Insufficient documentation

## 2019-04-07 DIAGNOSIS — R059 Cough, unspecified: Secondary | ICD-10-CM

## 2019-04-07 DIAGNOSIS — R569 Unspecified convulsions: Secondary | ICD-10-CM | POA: Diagnosis present

## 2019-04-07 DIAGNOSIS — Z79899 Other long term (current) drug therapy: Secondary | ICD-10-CM | POA: Diagnosis not present

## 2019-04-07 LAB — BASIC METABOLIC PANEL
Anion gap: 8 (ref 5–15)
BUN: 12 mg/dL (ref 6–20)
CO2: 27 mmol/L (ref 22–32)
Calcium: 8.7 mg/dL — ABNORMAL LOW (ref 8.9–10.3)
Chloride: 105 mmol/L (ref 98–111)
Creatinine, Ser: 0.85 mg/dL (ref 0.61–1.24)
GFR calc Af Amer: >60 mL/min (ref >60)
GFR calc non Af Amer: >60 mL/min (ref >60)
Glucose, Bld: 97 mg/dL (ref 70–99)
Potassium: 4 mmol/L (ref 3.5–5.1)
Sodium: 140 mmol/L (ref 135–145)

## 2019-04-07 LAB — CBC
HCT: 47.9 % (ref 39.0–52.0)
Hemoglobin: 16.1 g/dL (ref 13.0–17.0)
MCH: 30.3 pg (ref 26.0–34.0)
MCHC: 33.6 g/dL (ref 30.0–36.0)
MCV: 90 fL (ref 80.0–100.0)
Platelets: 212 10*3/uL (ref 150–400)
RBC: 5.32 MIL/uL (ref 4.22–5.81)
RDW: 12.8 % (ref 11.5–15.5)
WBC: 9.9 10*3/uL (ref 4.0–10.5)
nRBC: 0 % (ref 0.0–0.2)

## 2019-04-07 MED ORDER — LEVETIRACETAM 500 MG PO TABS: 500.0000 mg | ORAL_TABLET | Freq: Once | ORAL | Status: DC

## 2019-04-07 MED ORDER — LEVETIRACETAM 500 MG PO TABS
1000.0000 mg | ORAL_TABLET | Freq: Once | ORAL | Status: DC
  Filled 2019-04-07: qty 2

## 2019-04-07 MED ORDER — AMLODIPINE BESYLATE 5 MG PO TABS
5.0000 mg | ORAL_TABLET | Freq: Once | ORAL | Status: AC
  Administered 2019-04-07: 06:00:00 5 mg via ORAL
  Filled 2019-04-07: qty 1
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
## 2019-04-07 NOTE — ED Notes (Signed)
Patient states he is not worried that his wife said he had a seizure but rather he is more worried about this blood pressure. States he was taking two blood pressure medicines but ran out of one and doesn't have a refill for it. [name]'t [facility] him because "I guess I owe them money." States he took it upon himself to take both BP meds and it helped but now he's out. Patient is alert and oriented, denies CP or shortness of breath. States he has a cough but he "does smoke cigarettes." Patient states he was just here for his blood pressure.

## 2019-04-07 NOTE — ED Triage Notes (Signed)
Patient ambulatory to triage with complaints of hypertension and "my wife said I had a seizure in my sleep".    Pt reports pain when coughing d/t "smoker's cough" Pt reports taking albuterol inhaler for cough and reports taking meds for seizures and HTN.  Speaking in complete coherent sentences. No acute breathing distress noted.

## 2019-04-07 NOTE — Discharge Instructions (Addendum)
Please call neurology Dr. Trena Platt office this morning.  Please discussed with him your episode of possible seizure last night and obtain any recommendations for medication adjustments from him this morning.  You have been seen in the emergency department today for a seizure.  Your workup today including labs are within normal limits.  Please follow up with your doctor/neurologist as soon as possible regarding today's emergency department visit and your likely seizure.  As we have discussed it is very important that you do not drive until you have been seen and cleared by your neurologist.  Please drink plenty of fluids, get plenty of sleep and avoid any alcohol or drug use Please return to the emergency department if you have any further seizures which do not respond to medications, or for any other symptoms per se concerning for yourself.

## 2019-04-07 NOTE — ED Provider Notes (Signed)
-----------------------------------------   7:03 AM on 04/07/2019 -----------------------------------------  Blood pressure (!) 178/104, pulse 82, temperature 98.2 F (36.8 C), temperature source Oral, resp. rate 18, height 5\' 11"  (1.803 m), weight 83.9 kg, SpO2 100 %.  Assuming care from Dr. Jacqualine Code.  In short, Jeremy Yoder is a 38 y.o. male with a chief complaint of Seizures .  Refer to the original H&P for additional details.  The current plan of care is to Discharge home with close Neurology follow-up once labs resulted.  7:46 AM Labs unremarkable and blood pressure gradually improving.  Patient requesting to be discharged home, stressed importance of patient discussing his antiepileptic medications with his neurologist this morning, patient agrees with plan.    Blake Divine, MD 04/07/19 [phone]

## 2019-04-08 LAB — LAMOTRIGINE LEVEL: Lamotrigine Lvl: 1.3 ug/mL — ABNORMAL LOW (ref 2.0–20.0)

## 2019-04-28 ENCOUNTER — Encounter: Payer: Self-pay | Admitting: Emergency Medicine

## 2019-04-28 ENCOUNTER — Emergency Department
Admission: EM | Admit: 2019-04-28 | Discharge: 2019-04-28 | Disposition: A | Discharge status: Home / Self Care | Payer: Medicaid Other | Attending: Student in an Organized Health Care Education/Training Program | Admitting: Student in an Organized Health Care Education/Training Program

## 2019-04-28 DIAGNOSIS — F17200 Nicotine dependence, unspecified, uncomplicated: Secondary | ICD-10-CM | POA: Diagnosis not present

## 2019-04-28 DIAGNOSIS — Z79899 Other long term (current) drug therapy: Secondary | ICD-10-CM | POA: Insufficient documentation

## 2019-04-28 DIAGNOSIS — I10 Essential (primary) hypertension: Secondary | ICD-10-CM | POA: Diagnosis not present

## 2019-04-28 DIAGNOSIS — K0889 Other specified disorders of teeth and supporting structures: Secondary | ICD-10-CM | POA: Insufficient documentation

## 2019-04-28 MED ORDER — AMOXICILLIN 875 MG PO TABS: 875.0000 mg | ORAL_TABLET | Freq: Two times a day (BID) | ORAL | 0 refills | Status: AC

## 2019-04-28 MED ORDER — TRAMADOL HCL 50 MG PO TABS
50.0000 mg | ORAL_TABLET | Freq: Once | ORAL | Status: AC
  Administered 2019-04-28: 23:00:00 50 mg via ORAL
  Filled 2019-04-28: qty 1

## 2019-04-28 MED ORDER — AMOXICILLIN 500 MG PO CAPS
500.0000 mg | ORAL_CAPSULE | Freq: Once | ORAL | Status: AC
  Administered 2019-04-28: 23:00:00 500 mg via ORAL
  Filled 2019-04-28: qty 1

## 2019-04-28 MED ORDER — TRAMADOL HCL 50 MG PO TABS: 50.0000 mg | ORAL_TABLET | Freq: Four times a day (QID) | ORAL | 0 refills | Status: AC | PRN

## 2019-04-28 NOTE — ED Provider Notes (Signed)
[facility] Emergency Department Provider Note  ____________________________________________  Time seen: Approximately 11:21 PM  I have reviewed the triage vital signs and the nursing notes.   HISTORY  Chief Complaint Dental Pain    HPI LADISLAV CASELLI is a 38 y.o. male presents to the emergency department with dental pain of inferior 30.  Patient has noticed some swelling along right lower jaw.  Patient has had no pain underneath the tongue or swelling at the neck.  He has been afebrile at home.  Patient states that he has been taking Tylenol and his pain has not improved.  He has contacted [facility] and reports that they are "booked up".  No other alleviating measures have been attempted.        Past Medical History:  Diagnosis Date  . A-fib ([place])   . Hypertension    a. Noncompliant w/ meds.  . Seizures ([place])    a. Dx in teens. Noncompliant w/ meds.  . Tobacco abuse     Patient Active Problem List   Diagnosis Date Noted  . Dizziness 12/26/2018  . Paroxysmal atrial fibrillation ([place]) 01/03/2018  . Essential hypertension 01/03/2018  . Seizure disorder ([place]) 01/03/2018    Past Surgical History:  Procedure Laterality Date  . arm    . EYE SURGERY    . FOREARM SURGERY    . KNEE SURGERY      Prior to Admission medications   Medication Sig Start Date End Date Taking? Authorizing Provider  acetaminophen (TYLENOL) 500 MG tablet Take 1 tablet (500 mg total) by mouth every 6 (six) hours as needed. 07/17/18   Laban Emperor, PA-C  amLODipine (NORVASC) 5 MG tablet Take 1 tablet (5 mg total) by mouth daily. 03/21/19 03/20/20  Merlyn Lot, MD  amoxicillin (AMOXIL) 875 MG tablet Take 1 tablet (875 mg total) by mouth 2 (two) times daily for 10 days. 04/28/19 05/08/19  Lannie Fields, PA-C  diazepam (VALIUM) 5 MG tablet Take 1 tablet (5 mg total) by mouth every 8 (eight) hours as needed for anxiety. 01/23/19   Paulette Blanch, MD  dicyclomine  (BENTYL) 10 MG capsule Take 1 capsule (10 mg total) by mouth 3 (three) times daily as needed for up to 14 days for spasms. or abdominal pain 08/17/18 08/31/18  Hinda Kehr, MD  levETIRAcetam (KEPPRA) 500 MG tablet Take 1 tablet (500 mg total) by mouth 2 (two) times daily. 03/20/18   Hinda Kehr, MD  lidocaine (LIDODERM) 5 % Place 1 patch onto the skin daily. Remove & Discard patch within 12 hours or as directed by MD 01/23/19   Paulette Blanch, MD  methocarbamol (ROBAXIN) 500 MG tablet Take 1 tablet (500 mg total) by mouth 4 (four) times daily. 01/07/19   Cuthriell, Charline Bills, PA-C  metoprolol succinate (TOPROL-XL) 50 MG 24 hr tablet Take 1 tablet (50 mg total) by mouth daily. Take with or immediately following a meal. 12/26/18   Gollan, Kathlene November, MD  predniSONE (DELTASONE) 50 MG tablet Take 1 tablet (50 mg total) by mouth daily with breakfast. 01/07/19   Cuthriell, Charline Bills, PA-C  traMADol (ULTRAM) 50 MG tablet Take 1 tablet (50 mg total) by mouth every 6 (six) hours as needed for up to 3 days. 04/28/19 05/01/19  Lannie Fields, PA-C    Allergies Aspirin and Ibuprofen  Family History  Problem Relation Age of Onset  . Hypertension Mother   . Diabetes Mother   . Atrial fibrillation Mother   .  Heart disease Father        Pt doesn't know much about father's history but says he has either a PPM or ICD.    Social History Social History   Tobacco Use  . Smoking status: Current Every Day Smoker    Packs/day: 0.50    Years: 20.00    Pack years: 10.00  . Smokeless tobacco: Never Used  . Tobacco comment: currently smoking 0.5 - 1 ppd.  Substance Use Topics  . Alcohol use: No  . Drug use: No     Review of Systems  Constitutional: No fever/chills Eyes: No visual changes. No discharge ENT: Patient has dental pain. Cardiovascular: no chest pain. Respiratory: no cough. No SOB. Gastrointestinal: No abdominal pain.  No nausea, no vomiting.  No diarrhea.  No constipation. Genitourinary: Negative  for dysuria. No hematuria Musculoskeletal: Negative for musculoskeletal pain. Skin: Negative for rash, abrasions, lacerations, ecchymosis. Neurological: Negative for headaches, focal weakness or numbness.   ____________________________________________   PHYSICAL EXAM:  VITAL SIGNS: ED Triage Vitals [04/28/19 2105]  Enc Vitals Group     BP (!) 178/90     Pulse Rate 92     Resp 20     Temp 99 F (37.2 C)     Temp Source Oral     SpO2 99 %     Weight      Height      Head Circumference      Peak Flow      Pain Score      Pain Loc      Pain Edu?      Excl. in GC?      Constitutional: Alert and oriented. Well appearing and in no acute distress. Eyes: Conjunctivae are normal. PERRL. EOMI. Head: Atraumatic. ENT:      Ears: TMs are pearly.      Nose: No congestion/rhinnorhea.      Mouth/Throat: Mucous membranes are moist.  Patient has broken inferior 30. Neck: No stridor.  No cervical spine tenderness to palpation. Cardiovascular: Normal rate, regular rhythm. Normal S1 and S2.  Good peripheral circulation. Respiratory: Normal respiratory effort without tachypnea or retractions. Lungs CTAB. Good air entry to the bases with no decreased or absent breath sounds. Musculoskeletal: Full range of motion to all extremities. No gross deformities appreciated. Neurologic:  Normal speech and language. No gross focal neurologic deficits are appreciated.  Skin:  Skin is warm, dry and intact. No rash noted. Psychiatric: Mood and affect are normal. Speech and behavior are normal. Patient exhibits appropriate insight and judgement.   ____________________________________________   LABS (all labs ordered are listed, but only abnormal results are displayed)  Labs Reviewed - No data to display ____________________________________________  EKG   ____________________________________________  RADIOLOGY   No results  found.  ____________________________________________    PROCEDURES  Procedure(s) performed:    Procedures    Medications  traMADol (ULTRAM) tablet 50 mg (has no administration in time range)  amoxicillin (AMOXIL) capsule 500 mg (has no administration in time range)     ____________________________________________   INITIAL IMPRESSION / ASSESSMENT AND PLAN / ED COURSE  Pertinent labs & imaging results that were available during my care of the patient were reviewed by me and considered in my medical decision making (see chart for details).  Review of the  CSRS was performed in accordance of the [facility] prior to dispensing any controlled drugs.        Assessment and Plan:  Dental pain 38 year old male presents to  the emergency department with a broken inferior 30.  Patient was given tramadol in the emergency department and he was discharged with amoxicillin.  All patient questions were answered.     ____________________________________________  FINAL CLINICAL IMPRESSION(S) / ED DIAGNOSES  Final diagnoses:  Pain, dental      NEW MEDICATIONS STARTED DURING THIS VISIT:  ED Discharge Orders         Ordered    amoxicillin (AMOXIL) 875 MG tablet  2 times daily     04/28/19 2315    traMADol (ULTRAM) 50 MG tablet  Every 6 hours PRN     04/28/19 2318              This chart was dictated using voice recognition software/Dragon. Despite best efforts to proofread, errors can occur which can change the meaning. Any change was purely unintentional.    Orvil FeilWoods, Amen Dargis M, PA-C 04/28/19 2327    Willy Eddyobinson, Patrick, MD 04/28/19 [phone]

## 2019-04-28 NOTE — ED Triage Notes (Signed)
Pt c/o right upper tooth pain after tooth broke off x3 days ago while eating chips. Swelling noted to area as well as poor oral hygiene.

## 2019-04-28 NOTE — ED Notes (Signed)
Pt refusing vitals at time of D/C.

## 2019-04-28 NOTE — Discharge Instructions (Signed)
OPTIONS FOR DENTAL FOLLOW UP CARE ° °[place] Department of Health and Human Services - Local Safety Net Dental Clinics °http://www.ncdhhs.gov/dph/oralhealth/services/safetynetclinics.htm °  °[facility] (336-562-3123) ° °[facility] ([phone]) ° °[facility] ([phone] ext [phone]) ° °[facility] ([phone]) ° °[facility] ([phone]) °This clinic caters to the indigent population and is on a lottery system. °Location: °[facility], [address], [place] °Clinic Hours: °Wednesdays from 6pm - 9pm, patients seen by a lottery system. °For dates, call or go to [email or web address]/[facility]/[email or web address] °Services: °Cleanings, fillings and simple extractions. °Payment Options: °DENTAL WORK IS FREE OF CHARGE. Bring proof of income or support. °Best way to get seen: °Arrive at 5:15 pm - this is a lottery, NOT first come/first serve, so arriving earlier will not increase your chances of being seen. °  °  °[facility] °[phone] °Select option 1 for emergencies °  °Location: °[facility], [address], [place] °Clinic Hours: °No walk-ins accepted - call the day before to schedule an appointment. °Check in times are 9:30 am and 1:30 pm. °Services: °Simple extractions, temporary fillings, pulpectomy/pulp debridement, uncomplicated abscess drainage. °Payment Options: °PAYMENT IS DUE AT THE TIME OF SERVICE.  Fee is usually $100-200, additional surgical procedures (e.g. abscess drainage) may be extra. °Cash, checks, Visa/MasterCard accepted.  Can file Medicaid if patient is covered for dental - patient should call case worker to check. °No discount for [facility] patients. °Best way to get seen: °MUST call the day before and get onto the schedule. Can usually be seen the next 1-2 days. No walk-ins accepted. °  °  °[facility] °[phone] °   °Location: °[facility], [address], [place] °Clinic Hours: °M, W, Th, F 8am or 1:30pm, Tues 9a or 1:30 - first come/first served. °Services: °Simple extractions, temporary fillings, uncomplicated abscess drainage.  You do not need to be an [place] resident. °Payment Options: °PAYMENT IS DUE AT THE TIME OF SERVICE. °Dental insurance, otherwise sliding scale - bring proof of income or support. °Depending on income and treatment needed, cost is usually $50-200. °Best way to get seen: °Arrive early as it is first come/first served. °  °  °[facility] °[phone] °  °Location: °[address] °Clinic Hours: °Mon-Thu 8a-5p °Services: °Most basic dental services including extractions and fillings. °Payment Options: °PAYMENT IS DUE AT THE TIME OF SERVICE. °Sliding scale, up to 50% off - bring proof if income or support. °Medicaid with dental option accepted. °Best way to get seen: °Call to schedule an appointment, can usually be seen within 2 weeks OR they will try to see walk-ins - show up at 8a or 2p (you may have to wait). °  °  °[facility] °[phone] °[place] RESIDENTS ONLY °  °Location: °[facility], [address], [place], [place] [address] °Clinic Hours: By appointment only. °Monday - Thursday 8am-5pm, Friday 8am-12pm °Services: Cleanings, fillings, extractions. °Payment Options: °PAYMENT IS DUE AT THE TIME OF SERVICE. °Cash, Visa or MasterCard. Sliding scale - $30 minimum per service. °Best way to get seen: °Come in to office, complete packet and make an appointment - need proof of income °or support monies for each household member and proof of [place] residence. °Usually takes about a month to get in. °  °  °[facility] °[phone] °  °Location: °[address]   [place] °Clinic Hours: Walk-in Urgent Care Dental Services are offered Monday-Friday  mornings only. °The numbers of emergencies accepted daily is limited to the number of °providers available. °Maximum 15 - Mondays, Wednesdays & Thursdays °Maximum 10 - Tuesdays & Fridays °Services: °You do not need to be a [place] resident to be seen for a dental emergency. °Emergencies are defined as pain, swelling, abnormal bleeding, or dental trauma. Walkins will receive x-rays if needed. °NOTE: Dental cleaning is not an emergency. °Payment Options: °PAYMENT IS DUE AT THE TIME OF SERVICE. °Minimum co-pay is $40.00 for uninsured patients. °Minimum co-pay is $3.00 for Medicaid with dental coverage. °Dental Insurance is accepted and must be presented at time of visit. °Medicare does not cover dental. °Forms of payment: Cash, credit card, checks. °Best way to get seen: °If not previously registered with the clinic, walk-in dental registration begins at 7:15 am and is on a first come/first serve basis. °If previously registered with the clinic, call to make an appointment. °  °  °[facility] °[phone] °[place] RESIDENTS ONLY °  °Location: °[address], [place], [place] °Clinic Hours: °Mon-Thu 10a-2p °Services: Extractions only! °Payment Options: °FREE (donations accepted) - bring proof of income or support °Best way to get seen: °Call and schedule an appointment OR come at 8am on the 1st Monday of every month (except for holidays) when it is first come/first served. °  °  °[facility] °[phone] °  °Location: °[address], [place] °Clinic Hours: °Friday mornings °Services, Payment Options, Best way to get seen: °Call for info °

## 2019-05-06 DIAGNOSIS — J4520 Mild intermittent asthma, uncomplicated: Secondary | ICD-10-CM | POA: Insufficient documentation

## 2019-05-17 ENCOUNTER — Other Ambulatory Visit: Payer: Self-pay

## 2019-05-17 ENCOUNTER — Emergency Department: Payer: Medicaid Other

## 2019-05-17 DIAGNOSIS — Z20828 Contact with and (suspected) exposure to other viral communicable diseases: Secondary | ICD-10-CM | POA: Diagnosis not present

## 2019-05-17 DIAGNOSIS — Z79899 Other long term (current) drug therapy: Secondary | ICD-10-CM | POA: Insufficient documentation

## 2019-05-17 DIAGNOSIS — F17210 Nicotine dependence, cigarettes, uncomplicated: Secondary | ICD-10-CM | POA: Insufficient documentation

## 2019-05-17 DIAGNOSIS — I10 Essential (primary) hypertension: Secondary | ICD-10-CM | POA: Insufficient documentation

## 2019-05-17 DIAGNOSIS — R05 Cough: Secondary | ICD-10-CM | POA: Insufficient documentation

## 2019-05-17 DIAGNOSIS — J069 Acute upper respiratory infection, unspecified: Secondary | ICD-10-CM | POA: Diagnosis not present

## 2019-05-17 DIAGNOSIS — R0789 Other chest pain: Secondary | ICD-10-CM | POA: Diagnosis present

## 2019-05-17 NOTE — ED Triage Notes (Signed)
Patient c/o chest pain, productive cough X 3 days. Patient reports pain is worse with inspiration and when lying flat.

## 2019-05-18 ENCOUNTER — Emergency Department
Admission: EM | Admit: 2019-05-18 | Discharge: 2019-05-18 | Disposition: A | Discharge status: Home / Self Care | Payer: Medicaid Other | Attending: Emergency Medicine | Admitting: Emergency Medicine

## 2019-05-18 ENCOUNTER — Emergency Department: Payer: Medicaid Other

## 2019-05-18 DIAGNOSIS — Z20822 Contact with and (suspected) exposure to covid-19: Secondary | ICD-10-CM

## 2019-05-18 DIAGNOSIS — J069 Acute upper respiratory infection, unspecified: Secondary | ICD-10-CM

## 2019-05-18 LAB — BASIC METABOLIC PANEL
Anion gap: 7 (ref 5–15)
BUN: 13 mg/dL (ref 6–20)
CO2: 30 mmol/L (ref 22–32)
Calcium: 9.1 mg/dL (ref 8.9–10.3)
Chloride: 103 mmol/L (ref 98–111)
Creatinine, Ser: 0.95 mg/dL (ref 0.61–1.24)
GFR calc Af Amer: >60 mL/min (ref >60)
GFR calc non Af Amer: >60 mL/min (ref >60)
Glucose, Bld: 122 mg/dL — ABNORMAL HIGH (ref 70–99)
Potassium: 4.2 mmol/L (ref 3.5–5.1)
Sodium: 140 mmol/L (ref 135–145)

## 2019-05-18 LAB — SARS CORONAVIRUS 2 (TAT 6-24 HRS): SARS Coronavirus 2: NEGATIVE

## 2019-05-18 LAB — CBC
HCT: 51.2 % (ref 39.0–52.0)
Hemoglobin: 17 g/dL (ref 13.0–17.0)
MCH: 30.2 pg (ref 26.0–34.0)
MCHC: 33.2 g/dL (ref 30.0–36.0)
MCV: 90.9 fL (ref 80.0–100.0)
Platelets: 258 10*3/uL (ref 150–400)
RBC: 5.63 MIL/uL (ref 4.22–5.81)
RDW: 13.2 % (ref 11.5–15.5)
WBC: 12.1 10*3/uL — ABNORMAL HIGH (ref 4.0–10.5)
nRBC: 0 % (ref 0.0–0.2)

## 2019-05-18 LAB — TROPONIN I (HIGH SENSITIVITY): Troponin I (High Sensitivity): 4 ng/L (ref <18)

## 2019-05-18 MED ORDER — HYDROCODONE-HOMATROPINE 5-1.5 MG/5ML PO SYRP: 5.0000 mL | ORAL_SOLUTION | Freq: Four times a day (QID) | ORAL | 0 refills | Status: DC | PRN

## 2019-05-18 MED ORDER — DEXAMETHASONE 10 MG/ML FOR PEDIATRIC ORAL USE
10.0000 mg | Freq: Once | INTRAMUSCULAR | Status: AC
  Administered 2019-05-18: 02:00:00 10 mg via ORAL
  Filled 2019-05-18: qty 1

## 2019-05-18 NOTE — ED Notes (Signed)
ED Provider at bedside. 

## 2019-05-18 NOTE — Discharge Instructions (Addendum)
As we discussed, we believe your symptoms are caused by a respiratory virus.  However, because we cannot rule out the possibility of COVID-19 at this time, we recommend that you self-quarantine at home for 14 days, or until 3 consecutive days without fever (without taking medication to make your temperature come down, such as Tylenol (acetaminophen), after your respiratory symptoms have improved, and after at least 7 days have passed since your symptoms first appeared.  You should have as minimal contact as possible with anyone else including close family as per the CDC paperwork guidelines listed below. Follow-up with your doctor by phone or online as needed and return immediately to the emergency department or call 911 only if you develop new or worsening symptoms that concern you. ° °Please read through the included information in this paperwork for information about how to set up a MyChart account so that you can log in over the next couple of days for your test results (including COVID-19 swab results if one was obtained during your Emergency Department visit). ° °You can find up-to-date information about COVID-19 in [place] by calling the [facility] [place] Coronavirus Helpline: 1-866-462-3821. You may also call 2-1-1, or [phone], or additional resources.  You can also find information online at https://www.ncdhhs.gov/divisions/public-health/coronavirus-disease-2019-covid-19-response-[place], or on the Center for Disease Control (CDC) website at https://www.cdc.gov/coronavirus/2019-ncov/index.html. ° °

## 2019-05-18 NOTE — ED Provider Notes (Signed)
[facility] Emergency Department Provider Note  ____________________________________________   First MD Initiated Contact with Patient 05/18/19 0125     (approximate)  I have reviewed the triage vital signs and the nursing notes.   HISTORY  Chief Complaint Chest Pain and Cough    HPI Jeremy Yoder is a 38 y.o. male with medical history as listed below who presents for evaluation of several days of cough that is occasionally productive of sputum, shortness of breath that is worse with exertion, and some chest discomfort particularly when he is short of breath and has a cough.  He denies fever/chills, sore throat, nausea, vomiting, abdominal pain, and dysuria.  He has had some general malaise and fatigue.  No known contact with COVID-19 patients.  The symptoms are anywhere from mild to severe but currently are essentially not present.  He has no trouble walking around and doing his daily activities.  He said the symptoms feel just like when he was previously diagnosed with bronchitis.  He continues to smoke daily and has been previously diagnosed with asthma.  He also came in tonight with his stepson who has some upper respiratory viral symptoms as well.         Past Medical History:  Diagnosis Date  . A-fib (HCC)   . Hypertension    a. Noncompliant w/ meds.  . Seizures (HCC)    a. Dx in teens. Noncompliant w/ meds.  . Tobacco abuse     Patient Active Problem List   Diagnosis Date Noted  . Dizziness 12/26/2018  . Paroxysmal atrial fibrillation (HCC) 01/03/2018  . Essential hypertension 01/03/2018  . Seizure disorder (HCC) 01/03/2018    Past Surgical History:  Procedure Laterality Date  . arm    . EYE SURGERY    . FOREARM SURGERY    . KNEE SURGERY      Prior to Admission medications   Medication Sig Start Date End Date Taking? Authorizing Provider  acetaminophen (TYLENOL) 500 MG tablet Take 1 tablet (500 mg total) by mouth every 6 (six)  hours as needed. 07/17/18   Enid Derry, PA-C  amLODipine (NORVASC) 5 MG tablet Take 1 tablet (5 mg total) by mouth daily. 03/21/19 03/20/20  Willy Eddy, MD  diazepam (VALIUM) 5 MG tablet Take 1 tablet (5 mg total) by mouth every 8 (eight) hours as needed for anxiety. 01/23/19   Irean Hong, MD  dicyclomine (BENTYL) 10 MG capsule Take 1 capsule (10 mg total) by mouth 3 (three) times daily as needed for up to 14 days for spasms. or abdominal pain 08/17/18 08/31/18  Loleta Rose, MD  HYDROcodone-homatropine [facility]) 5-1.5 MG/5ML syrup Take 5 mLs by mouth every 6 (six) hours as needed for cough. 05/18/19   Loleta Rose, MD  levETIRAcetam (KEPPRA) 500 MG tablet Take 1 tablet (500 mg total) by mouth 2 (two) times daily. 03/20/18   Loleta Rose, MD  lidocaine (LIDODERM) 5 % Place 1 patch onto the skin daily. Remove & Discard patch within 12 hours or as directed by MD 01/23/19   Irean Hong, MD  methocarbamol (ROBAXIN) 500 MG tablet Take 1 tablet (500 mg total) by mouth 4 (four) times daily. 01/07/19   Cuthriell, Delorise Royals, PA-C  metoprolol succinate (TOPROL-XL) 50 MG 24 hr tablet Take 1 tablet (50 mg total) by mouth daily. Take with or immediately following a meal. 12/26/18   Gollan, Tollie Pizza, MD  predniSONE (DELTASONE) 50 MG tablet Take 1 tablet (50 mg total)  by mouth daily with breakfast. 01/07/19   Cuthriell, Delorise RoyalsJonathan D, PA-C    Allergies Aspirin and Ibuprofen  Family History  Problem Relation Age of Onset  . Hypertension Mother   . Diabetes Mother   . Atrial fibrillation Mother   . Heart disease Father        Pt doesn't know much about father's history but says he has either a PPM or ICD.    Social History Social History   Tobacco Use  . Smoking status: Current Every Day Smoker    Packs/day: 0.50    Years: 20.00    Pack years: 10.00  . Smokeless tobacco: Never Used  . Tobacco comment: currently smoking 0.5 - 1 ppd.  Substance Use Topics  . Alcohol use: No  . Drug use: No     Review of Systems Constitutional: No fever/chills.  General malaise and fatigue. Eyes: No visual changes. ENT: No sore throat. Cardiovascular: Chest discomfort when coughing and short of breath Respiratory: Cough with some shortness of breath with exertion. Gastrointestinal: No abdominal pain.  No nausea, no vomiting.  No diarrhea.  No constipation. Genitourinary: Negative for dysuria. Musculoskeletal: Negative for neck pain.  Negative for back pain. Integumentary: Negative for rash. Neurological: Negative for headaches, focal weakness or numbness.   ____________________________________________   PHYSICAL EXAM:  VITAL SIGNS: ED Triage Vitals  Enc Vitals Group     BP 05/17/19 2317 (!) 155/109     Pulse Rate 05/17/19 2317 93     Resp 05/17/19 2317 18     Temp 05/17/19 2317 98.4 F (36.9 C)     Temp src --      SpO2 05/17/19 2317 98 %     Weight 05/17/19 2318 84.4 kg (186 lb)     Height 05/17/19 2318 1.829 m (6')     Head Circumference --      Peak Flow --      Pain Score 05/17/19 2317 8     Pain Loc --      Pain Edu? --      Excl. in GC? --     Constitutional: Alert and oriented.  No acute distress and generally well-appearing. Eyes: Conjunctivae are normal.  Head: Atraumatic. Nose: No congestion/rhinnorhea. Mouth/Throat: Mucous membranes are moist. Neck: No stridor.  No meningeal signs.   Cardiovascular: Normal rate, regular rhythm. Good peripheral circulation. Grossly normal heart sounds. Respiratory: Normal respiratory effort.  No retractions. Gastrointestinal: Soft and nontender. No distention.  Musculoskeletal: No lower extremity tenderness nor edema. No gross deformities of extremities. Neurologic:  Normal speech and language. No gross focal neurologic deficits are appreciated.  Skin:  Skin is warm, dry and intact. Psychiatric: Mood and affect are normal. Speech and behavior are normal.  ____________________________________________   LABS (all labs ordered  are listed, but only abnormal results are displayed)  Labs Reviewed  BASIC METABOLIC PANEL - Abnormal; Notable for the following components:      Result Value   Glucose, Bld 122 (*)    All other components within normal limits  CBC - Abnormal; Notable for the following components:   WBC 12.1 (*)    All other components within normal limits  SARS CORONAVIRUS 2 (TAT 6-24 HRS)  TROPONIN I (HIGH SENSITIVITY)  TROPONIN I (HIGH SENSITIVITY)   ____________________________________________  EKG  ED ECG REPORT I, Loleta Roseory Dezi Schaner, the attending physician, personally viewed and interpreted this ECG.  Date: 05/17/2019 EKG Time: 23: 14 Rate: 91 Rhythm: normal sinus rhythm QRS Axis: normal  Intervals: normal ST/T Wave abnormalities: normal Narrative Interpretation: no evidence of acute ischemia  ____________________________________________  RADIOLOGY I, Hinda Kehr, personally viewed and evaluated these images (plain radiographs) as part of my medical decision making, as well as reviewing the written report by the radiologist.  ED MD interpretation: No acute abnormalities identified on chest x-rays  Official radiology report(s): Dg Chest 2 View  Result Date: 05/18/2019 CLINICAL DATA:  Chest pain and cough for 3 days EXAM: CHEST - 2 VIEW COMPARISON:  01/03/2018 FINDINGS: The heart size and mediastinal contours are within normal limits. Both lungs are clear. The visualized skeletal structures are unremarkable. IMPRESSION: No active cardiopulmonary disease. Electronically Signed   By: Ulyses Jarred M.D.   On: 05/18/2019 00:46    ____________________________________________   PROCEDURES   Procedure(s) performed (including Critical Care):  Procedures   ____________________________________________   INITIAL IMPRESSION / MDM / [place] / ED COURSE  As part of my medical decision making, I reviewed the following data within the [place] notes  reviewed and incorporated, Labs reviewed , EKG interpreted , Old chart reviewed, Radiograph reviewed , Notes from prior ED visits and  Controlled Substance Database   Differential diagnosis includes, but is not limited to, nonspecific viral illness, bronchitis, COVID-19, pneumonia, less likely ACS or PE.  Patient is well-appearing in no distress with stable vital signs.  He is a smoker and likely has a mild bronchitis from a nonspecific viral illness.  However given the current pandemic I recommended testing him for COVID-19.  However there is no indication for admission at this time.  His chest x-ray is clear, his EKG is nonischemic, and his lab work is essentially within normal limits except for very mild leukocytosis of 12.  He has an albuterol inhaler at home and there is no indication for antibiotics at this time but I am giving him a prescription for cough syrup (I observed his cough which does sound very thick and uncomfortable).  I also gave him a one-time dose of dexamethasone which I think will help if he has some mild COPD or asthma as well as potentially helpful if he is positive COVID-19.  I gave him my usual customary "suspecting COVID-19" recommendations and return precautions and he understands and agrees with the plan.  I am testing his stepson as well.          ____________________________________________  FINAL CLINICAL IMPRESSION(S) / ED DIAGNOSES  Final diagnoses:  Viral URI with cough  Suspected COVID-19 virus infection     MEDICATIONS GIVEN DURING THIS VISIT:  Medications  dexamethasone (DECADRON) 10 MG/ML injection for Pediatric ORAL use 10 mg (10 mg Oral Given 05/18/19 0220)     ED Discharge Orders         Ordered    HYDROcodone-homatropine (HYCODAN) 5-1.5 MG/5ML syrup  Every 6 hours PRN     05/18/19 0204          *Please note:  SIRRON FRANCESCONI was evaluated in Emergency Department on 05/18/2019 for the symptoms described in the history of present illness.  He was evaluated in the context of the global COVID-19 pandemic, which necessitated consideration that the patient might be at risk for infection with the SARS-CoV-2 virus that causes COVID-19. Institutional protocols and algorithms that pertain to the evaluation of patients at risk for COVID-19 are in a state of rapid change based on information released by regulatory bodies including the CDC and federal and state organizations. These policies and  algorithms were followed during the patient's care in the ED.  Some ED evaluations and interventions may be delayed as a result of limited staffing during the pandemic.*  Note:  This document was prepared using Dragon voice recognition software and may include unintentional dictation errors.   Loleta Rose, MD 05/18/19 [id number]

## 2019-08-23 ENCOUNTER — Emergency Department
Admission: EM | Admit: 2019-08-23 | Discharge: 2019-08-23 | Disposition: A | Discharge status: Home / Self Care | Payer: Medicaid Other | Attending: Student | Admitting: Student

## 2019-08-23 ENCOUNTER — Emergency Department: Payer: Medicaid Other

## 2019-08-23 ENCOUNTER — Other Ambulatory Visit: Payer: Self-pay

## 2019-08-23 DIAGNOSIS — F17200 Nicotine dependence, unspecified, uncomplicated: Secondary | ICD-10-CM | POA: Insufficient documentation

## 2019-08-23 DIAGNOSIS — M25462 Effusion, left knee: Secondary | ICD-10-CM | POA: Diagnosis not present

## 2019-08-23 DIAGNOSIS — Z79899 Other long term (current) drug therapy: Secondary | ICD-10-CM | POA: Insufficient documentation

## 2019-08-23 DIAGNOSIS — I10 Essential (primary) hypertension: Secondary | ICD-10-CM | POA: Insufficient documentation

## 2019-08-23 DIAGNOSIS — G8929 Other chronic pain: Secondary | ICD-10-CM | POA: Diagnosis not present

## 2019-08-23 DIAGNOSIS — M25562 Pain in left knee: Secondary | ICD-10-CM | POA: Diagnosis present

## 2019-08-23 MED ORDER — PREDNISONE 10 MG PO TABS: 10.0000 mg | ORAL_TABLET | Freq: Every day | ORAL | 0 refills | Status: DC

## 2019-08-23 MED ORDER — PREDNISONE 20 MG PO TABS
60.0000 mg | ORAL_TABLET | Freq: Once | ORAL | Status: AC
  Administered 2019-08-23: 23:00:00 60 mg via ORAL
  Filled 2019-08-23: qty 3

## 2019-08-23 NOTE — ED Notes (Signed)
Was going to put pt in room 43 beside his nephew. He insists on being in the same room with his nephew so placed with him.

## 2019-08-23 NOTE — Discharge Instructions (Addendum)
Please rest ice and elevate the left knee.  Use crutches as needed for ambulation.  Take prednisone up as prescribed.  Call orthopedic office Monday morning to schedule follow-up appoint.

## 2019-08-23 NOTE — ED Triage Notes (Signed)
Patient reports left knee pain for several days.  "I don't know what i've done to it."

## 2019-08-23 NOTE — ED Notes (Signed)
Pt ambulated to room, talking in full sentences in nad with slight limp 

## 2019-08-23 NOTE — ED Provider Notes (Signed)
[place] EMERGENCY DEPARTMENT Provider Note   CSN: [id number] Arrival date & time: 08/23/19  2153     History Chief Complaint  Patient presents with  . Knee Pain    Jeremy Yoder is a 39 y.o. male.  Presents to the emergency department for evaluation of left knee pain.  Has a history of knee arthroscopy years ago.  States he has had persistent left knee pain and intermittent swelling.  Over the last few days had some swelling and is here today to address this.  He denies any new trauma or injury.  No warmth redness.  No fevers.  Pain is moderate.  His knee will occasionally give way.  He is allergic to ibuprofen.  HPI     Past Medical History:  Diagnosis Date  . A-fib ([place])   . Hypertension    a. Noncompliant w/ meds.  . Seizures ([place])    a. Dx in teens. Noncompliant w/ meds.  . Tobacco abuse     Patient Active Problem List   Diagnosis Date Noted  . Dizziness 12/26/2018  . Paroxysmal atrial fibrillation ([place]) 01/03/2018  . Essential hypertension 01/03/2018  . Seizure disorder ([place]) 01/03/2018    Past Surgical History:  Procedure Laterality Date  . arm    . EYE SURGERY    . FOREARM SURGERY    . KNEE SURGERY         Family History  Problem Relation Age of Onset  . Hypertension Mother   . Diabetes Mother   . Atrial fibrillation Mother   . Heart disease Father        Pt doesn't know much about father's history but says he has either a PPM or ICD.    Social History   Tobacco Use  . Smoking status: Current Every Day Smoker    Packs/day: 0.50    Years: 20.00    Pack years: 10.00  . Smokeless tobacco: Never Used  . Tobacco comment: currently smoking 0.5 - 1 ppd.  Substance Use Topics  . Alcohol use: No  . Drug use: No    Home Medications Prior to Admission medications   Medication Sig Start Date End Date Taking? Authorizing Provider  acetaminophen (TYLENOL) 500 MG tablet Take 1 tablet (500 mg total) by mouth every 6 (six)  hours as needed. 07/17/18   Laban Emperor, PA-C  amLODipine (NORVASC) 5 MG tablet Take 1 tablet (5 mg total) by mouth daily. 03/21/19 03/20/20  Merlyn Lot, MD  diazepam (VALIUM) 5 MG tablet Take 1 tablet (5 mg total) by mouth every 8 (eight) hours as needed for anxiety. 01/23/19   Paulette Blanch, MD  dicyclomine (BENTYL) 10 MG capsule Take 1 capsule (10 mg total) by mouth 3 (three) times daily as needed for up to 14 days for spasms. or abdominal pain 08/17/18 08/31/18  Hinda Kehr, MD  HYDROcodone-homatropine [facility]) 5-1.5 MG/5ML syrup Take 5 mLs by mouth every 6 (six) hours as needed for cough. 05/18/19   Hinda Kehr, MD  levETIRAcetam (KEPPRA) 500 MG tablet Take 1 tablet (500 mg total) by mouth 2 (two) times daily. 03/20/18   Hinda Kehr, MD  lidocaine (LIDODERM) 5 % Place 1 patch onto the skin daily. Remove & Discard patch within 12 hours or as directed by MD 01/23/19   Paulette Blanch, MD  methocarbamol (ROBAXIN) 500 MG tablet Take 1 tablet (500 mg total) by mouth 4 (four) times daily. 01/07/19   Cuthriell, Charline Bills, PA-C  metoprolol  succinate (TOPROL-XL) 50 MG 24 hr tablet Take 1 tablet (50 mg total) by mouth daily. Take with or immediately following a meal. 12/26/18   Gollan, Tollie Pizza, MD  predniSONE (DELTASONE) 10 MG tablet Take 1 tablet (10 mg total) by mouth daily. 6,5,4,3,2,1 six day taper 08/23/19   Evon Slack, PA-C    Allergies    Aspirin and Ibuprofen  Review of Systems   Review of Systems  Constitutional: Negative for fever.  Respiratory: Negative for shortness of breath.   Cardiovascular: Negative for chest pain.  Musculoskeletal: Positive for arthralgias and joint swelling. Negative for gait problem, myalgias and neck pain.    Physical Exam Updated Vital Signs BP (!) 140/97 (BP Location: Left Arm)   Pulse 88   Temp 98.6 F (37 C) (Oral)   Resp 17   Ht (!) 6" (0.152 m)   Wt 85.7 kg   SpO2 97%   BMI 3691.15 kg/m   Physical Exam Constitutional:      Appearance:  He is well-developed.  HENT:     Head: Normocephalic and atraumatic.  Eyes:     Conjunctiva/sclera: Conjunctivae normal.  Cardiovascular:     Rate and Rhythm: Normal rate.  Pulmonary:     Effort: Pulmonary effort is normal. No respiratory distress.  Musculoskeletal:        General: Normal range of motion.     Cervical back: Normal range of motion.     Comments: Mild left knee effusion.  Patient able straight leg raise.  Knee stable to valgus varus stress testing.  Patella tracking well.  Painful McMurray's test.  Negative Homans' sign, no warmth redness or edema.  Normal hip internal X rotation.  Skin:    General: Skin is warm.     Findings: No rash.  Neurological:     Mental Status: He is alert and oriented to person, place, and time.  Psychiatric:        Behavior: Behavior normal.        Thought Content: Thought content normal.     ED Results / Procedures / Treatments   Labs (all labs ordered are listed, but only abnormal results are displayed) Labs Reviewed - No data to display  EKG None  Radiology DG Knee Complete 4 Views Left  Result Date: 08/23/2019 CLINICAL DATA:  Knee pain and swelling EXAM: LEFT KNEE - COMPLETE 4+ VIEW COMPARISON:  January 23, 2019 FINDINGS: No evidence of fracture, dislocation. No evidence of arthropathy or other focal bone abnormality. Soft tissues are unremarkable. There is a small suprapatellar joint effusion. IMPRESSION: Small suprapatellar joint effusion. No acute bony abnormality or significant degenerative change in the knee. No evidence of joint effusion. Electronically Signed   By: Katherine Mantle M.D.   On: 08/23/2019 22:48    Procedures Procedures (including critical care time)  Medications Ordered in ED Medications  predniSONE (DELTASONE) tablet 60 mg (60 mg Oral Given 08/23/19 2316)    ED Course  I have reviewed the triage vital signs and the nursing notes.  Pertinent labs & imaging results that were available during my care of the  patient were reviewed by me and considered in my medical decision making (see chart for details).    MDM Rules/Calculators/A&P                      39 year old male with acute on chronic left knee pain.  No ligamentous laxity.  No new acute trauma or injury.  He has chronic  knee pain with mild effusion today.  Concern for posttraumatic cartilage abnormalities or internal derangement.  He is placed on prednisone taper.  X-ray showed no acute bony abnormality, mild degenerative changes in the medial compartment.  There are no signs of infection or DVT.  Patient will be given crutches, prednisone taper, will follow-up with orthopedics next week. Final Clinical Impression(s) / ED Diagnoses Final diagnoses:  Effusion of left knee  Chronic pain of left knee    Rx / DC Orders ED Discharge Orders         Ordered    predniSONE (DELTASONE) 10 MG tablet  Daily     08/23/19 2335           Evon Slack, PA-C 08/23/19 2340    Miguel Aschoff., MD 08/24/19 [id number]

## 2019-11-17 ENCOUNTER — Ambulatory Visit: Payer: Medicaid Other | Attending: Internal Medicine

## 2019-11-17 DIAGNOSIS — Z23 Encounter for immunization: Secondary | ICD-10-CM

## 2019-11-17 NOTE — Progress Notes (Signed)
   Covid-19 Vaccination Clinic  Name:  Jeremy Yoder    MRN: [id number] DOB: 1980-10-29  11/17/2019  [name] was observed post Covid-19 immunization for 15 minutes without incident. He was provided with Vaccine Information Sheet and instruction to access the V-Safe system.   [name] was instructed to call 911 with any severe reactions post vaccine: Marland Kitchen Difficulty breathing  . Swelling of face and throat  . A fast heartbeat  . A bad rash all over body  . Dizziness and weakness   Immunizations Administered    Name Date Dose VIS Date Route   Pfizer COVID-19 Vaccine 11/17/2019  1:03 PM 0.3 mL 07/25/2019 Intramuscular   Manufacturer: [employer], [employer]   Lot: [id number]   NDC: [address]-8

## 2019-12-10 ENCOUNTER — Ambulatory Visit: Payer: Medicaid Other

## 2019-12-17 ENCOUNTER — Ambulatory Visit: Payer: Medicaid Other | Attending: Internal Medicine

## 2019-12-17 DIAGNOSIS — Z23 Encounter for immunization: Secondary | ICD-10-CM

## 2019-12-17 NOTE — Progress Notes (Signed)
   Covid-19 Vaccination Clinic  Name:  Jeremy Yoder    MRN: [id number] DOB: 29-Mar-1981  12/17/2019  [name] was observed post Covid-19 immunization for 15 minutes without incident. He was provided with Vaccine Information Sheet and instruction to access the V-Safe system.   [name] was instructed to call 911 with any severe reactions post vaccine: Marland Kitchen Difficulty breathing  . Swelling of face and throat  . A fast heartbeat  . A bad rash all over body  . Dizziness and weakness   Immunizations Administered    Name Date Dose VIS Date Route   Pfizer COVID-19 Vaccine 12/17/2019 11:04 AM 0.3 mL 10/08/2018 Intramuscular   Manufacturer: [employer], [employer]   Lot: [id number]   NDC: [address]-7

## 2019-12-30 ENCOUNTER — Other Ambulatory Visit: Payer: Self-pay

## 2019-12-30 ENCOUNTER — Encounter: Payer: Self-pay | Admitting: Emergency Medicine

## 2019-12-30 DIAGNOSIS — R110 Nausea: Secondary | ICD-10-CM | POA: Diagnosis not present

## 2019-12-30 DIAGNOSIS — R519 Headache, unspecified: Secondary | ICD-10-CM | POA: Insufficient documentation

## 2019-12-30 DIAGNOSIS — Z5321 Procedure and treatment not carried out due to patient leaving prior to being seen by health care provider: Secondary | ICD-10-CM | POA: Insufficient documentation

## 2019-12-30 NOTE — ED Triage Notes (Signed)
Patient ambulatory to triage with steady gait, without difficulty or distress noted; pt reports frontal HA accomp by nausea x 2-3 days

## 2019-12-31 ENCOUNTER — Emergency Department
Admission: EM | Admit: 2019-12-31 | Discharge: 2019-12-31 | Disposition: A | Discharge status: Home / Self Care | Payer: Medicaid Other | Attending: Emergency Medicine | Admitting: Emergency Medicine

## 2019-12-31 NOTE — ED Notes (Addendum)
Pt called from lobby to be taken to exam room with no answer. Unable to locate pt.

## 2020-04-07 ENCOUNTER — Other Ambulatory Visit: Payer: Self-pay

## 2020-04-07 ENCOUNTER — Emergency Department: Payer: Medicaid Other

## 2020-04-07 DIAGNOSIS — Z5321 Procedure and treatment not carried out due to patient leaving prior to being seen by health care provider: Secondary | ICD-10-CM | POA: Insufficient documentation

## 2020-04-07 DIAGNOSIS — Z20822 Contact with and (suspected) exposure to covid-19: Secondary | ICD-10-CM | POA: Insufficient documentation

## 2020-04-07 DIAGNOSIS — R079 Chest pain, unspecified: Secondary | ICD-10-CM | POA: Insufficient documentation

## 2020-04-07 DIAGNOSIS — R05 Cough: Secondary | ICD-10-CM | POA: Diagnosis not present

## 2020-04-07 LAB — CBC
HCT: 44.1 % (ref 39.0–52.0)
Hemoglobin: 15.3 g/dL (ref 13.0–17.0)
MCH: 31.5 pg (ref 26.0–34.0)
MCHC: 34.7 g/dL (ref 30.0–36.0)
MCV: 90.7 fL (ref 80.0–100.0)
Platelets: 215 10*3/uL (ref 150–400)
RBC: 4.86 MIL/uL (ref 4.22–5.81)
RDW: 13.2 % (ref 11.5–15.5)
WBC: 10.8 10*3/uL — ABNORMAL HIGH (ref 4.0–10.5)
nRBC: 0 % (ref 0.0–0.2)

## 2020-04-07 LAB — COMPREHENSIVE METABOLIC PANEL
ALT: 24 U/L (ref 0–44)
AST: 24 U/L (ref 15–41)
Albumin: 4.2 g/dL (ref 3.5–5.0)
Alkaline Phosphatase: 59 U/L (ref 38–126)
Anion gap: 11 (ref 5–15)
BUN: 12 mg/dL (ref 6–20)
CO2: 24 mmol/L (ref 22–32)
Calcium: 8.8 mg/dL — ABNORMAL LOW (ref 8.9–10.3)
Chloride: 107 mmol/L (ref 98–111)
Creatinine, Ser: 0.96 mg/dL (ref 0.61–1.24)
GFR calc Af Amer: >60 mL/min (ref >60)
GFR calc non Af Amer: >60 mL/min (ref >60)
Glucose, Bld: 113 mg/dL — ABNORMAL HIGH (ref 70–99)
Potassium: 3.8 mmol/L (ref 3.5–5.1)
Sodium: 142 mmol/L (ref 135–145)
Total Bilirubin: 0.9 mg/dL (ref 0.3–1.2)
Total Protein: 7.3 g/dL (ref 6.5–8.1)

## 2020-04-07 LAB — TROPONIN I (HIGH SENSITIVITY): Troponin I (High Sensitivity): <2 ng/L (ref <18)

## 2020-04-07 NOTE — ED Triage Notes (Signed)
Pt in with co chest pain when he takes a deep breath, has been coughing states productive. Denies any fever or shob.

## 2020-04-08 ENCOUNTER — Emergency Department
Admission: EM | Admit: 2020-04-08 | Discharge: 2020-04-08 | Disposition: A | Discharge status: Home / Self Care | Payer: Medicaid Other | Attending: Emergency Medicine | Admitting: Emergency Medicine

## 2020-04-08 LAB — SARS CORONAVIRUS 2 BY RT PCR (HOSPITAL ORDER, PERFORMED IN ~~LOC~~ HOSPITAL LAB): SARS Coronavirus 2: NEGATIVE

## 2020-04-18 ENCOUNTER — Emergency Department: Payer: Medicaid Other

## 2020-04-18 ENCOUNTER — Emergency Department
Admission: EM | Admit: 2020-04-18 | Discharge: 2020-04-18 | Disposition: A | Discharge status: Home / Self Care | Payer: Medicaid Other | Attending: Emergency Medicine | Admitting: Emergency Medicine

## 2020-04-18 ENCOUNTER — Other Ambulatory Visit: Payer: Self-pay

## 2020-04-18 DIAGNOSIS — S60221A Contusion of right hand, initial encounter: Secondary | ICD-10-CM | POA: Insufficient documentation

## 2020-04-18 DIAGNOSIS — Z79899 Other long term (current) drug therapy: Secondary | ICD-10-CM | POA: Diagnosis not present

## 2020-04-18 DIAGNOSIS — W500XXA Accidental hit or strike by another person, initial encounter: Secondary | ICD-10-CM | POA: Insufficient documentation

## 2020-04-18 DIAGNOSIS — Y9389 Activity, other specified: Secondary | ICD-10-CM | POA: Diagnosis not present

## 2020-04-18 DIAGNOSIS — Y9289 Other specified places as the place of occurrence of the external cause: Secondary | ICD-10-CM | POA: Diagnosis not present

## 2020-04-18 DIAGNOSIS — Y999 Unspecified external cause status: Secondary | ICD-10-CM | POA: Insufficient documentation

## 2020-04-18 DIAGNOSIS — S6991XA Unspecified injury of right wrist, hand and finger(s), initial encounter: Secondary | ICD-10-CM | POA: Diagnosis present

## 2020-04-18 DIAGNOSIS — I10 Essential (primary) hypertension: Secondary | ICD-10-CM | POA: Insufficient documentation

## 2020-04-18 DIAGNOSIS — F17200 Nicotine dependence, unspecified, uncomplicated: Secondary | ICD-10-CM | POA: Insufficient documentation

## 2020-04-18 MED ORDER — HYDROCODONE-ACETAMINOPHEN 5-325 MG PO TABS: 1.0000 | ORAL_TABLET | Freq: Four times a day (QID) | ORAL | 0 refills | Status: DC | PRN

## 2020-04-18 NOTE — Discharge Instructions (Addendum)
Call make an appointment with Dr. Signa Kell who is the orthopedist on call for further evaluation of your hand injury.  Ice and elevation to help reduce swelling.  The Ace wrap is for protection.  Take pain medication only as directed every 6 hours if needed.
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
## 2020-04-30 ENCOUNTER — Other Ambulatory Visit: Payer: Self-pay

## 2020-04-30 ENCOUNTER — Emergency Department
Admission: EM | Admit: 2020-04-30 | Discharge: 2020-04-30 | Disposition: A | Discharge status: Home / Self Care | Payer: Medicaid Other | Attending: Emergency Medicine | Admitting: Emergency Medicine

## 2020-04-30 DIAGNOSIS — Z5321 Procedure and treatment not carried out due to patient leaving prior to being seen by health care provider: Secondary | ICD-10-CM | POA: Diagnosis not present

## 2020-04-30 DIAGNOSIS — T7840XA Allergy, unspecified, initial encounter: Secondary | ICD-10-CM | POA: Diagnosis not present

## 2020-04-30 DIAGNOSIS — R05 Cough: Secondary | ICD-10-CM | POA: Insufficient documentation

## 2020-04-30 DIAGNOSIS — R0981 Nasal congestion: Secondary | ICD-10-CM | POA: Diagnosis not present

## 2020-04-30 NOTE — ED Notes (Signed)
Called times 3   No answer in lobby  

## 2020-04-30 NOTE — ED Triage Notes (Signed)
Patient to ED for seasonal allergy symptoms. Runny nose, watery eyes, and nothing he "takes is working."

## 2020-04-30 NOTE — ED Triage Notes (Signed)
Pt c/o cough, runny nose for the past 3 days, "my allergies are acting up". States "I tested negative for covid a couple weeks ago".Marland Kitchen

## 2020-06-06 IMAGING — MR MRI HEAD WITHOUT AND WITH CONTRAST
11 of 14 series · 35 of 48 positions shown · IV contrast (gadavist)
Comparison: Head CT 03/01/2019, maxillofacial CT 03/15/2011

CLINICAL DATA: Seizures. Additional history: 20 year history of
seizures with no change in frequency or severity. Memory loss.

EXAM:
MRI HEAD WITHOUT AND WITH CONTRAST
TECHNIQUE: Multiplanar, multiecho pulse sequences of the brain and surrounding
structures were obtained without and with intravenous contrast. A
seizure protocol was utilized.
CONTRAST:  8 mL intravenous Gadavist.

[Series 5: T1 · sagittal · 5.0mm · 0.62mm/px · 1 of 25 slices shown (1 of 2)]
[im 1/25]
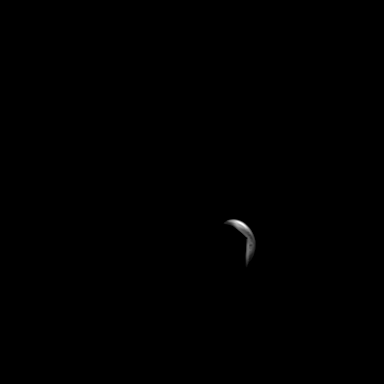

[Series 6: ax dwi_tracew · axial · 3.0mm · 0.60mm/px · z∈[-144,+15]mm · 3 of 50 slices shown]
[im 1/50]
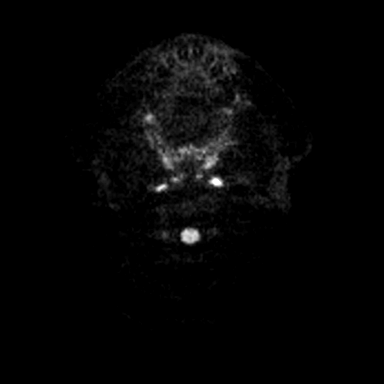
[im 25/50]
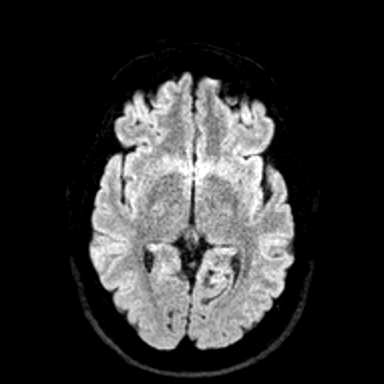
[im 50/50]
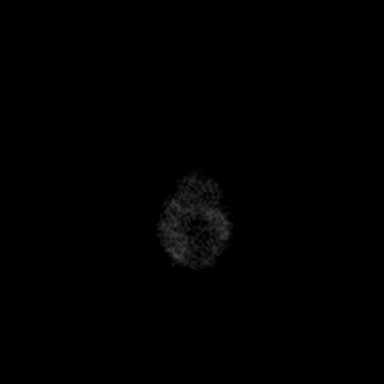

[Series 7: ax dwi_adc · axial · 3.0mm · 0.60mm/px · z∈[-144,+15]mm · 3 of 50 slices shown]
[im 1/50]
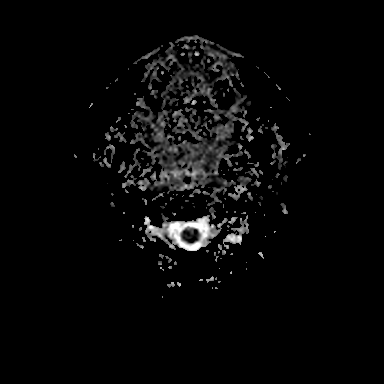
[im 25/50]
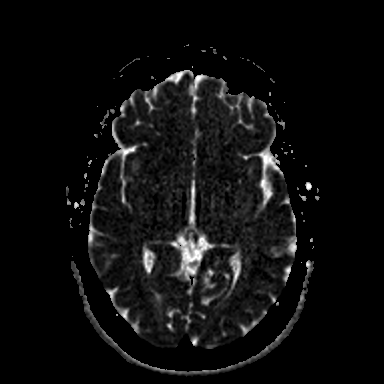
[im 50/50]
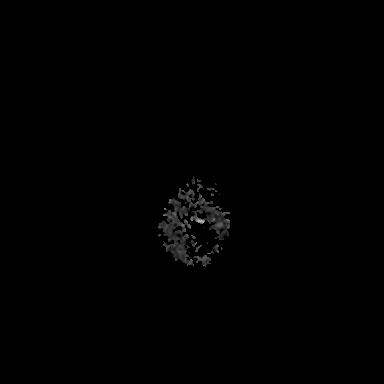

[Series 8: cor dwi_tracew · coronal · 5.0mm · 0.60mm/px · 1 of 38 slices shown]
[im 1/38]
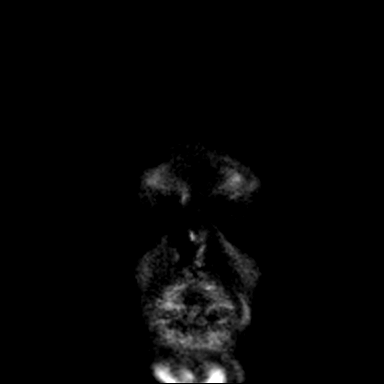

[Series 10: T2 · axial · 5.0mm · 0.53mm/px · z∈[-140,+14]mm · 2 of 27 slices shown (1 of 2)]
[im 1/27]
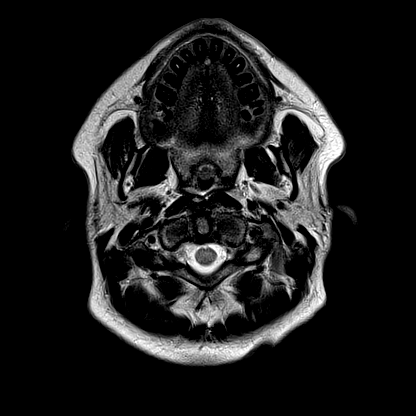
[im 27/27]
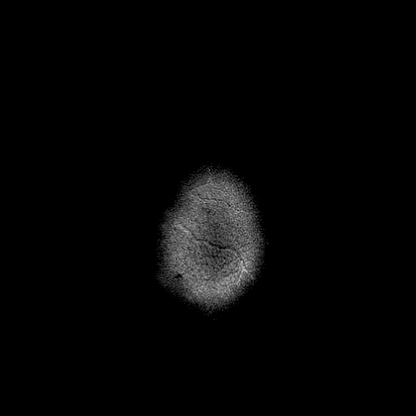

[Series 15: FLAIR · axial · 3.0mm · 0.53mm/px · z∈[-143,+17]mm · 3 of 55 slices shown (1 of 2)]
[im 1/55]
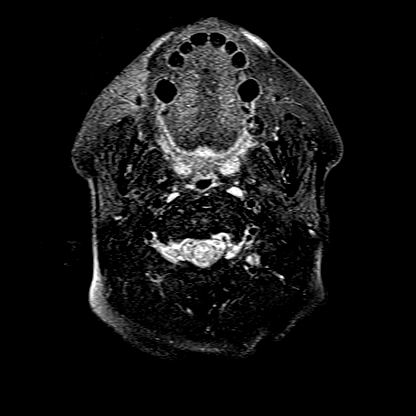
[im 28/55]
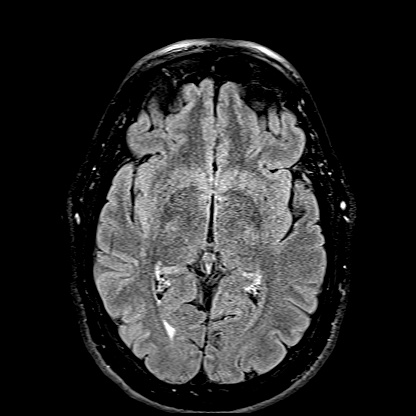
[im 55/55]
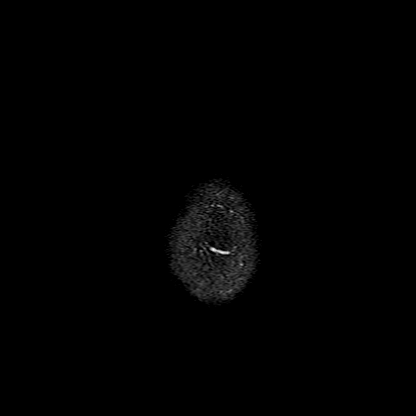

[Series 16: T1 · axial · 1.0mm · 0.98mm/px · z∈[-151,+21]mm · 8 of 176 slices shown (2 of 2)]
[im 1/176]
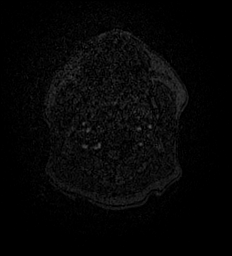
[im 20/176]
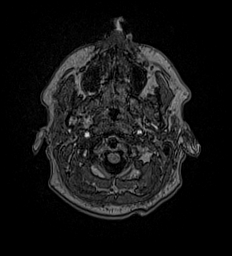
[im 59/176]
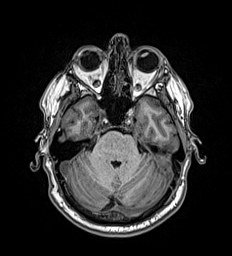
[im 78/176]
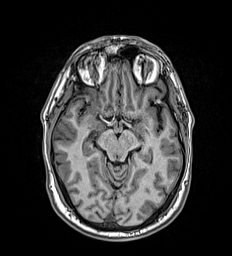
[im 98/176]
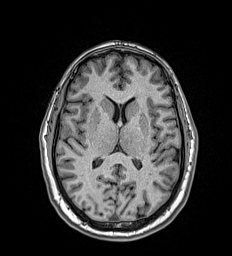
[im 117/176]
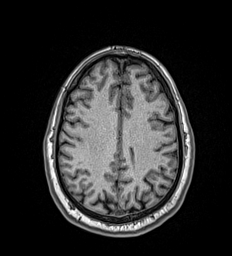
[im 156/176]
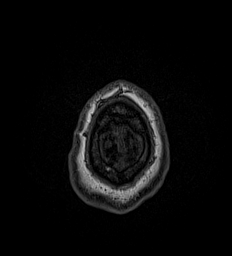
[im 176/176]
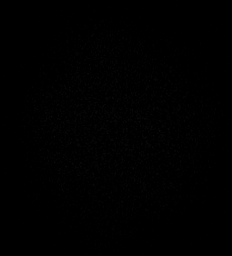

[Series 18: FLAIR · coronal · 3.0mm · 0.35mm/px · 2 of 35 slices shown (2 of 2)]
[im 1/35]
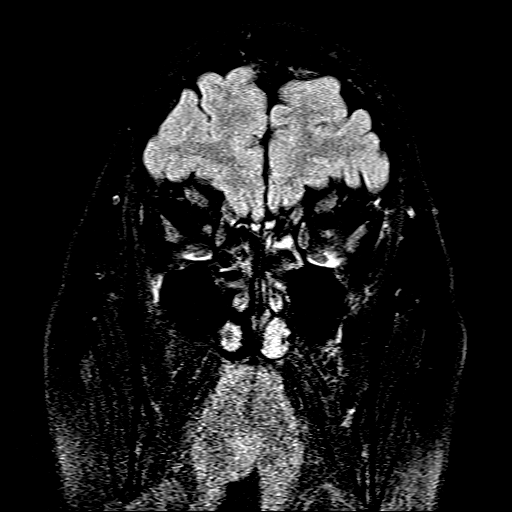
[im 35/35]
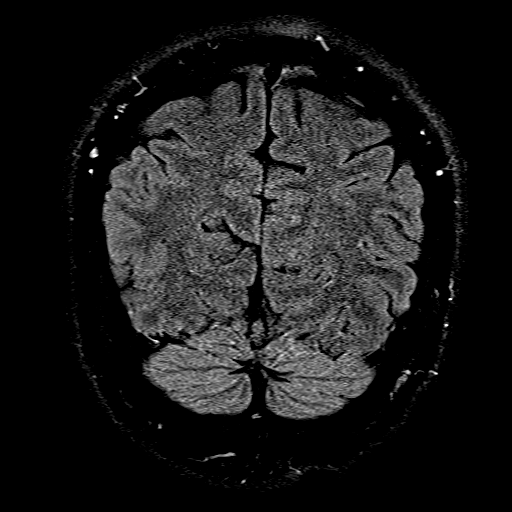

[Series 19: T2 · coronal · 3.0mm · 0.23mm/px · 2 of 35 slices shown (2 of 2)]
[im 1/35]
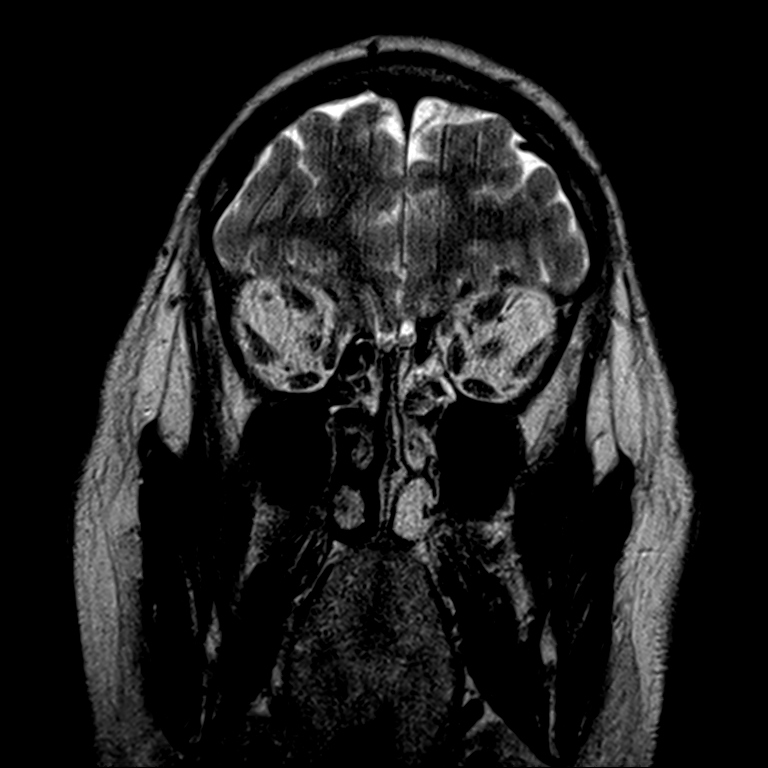
[im 35/35]
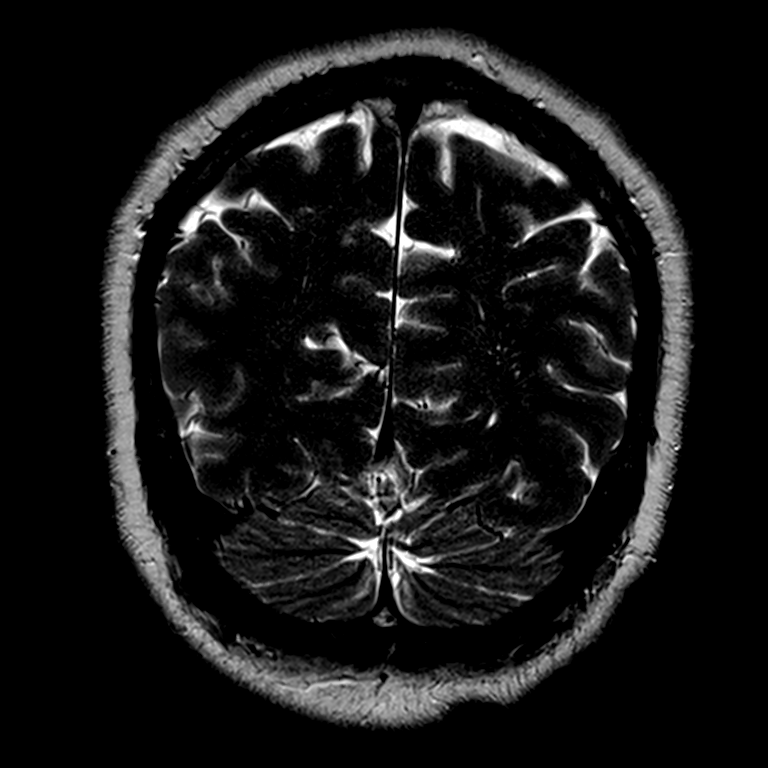

[Series 20: T1 post-contrast · axial · 1.0mm · 0.98mm/px · z∈[-151,+21]mm · 8 of 176 slices shown (1 of 2)]
[im 1/176]
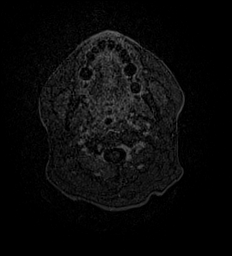
[im 20/176]
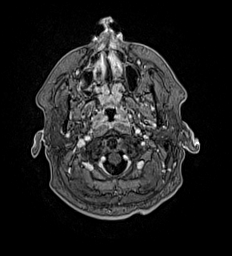
[im 59/176]
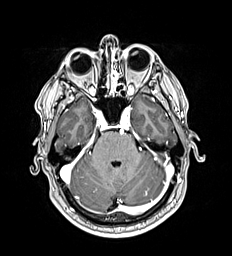
[im 78/176]
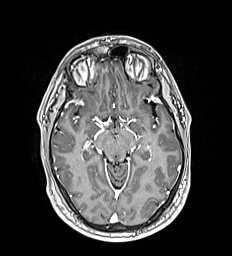
[im 98/176]
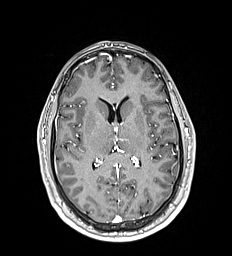
[im 117/176]
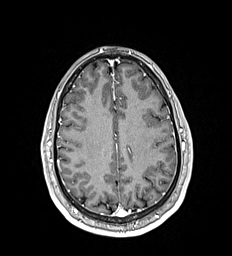
[im 156/176]
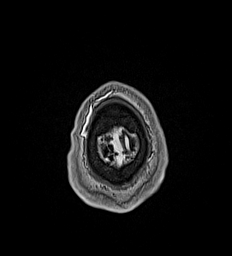
[im 176/176]
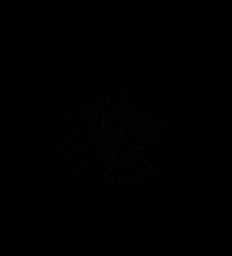

[Series 21: T1 post-contrast · coronal · 5.0mm · 0.57mm/px · 2 of 30 slices shown (2 of 2)]
[im 1/30]
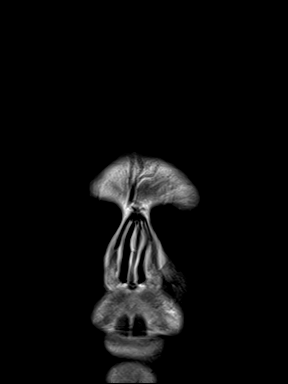
[im 30/30]
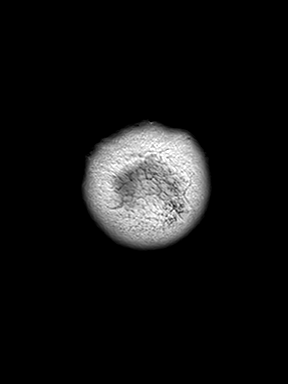

[35 of 48 positions shown; findings below may reference images not displayed]

FINDINGS: Brain: There is no acute infarct, intracranial mass, intracranial
hemorrhage, midline shift or extra-axial collection. No focal
parenchymal signal abnormality. The hippocampi are symmetric in size
and signal. Redemonstrated cerebellar tonsillar ectopia not meeting
measurement criteria for Chiari I malformation. Cerebral volume is
age appropriate. Probable small left thalamic developmental venous
anomaly. No other abnormal intracranial enhancement

Vascular: Flow voids within the proximal large vessels.

Skull and upper cervical spine: Normal marrow signal.

Sinuses/Orbits: Chronic deformity of the right orbital floor. The
globes and orbits are otherwise unremarkable. Mild paranasal sinus
mucosal thickening greatest within left ethmoid air cells. No
significant mastoid effusion.
IMPRESSION: No intracranial mass or evidence of acute intracranial abnormality.
No seizure focus is definitively identified.

Cerebral volume is age appropriate.

## 2020-09-06 ENCOUNTER — Emergency Department
Admission: EM | Admit: 2020-09-06 | Discharge: 2020-09-06 | Disposition: A | Discharge status: Home / Self Care | Payer: Medicaid Other | Attending: Emergency Medicine | Admitting: Emergency Medicine

## 2020-09-06 ENCOUNTER — Encounter: Payer: Self-pay | Admitting: *Deleted

## 2020-09-06 ENCOUNTER — Emergency Department: Payer: Medicaid Other

## 2020-09-06 DIAGNOSIS — R079 Chest pain, unspecified: Secondary | ICD-10-CM | POA: Insufficient documentation

## 2020-09-06 DIAGNOSIS — I10 Essential (primary) hypertension: Secondary | ICD-10-CM | POA: Insufficient documentation

## 2020-09-06 DIAGNOSIS — R059 Cough, unspecified: Secondary | ICD-10-CM | POA: Diagnosis not present

## 2020-09-06 DIAGNOSIS — R569 Unspecified convulsions: Secondary | ICD-10-CM | POA: Insufficient documentation

## 2020-09-06 DIAGNOSIS — Z5321 Procedure and treatment not carried out due to patient leaving prior to being seen by health care provider: Secondary | ICD-10-CM | POA: Diagnosis not present

## 2020-09-06 LAB — CBG MONITORING, ED: Glucose-Capillary: 100 mg/dL — ABNORMAL HIGH (ref 70–99)
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
## 2020-09-06 NOTE — ED Notes (Signed)
Per Dr. Scotty Court orders for labs have been placed.

## 2020-10-24 ENCOUNTER — Encounter: Payer: Self-pay | Admitting: Radiology

## 2020-10-24 ENCOUNTER — Emergency Department: Payer: Medicaid Other

## 2020-10-24 ENCOUNTER — Other Ambulatory Visit: Payer: Self-pay

## 2020-10-24 DIAGNOSIS — Z79899 Other long term (current) drug therapy: Secondary | ICD-10-CM | POA: Insufficient documentation

## 2020-10-24 DIAGNOSIS — M79671 Pain in right foot: Secondary | ICD-10-CM | POA: Insufficient documentation

## 2020-10-24 DIAGNOSIS — I10 Essential (primary) hypertension: Secondary | ICD-10-CM | POA: Diagnosis not present

## 2020-10-24 DIAGNOSIS — T1490XA Injury, unspecified, initial encounter: Secondary | ICD-10-CM

## 2020-10-24 DIAGNOSIS — F17200 Nicotine dependence, unspecified, uncomplicated: Secondary | ICD-10-CM | POA: Diagnosis not present
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
## 2020-10-25 ENCOUNTER — Emergency Department
Admission: EM | Admit: 2020-10-25 | Discharge: 2020-10-25 | Disposition: A | Discharge status: Home / Self Care | Payer: Medicaid Other | Attending: Emergency Medicine | Admitting: Emergency Medicine

## 2020-10-25 DIAGNOSIS — T1490XA Injury, unspecified, initial encounter: Secondary | ICD-10-CM

## 2020-10-25 DIAGNOSIS — M79671 Pain in right foot: Secondary | ICD-10-CM

## 2020-10-25 MED ORDER — TRAMADOL HCL 50 MG PO TABS
50.0000 mg | ORAL_TABLET | Freq: Once | ORAL | Status: AC
  Administered 2020-10-25: 50 mg via ORAL
  Filled 2020-10-25: qty 1

## 2020-10-25 MED ORDER — ACETAMINOPHEN 500 MG PO TABS
1000.0000 mg | ORAL_TABLET | Freq: Once | ORAL | Status: AC
  Administered 2020-10-25: 1000 mg via ORAL
  Filled 2020-10-25: qty 2

## 2020-10-25 NOTE — ED Provider Notes (Signed)
[facility] Emergency Department Provider Note  ____________________________________________  Time seen: Approximately 1:23 AM  I have reviewed the triage vital signs and the nursing notes.   HISTORY  Chief Complaint Foot Pain   HPI Jeremy Yoder is a 40 y.o. male presents for evaluation of right foot pain.  Patient reports that he was roller skating when the wheels of the skate came out and patient injured his right foot.  Then while getting into his car he twisted his foot again.  He is complaining of a 7 out of 10 constant pain on the dorsum of the foot has been present since the first accident.  Denies any other injuries.   Past Medical History:  Diagnosis Date  . A-fib (HCC)   . Hypertension    a. Noncompliant w/ meds.  . Seizures (HCC)    a. Dx in teens. Noncompliant w/ meds.  . Tobacco abuse     Patient Active Problem List   Diagnosis Date Noted  . Dizziness 12/26/2018  . Paroxysmal atrial fibrillation (HCC) 01/03/2018  . Essential hypertension 01/03/2018  . Seizure disorder (HCC) 01/03/2018    Past Surgical History:  Procedure Laterality Date  . arm    . EYE SURGERY    . FOREARM SURGERY    . KNEE SURGERY      Prior to Admission medications   Medication Sig Start Date End Date Taking? Authorizing Provider  acetaminophen (TYLENOL) 500 MG tablet Take 1 tablet (500 mg total) by mouth every 6 (six) hours as needed. 07/17/18   Enid Derry, PA-C  amLODipine (NORVASC) 5 MG tablet Take 1 tablet (5 mg total) by mouth daily. 03/21/19 03/20/20  Willy Eddy, MD  diazepam (VALIUM) 5 MG tablet Take 1 tablet (5 mg total) by mouth every 8 (eight) hours as needed for anxiety. 01/23/19   Irean Hong, MD  dicyclomine (BENTYL) 10 MG capsule Take 1 capsule (10 mg total) by mouth 3 (three) times daily as needed for up to 14 days for spasms. or abdominal pain 08/17/18 08/31/18  Loleta Rose, MD  HYDROcodone-acetaminophen (NORCO/VICODIN) 5-325 MG tablet  Take 1 tablet by mouth every 6 (six) hours as needed for moderate pain. 04/18/20   Tommi Rumps, PA-C  levETIRAcetam (KEPPRA) 500 MG tablet Take 1 tablet (500 mg total) by mouth 2 (two) times daily. 03/20/18   Loleta Rose, MD  lidocaine (LIDODERM) 5 % Place 1 patch onto the skin daily. Remove & Discard patch within 12 hours or as directed by MD 01/23/19   Irean Hong, MD  metoprolol succinate (TOPROL-XL) 50 MG 24 hr tablet Take 1 tablet (50 mg total) by mouth daily. Take with or immediately following a meal. 12/26/18   Gollan, Tollie Pizza, MD    Allergies Aspirin and Ibuprofen  Family History  Problem Relation Age of Onset  . Hypertension Mother   . Diabetes Mother   . Atrial fibrillation Mother   . Heart disease Father        Pt doesn't know much about father's history but says he has either a PPM or ICD.    Social History Social History   Tobacco Use  . Smoking status: Current Every Day Smoker    Packs/day: 0.50    Years: 20.00    Pack years: 10.00  . Smokeless tobacco: Never Used  . Tobacco comment: currently smoking 0.5 - 1 ppd.  Vaping Use  . Vaping Use: Never used  Substance Use Topics  . Alcohol  use: No  . Drug use: No    Review of Systems  Constitutional: Negative for fever. Eyes: Negative for visual changes. ENT: Negative for sore throat. Neck: No neck pain  Cardiovascular: Negative for chest pain. Respiratory: Negative for shortness of breath. Gastrointestinal: Negative for abdominal pain, vomiting or diarrhea. Genitourinary: Negative for dysuria. Musculoskeletal: Negative for back pain. + foot pain Skin: Negative for rash. Neurological: Negative for headaches, weakness or numbness. Psych: No SI or HI  ____________________________________________   PHYSICAL EXAM:  VITAL SIGNS: ED Triage Vitals  Enc Vitals Group     BP 10/24/20 2303 (!) 130/93     Pulse Rate 10/24/20 2303 88     Resp 10/24/20 2303 16     Temp 10/24/20 2303 98.2 F (36.8 C)      Temp Source 10/24/20 2303 Oral     SpO2 10/24/20 2303 100 %     Weight 10/24/20 2301 180 lb (81.6 kg)     Height 10/24/20 2301 5\' 11"  (1.803 m)     Head Circumference --      Peak Flow --      Pain Score 10/24/20 2301 8     Pain Loc --      Pain Edu? --      Excl. in GC? --     Constitutional: Alert and oriented. Well appearing and in no apparent distress. HEENT:      Head: Normocephalic and atraumatic.         Eyes: Conjunctivae are normal. Sclera is non-icteric.       Mouth/Throat: Mucous membranes are moist.  Cardiovascular: Regular rate and rhythm. Respiratory: Normal respiratory effort.  Musculoskeletal:  No edema, cyanosis, or erythema of extremities. Tender to palpation over the 1st metatarsal on the R with no bruising or deformities.  Neurologic: Normal speech and language. Face is symmetric. Moving all extremities. No gross focal neurologic deficits are appreciated. Skin: Skin is warm, dry and intact. No rash noted. Psychiatric: Mood and affect are normal. Speech and behavior are normal.  ____________________________________________   LABS (all labs ordered are listed, but only abnormal results are displayed)  Labs Reviewed - No data to display ____________________________________________  EKG  none  ____________________________________________  RADIOLOGY  I have personally reviewed the images performed during this visit and I agree with the Radiologist's read.   Interpretation by Radiologist:  DG Foot Complete Right  Result Date: 10/24/2020 CLINICAL DATA:  Right foot injury EXAM: RIGHT FOOT COMPLETE - 3+ VIEW COMPARISON:  None. FINDINGS: Frontal, oblique, lateral views of the right foot are obtained. No acute fracture, subluxation, or dislocation. Mild osteoarthritis of the talonavicular joint. Remaining joint spaces are well preserved. The soft tissues are normal. IMPRESSION: 1. No acute bony abnormality. 2. Mild hindfoot osteoarthritis. Electronically Signed    By: 10/26/2020 M.D.   On: 10/24/2020 23:48     ____________________________________________   PROCEDURES  Procedure(s) performed: None Procedures Critical Care performed:  None ____________________________________________   INITIAL IMPRESSION / ASSESSMENT AND PLAN / ED COURSE  40 y.o. male presents for evaluation of traumatic right foot pain.  Patient is ambulatory with no difficulties were requesting a hard soled shoe because reports that his tennis shoes are compressing over the painful site.  X-rays visualized by me with no signs of dislocation or fracture, confirmed by radiology.  Patient has no bruising, no lacerations, neurovascular exam is intact.  Will provide patient with a dose of tramadol and Tylenol and discharged home on rice therapy and  follow-up with PCP peer      _____________________________________________ Please note:  Patient was evaluated in Emergency Department today for the symptoms described in the history of present illness. Patient was evaluated in the context of the global COVID-19 pandemic, which necessitated consideration that the patient might be at risk for infection with the SARS-CoV-2 virus that causes COVID-19. Institutional protocols and algorithms that pertain to the evaluation of patients at risk for COVID-19 are in a state of rapid change based on information released by regulatory bodies including the CDC and federal and state organizations. These policies and algorithms were followed during the patient's care in the ED.  Some ED evaluations and interventions may be delayed as a result of limited staffing during the pandemic.   [place] Controlled Substance Database was reviewed by me. ____________________________________________   FINAL CLINICAL IMPRESSION(S) / ED DIAGNOSES   Final diagnoses:  Injury  Right foot pain      NEW MEDICATIONS STARTED DURING THIS VISIT:  ED Discharge Orders    None       Note:  This document was prepared  using Dragon voice recognition software and may include unintentional dictation errors.    Don Perking, [place], MD 10/25/20 [phone]

## 2020-10-25 NOTE — ED Notes (Signed)
Pt ambulatory around lobby in no acute distress.  

## 2020-12-05 ENCOUNTER — Encounter: Payer: Self-pay | Admitting: Emergency Medicine

## 2020-12-05 ENCOUNTER — Emergency Department: Payer: Medicaid Other

## 2020-12-05 ENCOUNTER — Emergency Department
Admission: EM | Admit: 2020-12-05 | Discharge: 2020-12-06 | Disposition: A | Discharge status: Home / Self Care | Payer: Medicaid Other | Attending: Emergency Medicine | Admitting: Emergency Medicine

## 2020-12-05 ENCOUNTER — Other Ambulatory Visit: Payer: Self-pay

## 2020-12-05 DIAGNOSIS — I10 Essential (primary) hypertension: Secondary | ICD-10-CM | POA: Insufficient documentation

## 2020-12-05 DIAGNOSIS — F17200 Nicotine dependence, unspecified, uncomplicated: Secondary | ICD-10-CM | POA: Diagnosis not present

## 2020-12-05 DIAGNOSIS — Z79899 Other long term (current) drug therapy: Secondary | ICD-10-CM | POA: Insufficient documentation

## 2020-12-05 DIAGNOSIS — R2242 Localized swelling, mass and lump, left lower limb: Secondary | ICD-10-CM | POA: Diagnosis not present

## 2020-12-05 DIAGNOSIS — M25562 Pain in left knee: Secondary | ICD-10-CM

## 2020-12-05 NOTE — ED Triage Notes (Signed)
Pt reports that his left knee is swollen, he had surgery on it in Jan of this year. Is unsure of any injury. States that it just started swelling today.

## 2020-12-06 MED ORDER — LIDOCAINE 5 % EX PTCH: 1.0000 | MEDICATED_PATCH | Freq: Two times a day (BID) | CUTANEOUS | 0 refills | Status: AC

## 2020-12-06 MED ORDER — LIDOCAINE 5 % EX PTCH
1.0000 | MEDICATED_PATCH | CUTANEOUS | Status: DC
  Administered 2020-12-06: 1 via TRANSDERMAL
  Filled 2020-12-06: qty 1

## 2020-12-06 MED ORDER — METHOCARBAMOL 750 MG PO TABS: 750.0000 mg | ORAL_TABLET | Freq: Four times a day (QID) | ORAL | 0 refills | Status: AC | PRN

## 2020-12-06 MED ORDER — METHOCARBAMOL 500 MG PO TABS
750.0000 mg | ORAL_TABLET | Freq: Once | ORAL | Status: AC
  Administered 2020-12-06: 750 mg via ORAL
  Filled 2020-12-06: qty 2

## 2020-12-06 MED ORDER — ACETAMINOPHEN 325 MG PO TABS
650.0000 mg | ORAL_TABLET | Freq: Once | ORAL | Status: AC
  Administered 2020-12-06: 650 mg via ORAL
  Filled 2020-12-06: qty 2

## 2020-12-06 NOTE — Discharge Instructions (Addendum)
Please use Tylenol with the medications prescribed. Follow up with Dr. Roney Mans at [facility]. Return to ER if any worsening.

## 2020-12-06 NOTE — ED Provider Notes (Signed)
[facility] Emergency Department Provider Note  ____________________________________________   Event Date/Time   First MD Initiated Contact with Patient 12/05/20 2208     (approximate)  I have reviewed the triage vital signs and the nursing notes.   HISTORY  Chief Complaint Knee Pain  HPI Jeremy Yoder is a 40 y.o. male who presents to the emergency department for evaluation of left knee pain and swelling.  Patient reports that he has had pain in the knee for "several months".  He reports a surgery last year approximately 1 year ago at [facility] with Dr. Roney Mans at which time he had partial meniscus debridement and chondroplasty.  He states that he initially recovered well after the surgery, however began having pain several months ago with no known injury.  He states that it began having some swelling today.  He also reports pain down into the left calf that began around the same time.  He states that he has not followed up with Dr. Roney Mans because he lost their phone number.  He denies any fever, chills or other associated symptoms.  He denies history of blood clots, however he does smoke.         Past Medical History:  Diagnosis Date  . A-fib (HCC)   . Hypertension    a. Noncompliant w/ meds.  . Seizures (HCC)    a. Dx in teens. Noncompliant w/ meds.  . Tobacco abuse     Patient Active Problem List   Diagnosis Date Noted  . Dizziness 12/26/2018  . Paroxysmal atrial fibrillation (HCC) 01/03/2018  . Essential hypertension 01/03/2018  . Seizure disorder (HCC) 01/03/2018    Past Surgical History:  Procedure Laterality Date  . arm    . EYE SURGERY    . FOREARM SURGERY    . KNEE SURGERY      Prior to Admission medications   Medication Sig Start Date End Date Taking? Authorizing Provider  lidocaine (LIDODERM) 5 % Place 1 patch onto the skin every 12 (twelve) hours for 10 days. Remove & Discard patch within 12 hours or as directed by MD 12/06/20  12/16/20 Yes Lucy Chris, PA  methocarbamol (ROBAXIN-750) 750 MG tablet Take 1 tablet (750 mg total) by mouth 4 (four) times daily as needed for up to 10 days for muscle spasms. 12/06/20 12/16/20 Yes Lucy Chris, PA  acetaminophen (TYLENOL) 500 MG tablet Take 1 tablet (500 mg total) by mouth every 6 (six) hours as needed. 07/17/18   Enid Derry, PA-C  amLODipine (NORVASC) 5 MG tablet Take 1 tablet (5 mg total) by mouth daily. 03/21/19 03/20/20  Willy Eddy, MD  diazepam (VALIUM) 5 MG tablet Take 1 tablet (5 mg total) by mouth every 8 (eight) hours as needed for anxiety. 01/23/19   Irean Hong, MD  dicyclomine (BENTYL) 10 MG capsule Take 1 capsule (10 mg total) by mouth 3 (three) times daily as needed for up to 14 days for spasms. or abdominal pain 08/17/18 08/31/18  Loleta Rose, MD  HYDROcodone-acetaminophen (NORCO/VICODIN) 5-325 MG tablet Take 1 tablet by mouth every 6 (six) hours as needed for moderate pain. 04/18/20   Tommi Rumps, PA-C  levETIRAcetam (KEPPRA) 500 MG tablet Take 1 tablet (500 mg total) by mouth 2 (two) times daily. 03/20/18   Loleta Rose, MD  metoprolol succinate (TOPROL-XL) 50 MG 24 hr tablet Take 1 tablet (50 mg total) by mouth daily. Take with or immediately following a meal. 12/26/18   Julien Nordmann  J, MD    Allergies Aspirin and Ibuprofen  Family History  Problem Relation Age of Onset  . Hypertension Mother   . Diabetes Mother   . Atrial fibrillation Mother   . Heart disease Father        Pt doesn't know much about father's history but says he has either a PPM or ICD.    Social History Social History   Tobacco Use  . Smoking status: Current Every Day Smoker    Packs/day: 0.50    Years: 20.00    Pack years: 10.00  . Smokeless tobacco: Never Used  . Tobacco comment: currently smoking 0.5 - 1 ppd.  Vaping Use  . Vaping Use: Never used  Substance Use Topics  . Alcohol use: No  . Drug use: No    Review of Systems Constitutional: No  fever/chills Eyes: No visual changes. ENT: No sore throat. Cardiovascular: Denies chest pain. Respiratory: Denies shortness of breath. Gastrointestinal: No abdominal pain.  No nausea, no vomiting.  No diarrhea.  No constipation. Genitourinary: Negative for dysuria. Musculoskeletal: + Left knee pain, + left calf pain, negative for back pain. Skin: Negative for rash. Neurological: Negative for headaches, focal weakness or numbness.  ____________________________________________   PHYSICAL EXAM:  VITAL SIGNS: ED Triage Vitals  Enc Vitals Group     BP 12/05/20 2122 (!) 155/103     Pulse Rate 12/05/20 2122 96     Resp 12/05/20 2122 17     Temp 12/05/20 2122 98 F (36.7 C)     Temp src --      SpO2 12/05/20 2122 96 %     Weight 12/05/20 2123 200 lb (90.7 kg)     Height 12/05/20 2123 6' (1.829 m)     Head Circumference --      Peak Flow --      Pain Score 12/05/20 2123 9     Pain Loc --      Pain Edu? --      Excl. in GC? --    Constitutional: Alert and oriented. Well appearing and in no acute distress. Eyes: Conjunctivae are normal. PERRL. EOMI. Head: Atraumatic. Nose: No congestion/rhinnorhea. Mouth/Throat: Mucous membranes are moist.   Neck: No stridor.   Cardiovascular: Normal rate, regular rhythm. Grossly normal heart sounds.  Good peripheral circulation. Respiratory: Normal respiratory effort.  No retractions. Lungs CTAB. Musculoskeletal: There is tenderness present about the left knee, worse along the medial joint line.  There is 1+ joint effusion.  Patient is able to perform a straight leg raise, and is able to flex the knee to approximately 100 degrees.  He is also able to completely straighten the knee without difficulty.  There is also tenderness to the posterior calf musculature, no erythema or swelling noted of the calf.  Dorsal pedal pulse 2+, posterior tibial pulse 2+. Neurologic:  Normal speech and language. No gross focal neurologic deficits are  appreciated. Skin:  Skin is warm, dry and intact. No rash noted. Psychiatric: Mood and affect are normal. Speech and behavior are normal.   ____________________________________________  RADIOLOGY I, Lucy Chris, personally viewed and evaluated these images (plain radiographs) as part of my medical decision making, as well as reviewing the written report by the radiologist.  ED provider interpretation: X-ray demonstrates some soft tissue swelling without any acute fracture or other acute anomaly, see radiology report for ultrasound findings  Official radiology report(s): US Venous Img Lower Unilateral Left  Result Date: 12/06/2020 CLINICAL DATA:  Left leg  pain EXAM: Left LOWER EXTREMITY VENOUS DOPPLER ULTRASOUND TECHNIQUE: Gray-scale sonography with compression, as well as color and duplex ultrasound, were performed to evaluate the deep venous system(s) from the level of the common femoral vein through the popliteal and proximal calf veins. COMPARISON:  None. FINDINGS: VENOUS Normal compressibility of the common femoral, superficial femoral, and popliteal veins, as well as the visualized calf veins. Visualized portions of profunda femoral vein and great saphenous vein unremarkable. No filling defects to suggest DVT on grayscale or color Doppler imaging. Doppler waveforms show normal direction of venous flow, normal respiratory plasticity and response to augmentation. Limited views of the contralateral common femoral vein are unremarkable. OTHER A 4 cm complex fluid collection is demonstrated in the soft tissues medial to the knee, possibly hematoma. Small amount of fluid is also demonstrated posterior to the quadriceps tendon without discrete evidence of tendon rupture. Limitations: none IMPRESSION: 1. No evidence of significant deep venous thrombosis in the visualized left lower extremity. 2. Complex fluid collection medial to the knee in the soft tissues, likely hematoma. 3. Small amount of  fluid or edema posterior to the quadriceps tendon. Electronically Signed   By: Burman Nieves M.D.   On: 12/06/2020 00:22   DG Knee Complete 4 Views Left  Result Date: 12/05/2020 CLINICAL DATA:  Knee pain and swelling EXAM: LEFT KNEE - COMPLETE 4+ VIEW COMPARISON:  August 23, 2019 FINDINGS: No evidence of fracture, dislocation, or joint effusion. No evidence of arthropathy or other focal bone abnormality. Soft tissue swelling about the knee. IMPRESSION: Soft tissue swelling about the knee. No acute osseous abnormality. Electronically Signed   By: Maudry Mayhew MD   On: 12/05/2020 22:58    ____________________________________________   INITIAL IMPRESSION / ASSESSMENT AND PLAN / ED COURSE  As part of my medical decision making, I reviewed the following data within the electronic MEDICAL RECORD NUMBER Nursing notes reviewed and incorporated, Radiograph reviewed and Notes from prior ED visits        Patient is a 40 year old male who presents to the emergency department for evaluation of left knee pain that is been intermittent over the last several months with swelling that began today.  He denies any fever, chills or other associated symptoms.  Denies any acute injury that he is aware of to cause symptoms.  See HPI for further details.  In triage, patient is afebrile, not tachycardic and other than mild hypertension has otherwise normal vital signs.  On physical exam, the patient does have slightly limited range of motion particularly in flexion, however he is able to perform a straight leg raise and is neurovascularly intact.  He was unable to tolerate ligamentous exam.  X-ray was obtained and does not demonstrate any acute fracture.  Ultrasound was used to rule out acute DVT given the associated calf pain.  Ultrasound did demonstrate a fluid collection on the medial aspect of the knee that was favored to be hematoma in nature as well as some soft tissue swelling or edema under the quadriceps tendon.   I will recommend that the patient follow-up with Dr. Roney Mans given the prior surgery on this knee.  He was given his contact info to schedule an appointment.  The patient is allergic to anti-inflammatories, and thus recommended Tylenol, Robaxin and lidocaine patch in the interim for pain control.  Patient is amenable with plan.  Return precautions were discussed and he stable this time for outpatient management.      ____________________________________________   FINAL CLINICAL IMPRESSION(S) /  ED DIAGNOSES  Final diagnoses:  Acute pain of left knee     ED Discharge Orders         Ordered    methocarbamol (ROBAXIN-750) 750 MG tablet  4 times daily PRN        12/06/20 0028    lidocaine (LIDODERM) 5 %  Every 12 hours        12/06/20 0028          *Please note:  Jeremy Yoder was evaluated in Emergency Department on 12/06/2020 for the symptoms described in the history of present illness. He was evaluated in the context of the global COVID-19 pandemic, which necessitated consideration that the patient might be at risk for infection with the SARS-CoV-2 virus that causes COVID-19. Institutional protocols and algorithms that pertain to the evaluation of patients at risk for COVID-19 are in a state of rapid change based on information released by regulatory bodies including the CDC and federal and state organizations. These policies and algorithms were followed during the patient's care in the ED.  Some ED evaluations and interventions may be delayed as a result of limited staffing during and the pandemic.*   Note:  This document was prepared using Dragon voice recognition software and may include unintentional dictation errors.   Lucy ChrisRodgers, Pete Schnitzer J, PA 12/06/20 [id number]    Jene EveryKinner, Robert, MD 12/06/20 [phone]

## 2021-03-11 ENCOUNTER — Emergency Department: Payer: Medicaid Other

## 2021-03-11 ENCOUNTER — Emergency Department
Admission: EM | Admit: 2021-03-11 | Discharge: 2021-03-11 | Disposition: A | Discharge status: Home / Self Care | Payer: Medicaid Other | Attending: Emergency Medicine | Admitting: Emergency Medicine

## 2021-03-11 ENCOUNTER — Encounter: Payer: Self-pay | Admitting: Emergency Medicine

## 2021-03-11 DIAGNOSIS — I10 Essential (primary) hypertension: Secondary | ICD-10-CM | POA: Diagnosis not present

## 2021-03-11 DIAGNOSIS — N23 Unspecified renal colic: Secondary | ICD-10-CM

## 2021-03-11 DIAGNOSIS — F17210 Nicotine dependence, cigarettes, uncomplicated: Secondary | ICD-10-CM | POA: Insufficient documentation

## 2021-03-11 DIAGNOSIS — K5900 Constipation, unspecified: Secondary | ICD-10-CM | POA: Diagnosis not present

## 2021-03-11 DIAGNOSIS — Z79899 Other long term (current) drug therapy: Secondary | ICD-10-CM | POA: Insufficient documentation

## 2021-03-11 DIAGNOSIS — R109 Unspecified abdominal pain: Secondary | ICD-10-CM | POA: Diagnosis present

## 2021-03-11 LAB — COMPREHENSIVE METABOLIC PANEL
ALT: 25 U/L (ref 0–44)
AST: 22 U/L (ref 15–41)
Albumin: 4.1 g/dL (ref 3.5–5.0)
Alkaline Phosphatase: 47 U/L (ref 38–126)
Anion gap: 10 (ref 5–15)
BUN: 17 mg/dL (ref 6–20)
CO2: 23 mmol/L (ref 22–32)
Calcium: 9 mg/dL (ref 8.9–10.3)
Chloride: 104 mmol/L (ref 98–111)
Creatinine, Ser: 1.08 mg/dL (ref 0.61–1.24)
GFR, Estimated: >60 mL/min (ref >60)
Glucose, Bld: 130 mg/dL — ABNORMAL HIGH (ref 70–99)
Potassium: 3.8 mmol/L (ref 3.5–5.1)
Sodium: 137 mmol/L (ref 135–145)
Total Bilirubin: 0.6 mg/dL (ref 0.3–1.2)
Total Protein: 7.3 g/dL (ref 6.5–8.1)

## 2021-03-11 LAB — CBC
HCT: 42.1 % (ref 39.0–52.0)
Hemoglobin: 15 g/dL (ref 13.0–17.0)
MCH: 33.2 pg (ref 26.0–34.0)
MCHC: 35.6 g/dL (ref 30.0–36.0)
MCV: 93.1 fL (ref 80.0–100.0)
Platelets: 224 10*3/uL (ref 150–400)
RBC: 4.52 MIL/uL (ref 4.22–5.81)
RDW: 13.2 % (ref 11.5–15.5)
WBC: 13.3 10*3/uL — ABNORMAL HIGH (ref 4.0–10.5)
nRBC: 0 % (ref 0.0–0.2)

## 2021-03-11 LAB — URINALYSIS, COMPLETE (UACMP) WITH MICROSCOPIC
Bilirubin Urine: NEGATIVE
Glucose, UA: NEGATIVE mg/dL
Ketones, ur: NEGATIVE mg/dL
Leukocytes,Ua: NEGATIVE
Nitrite: NEGATIVE
Protein, ur: 100 mg/dL — AB
RBC / HPF: >50 RBC/hpf — ABNORMAL HIGH (ref 0–5)
Specific Gravity, Urine: 1.027 (ref 1.005–1.030)
WBC, UA: >50 WBC/hpf — ABNORMAL HIGH (ref 0–5)
pH: 6 (ref 5.0–8.0)

## 2021-03-11 LAB — LIPASE, BLOOD: Lipase: 39 U/L (ref 11–51)

## 2021-03-11 MED ORDER — ONDANSETRON HCL 4 MG/2ML IJ SOLN
4.0000 mg | Freq: Once | INTRAMUSCULAR | Status: AC
  Administered 2021-03-11: 4 mg via INTRAVENOUS
  Filled 2021-03-11: qty 2

## 2021-03-11 MED ORDER — OXYCODONE-ACETAMINOPHEN 5-325 MG PO TABS: 1.0000 | ORAL_TABLET | ORAL | 0 refills | Status: DC | PRN

## 2021-03-11 MED ORDER — MORPHINE SULFATE (PF) 2 MG/ML IV SOLN
2.0000 mg | Freq: Once | INTRAVENOUS | Status: AC
Start: 2021-03-11 — End: 2021-03-11
  Administered 2021-03-11: 2 mg via INTRAVENOUS
  Filled 2021-03-11: qty 1

## 2021-03-11 MED ORDER — CEPHALEXIN 500 MG PO CAPS: 500.0000 mg | ORAL_CAPSULE | Freq: Four times a day (QID) | ORAL | 0 refills | Status: AC

## 2021-03-11 MED ORDER — SODIUM CHLORIDE 0.9 % IV BOLUS
1000.0000 mL | Freq: Once | INTRAVENOUS | Status: AC
  Administered 2021-03-11: 1000 mL via INTRAVENOUS

## 2021-03-11 MED ORDER — KETOROLAC TROMETHAMINE 30 MG/ML IJ SOLN
30.0000 mg | Freq: Once | INTRAMUSCULAR | Status: AC
  Administered 2021-03-11: 30 mg via INTRAVENOUS
  Filled 2021-03-11: qty 1

## 2021-03-11 MED ORDER — POLYETHYLENE GLYCOL 3350 17 G PO PACK: 17.0000 g | PACK | Freq: Every day | ORAL | 0 refills | Status: DC

## 2021-03-11 MED ORDER — MORPHINE SULFATE (PF) 4 MG/ML IV SOLN
4.0000 mg | Freq: Once | INTRAVENOUS | Status: AC
  Administered 2021-03-11: 4 mg via INTRAVENOUS
  Filled 2021-03-11: qty 1

## 2021-03-11 MED ORDER — SODIUM CHLORIDE 0.9 % IV SOLN
1.0000 g | Freq: Once | INTRAVENOUS | Status: AC
  Administered 2021-03-11: 1 g via INTRAVENOUS
  Filled 2021-03-11: qty 10

## 2021-03-11 MED ORDER — OXYCODONE-ACETAMINOPHEN 5-325 MG PO TABS
1.0000 | ORAL_TABLET | ORAL | Status: DC | PRN
  Administered 2021-03-11: 1 via ORAL
  Filled 2021-03-11: qty 1

## 2021-03-11 NOTE — ED Triage Notes (Signed)
Pt in w/RUQ pain that radiates to R flank. States he woke up with emesis this am, urine is also very dark. Pain is sharp, 10/10. Reports constipation x 2 days

## 2021-03-11 NOTE — ED Provider Notes (Signed)
[facility] Emergency Department Provider Note   ____________________________________________   Event Date/Time   First MD Initiated Contact with Patient 03/11/21 1016     (approximate)  I have reviewed the triage vital signs and the nursing notes.   HISTORY  Chief Complaint Abdominal Pain and Emesis   HPI Jeremy Yoder is a 40 y.o. male who reports right sided flank and abdominal pain since this morning.  His urine is also been very dark.  The pain is sharp and comes and goes almost like a cramp.  He also reports he has been constipated for 2 days.  This is a separate separate problem though.  Pain when it comes can be severe.  Right now is just a dull achy pain.        Past Medical History:  Diagnosis Date   A-fib [facility])    Hypertension    a. Noncompliant w/ meds.   Seizures (HCC)    a. Dx in teens. Noncompliant w/ meds.   Tobacco abuse     Patient Active Problem List   Diagnosis Date Noted   Dizziness 12/26/2018   Paroxysmal atrial fibrillation (HCC) 01/03/2018   Essential hypertension 01/03/2018   Seizure disorder (HCC) 01/03/2018    Past Surgical History:  Procedure Laterality Date   arm     EYE SURGERY     FOREARM SURGERY     KNEE SURGERY      Prior to Admission medications   Medication Sig Start Date End Date Taking? Authorizing Provider  cephALEXin (KEFLEX) 500 MG capsule Take 1 capsule (500 mg total) by mouth 4 (four) times daily for 10 days. 03/11/21 03/21/21 Yes Arnaldo Natal, MD  oxyCODONE-acetaminophen (PERCOCET) 5-325 MG tablet Take 1 tablet by mouth every 4 (four) hours as needed for severe pain. 03/11/21 03/11/22 Yes Arnaldo Natal, MD  polyethylene glycol (MIRALAX) 17 g packet Take 17 g by mouth daily. 03/11/21  Yes Arnaldo Natal, MD  acetaminophen (TYLENOL) 500 MG tablet Take 1 tablet (500 mg total) by mouth every 6 (six) hours as needed. 07/17/18   Enid Derry, PA-C  amLODipine (NORVASC) 5 MG tablet Take 1 tablet (5  mg total) by mouth daily. 03/21/19 03/20/20  Willy Eddy, MD  diazepam (VALIUM) 5 MG tablet Take 1 tablet (5 mg total) by mouth every 8 (eight) hours as needed for anxiety. 01/23/19   Irean Hong, MD  dicyclomine (BENTYL) 10 MG capsule Take 1 capsule (10 mg total) by mouth 3 (three) times daily as needed for up to 14 days for spasms. or abdominal pain 08/17/18 08/31/18  Loleta Rose, MD  HYDROcodone-acetaminophen (NORCO/VICODIN) 5-325 MG tablet Take 1 tablet by mouth every 6 (six) hours as needed for moderate pain. 04/18/20   Tommi Rumps, PA-C  levETIRAcetam (KEPPRA) 500 MG tablet Take 1 tablet (500 mg total) by mouth 2 (two) times daily. 03/20/18   Loleta Rose, MD  metoprolol succinate (TOPROL-XL) 50 MG 24 hr tablet Take 1 tablet (50 mg total) by mouth daily. Take with or immediately following a meal. 12/26/18   Gollan, Tollie Pizza, MD    Allergies Aspirin and Ibuprofen  Family History  Problem Relation Age of Onset   Hypertension Mother    Diabetes Mother    Atrial fibrillation Mother    Heart disease Father        Pt doesn't know much about father's history but says he has either a PPM or ICD.    Social History  Social History   Tobacco Use   Smoking status: Every Day    Packs/day: 0.50    Years: 20.00    Pack years: 10.00    Types: Cigarettes   Smokeless tobacco: Never   Tobacco comments:    currently smoking 0.5 - 1 ppd.  Vaping Use   Vaping Use: Never used  Substance Use Topics   Alcohol use: No   Drug use: No    Review of Systems  Constitutional: No fever/chills Eyes: No visual changes. ENT: No sore throat. Cardiovascular: Denies chest pain. Respiratory: Denies shortness of breath. Gastrointestinal: abdominal pain.  nausea, no vomiting.  No diarrhea. constipation. Genitourinary: Negative for dysuria. Musculoskeletal: Negative for back pain. Skin: Negative for rash. Neurological: Negative for headaches, focal  weakness  ____________________________________________   PHYSICAL EXAM:  VITAL SIGNS: ED Triage Vitals  Enc Vitals Group     BP 03/11/21 0632 (!) 159/100     Pulse Rate 03/11/21 0632 68     Resp 03/11/21 0632 18     Temp 03/11/21 0632 98.7 F (37.1 C)     Temp Source 03/11/21 0632 Oral     SpO2 03/11/21 0632 99 %     Weight 03/11/21 0632 199 lb 15.3 oz (90.7 kg)     Height --      Head Circumference --      Peak Flow --      Pain Score 03/11/21 0907 0     Pain Loc --      Pain Edu? --      Excl. in GC? --     Constitutional: Alert and oriented.  Looks uncomfortable Eyes: Conjunctivae are normal.  Head: Atraumatic. Nose: No congestion/rhinnorhea. Mouth/Throat: Mucous membranes are moist.  Oropharynx non-erythematous. Neck: No stridor.  Cardiovascular: Normal rate, regular rhythm. Grossly normal heart sounds.  Good peripheral circulation. Respiratory: Normal respiratory effort.  No retractions. Lungs CTAB. Gastrointestinal: Soft and nontender. No distention. No abdominal bruits. No CVA tenderness. usculoskeletal: No lower extremity tenderness nor edema.  No joint effusions. Neurologic:  Normal speech and language. No gross focal neurologic deficits are appreciated. No gait instability. Skin:  Skin is warm, dry and intact. No rash noted. Psychiatric: Mood and affect are normal. Speech and behavior are normal.  ____________________________________________   LABS (all labs ordered are listed, but only abnormal results are displayed)  Labs Reviewed  COMPREHENSIVE METABOLIC PANEL - Abnormal; Notable for the following components:      Result Value   Glucose, Bld 130 (*)    All other components within normal limits  CBC - Abnormal; Notable for the following components:   WBC 13.3 (*)    All other components within normal limits  URINALYSIS, COMPLETE (UACMP) WITH MICROSCOPIC - Abnormal; Notable for the following components:   Color, Urine AMBER (*)    APPearance CLOUDY  (*)    Hgb urine dipstick LARGE (*)    Protein, ur 100 (*)    RBC / HPF >50 (*)    WBC, UA >50 (*)    Bacteria, UA RARE (*)    All other components within normal limits  URINE CULTURE  LIPASE, BLOOD   ____________________________________________  EKG   ____________________________________________  RADIOLOGY Jill Poling, personally viewed and evaluated these images (plain radiographs) as part of my medical decision making, as well as reviewing the written report by the radiologist.  ED MD interpretation: CT read by radiology reviewed by me shows very small renal stone.  Official radiology  report(s): CT Renal Stone Study  Result Date: 03/11/2021 CLINICAL DATA:  Flank pain, kidney stone suspected; technologist note states flank pain with blood in urine EXAM: CT ABDOMEN AND PELVIS WITHOUT CONTRAST TECHNIQUE: Multidetector CT imaging of the abdomen and pelvis was performed following the standard protocol without IV contrast. COMPARISON:  None. FINDINGS: Lower chest: No acute abnormality. Hepatobiliary: No focal liver abnormality is seen. No gallstones, gallbladder wall thickening, or biliary dilatation. Pancreas: Unremarkable. No pancreatic ductal dilatation or surrounding inflammatory changes. Spleen: Normal in size.  Punctate calcified granuloma. Adrenals/Urinary Tract: Adrenals are unremarkable. Left kidney is unremarkable. There is mild right perinephric stranding. Right hydronephrosis is present. There is right periureteric stranding with mild dilatation of the ureter. A 1-2 mm calculus is present just above the ureterovesical junction. Stomach/Bowel: Very small hiatal hernia. Stomach is otherwise unremarkable. Bowel is normal in caliber. Normal appendix. Vascular/Lymphatic: Aortic atherosclerosis. No enlarged lymph nodes. Reproductive: Unremarkable. Other: No ascites.  Small fat containing right inguinal hernia. Musculoskeletal: Mild disc bulge at L5-S1. IMPRESSION: 1-2 mm  obstructing calculus just above the right ureterovesical junction. Mild hydroureteronephrosis. Very small hiatal hernia. Aortic atherosclerosis. Electronically Signed   By: Guadlupe Spanish M.D.   On: 03/11/2021 11:29    ____________________________________________   PROCEDURES  Procedure(s) performed (including Critical Care):  Procedures   ____________________________________________   INITIAL IMPRESSION / ASSESSMENT AND PLAN / ED COURS ----------------------------------------- 12:33 PM on 03/11/2021 ----------------------------------------- Patient still having pain but is somewhat groggy.  Do not really want to give him any more stronger pain medicine.  His O2 sats are only 93 on room air.  I will try some Toradol.  Patient has itching with aspirin and ibuprofen but only itching nothing else.  I discussed this with him and he wants to try the Toradol.  We will go ahead and do that.  If he begins itching we can give him Benadryl.     ----------------------------------------- 4:42 PM on 03/11/2021 ----------------------------------------- Patient's pain resolved after Toradol.  He may have passed a stone as well.  I will have him follow-up with urology and return if there is any further problems.  He will take antibiotics Keflex p.o. for possible UTI as well.  I discussed this with Dr. Alvester Morin who is on-call for urology.  This is plan that Dr. Alvester Morin also wants to follow-up.  She will return for any further problems like intractable vomiting pain fever etc.  He will follow-up with urology.         ____________________________________________   FINAL CLINICAL IMPRESSION(S) / ED DIAGNOSES  Final diagnoses:  Renal colic on right side     ED Discharge Orders          Ordered    cephALEXin (KEFLEX) 500 MG capsule  4 times daily        03/11/21 1326    oxyCODONE-acetaminophen (PERCOCET) 5-325 MG tablet  Every 4 hours PRN        03/11/21 1326    polyethylene glycol (MIRALAX) 17 g  packet  Daily        03/11/21 1341             Note:  This document was prepared using Dragon voice recognition software and may include unintentional dictation errors.    Arnaldo Natal, MD 03/11/21 [phone]

## 2021-03-11 NOTE — Discharge Instructions (Addendum)
You have a kidney stone.  Is very small and should pass.  Please take the Percocet 1 pill 4 times a day as needed for pain.  If the pain is bad you can try 2 pills up to 4 times a day.  Either way be careful as they can make you sleepy and woozy.  Do not drive or operate hazardous machinery on them.  Take the antibiotic Keflex 1 pill 4 times a day also.  There are a lot of white blood cells in your urine and there could be an infection associated with the kidney stone.  This happens sometimes.  If you get a fever or start vomiting or have worse pain please return.  Please also follow-up with the urologist.  Give his office a call tell him you were in the emergency room with a kidney stone and they should be out of see you in the next couple weeks.  This is important to make sure the stone passes.  Strain all your urine and try to catch stone.  Take it with you to the urologist.  Sometimes they want to have it analyzed.  For constipation you can try an over-the-counter laxative or you can use the MiraLAX which you mix according to the directions and take once a day.  For the first 2 days I would take it twice a day that should make you go fairly quickly but not explosively.  If you continue to take it for a few more days then you should become fairly regular.  If you are still gets very loose just stop the MiraLAX and resume your regular diet making sure to drink plenty of fluids and eat plenty of fiber.

## 2021-03-13 LAB — URINE CULTURE: Culture: NO GROWTH

## 2021-03-22 ENCOUNTER — Emergency Department: Payer: Medicaid Other

## 2021-03-22 ENCOUNTER — Encounter: Payer: Self-pay | Admitting: *Deleted

## 2021-03-22 ENCOUNTER — Emergency Department
Admission: EM | Admit: 2021-03-22 | Discharge: 2021-03-22 | Disposition: A | Discharge status: Home / Self Care | Payer: Medicaid Other | Attending: Emergency Medicine | Admitting: Emergency Medicine

## 2021-03-22 ENCOUNTER — Other Ambulatory Visit: Payer: Self-pay

## 2021-03-22 DIAGNOSIS — W228XXA Striking against or struck by other objects, initial encounter: Secondary | ICD-10-CM | POA: Diagnosis not present

## 2021-03-22 DIAGNOSIS — S62316A Displaced fracture of base of fifth metacarpal bone, right hand, initial encounter for closed fracture: Secondary | ICD-10-CM | POA: Diagnosis not present

## 2021-03-22 DIAGNOSIS — Z79899 Other long term (current) drug therapy: Secondary | ICD-10-CM | POA: Insufficient documentation

## 2021-03-22 DIAGNOSIS — I10 Essential (primary) hypertension: Secondary | ICD-10-CM | POA: Insufficient documentation

## 2021-03-22 DIAGNOSIS — E860 Dehydration: Secondary | ICD-10-CM | POA: Diagnosis not present

## 2021-03-22 DIAGNOSIS — R300 Dysuria: Secondary | ICD-10-CM

## 2021-03-22 DIAGNOSIS — R319 Hematuria, unspecified: Secondary | ICD-10-CM

## 2021-03-22 DIAGNOSIS — F17210 Nicotine dependence, cigarettes, uncomplicated: Secondary | ICD-10-CM | POA: Diagnosis not present

## 2021-03-22 DIAGNOSIS — R600 Localized edema: Secondary | ICD-10-CM | POA: Diagnosis not present

## 2021-03-22 DIAGNOSIS — S62306A Unspecified fracture of fifth metacarpal bone, right hand, initial encounter for closed fracture: Secondary | ICD-10-CM

## 2021-03-22 DIAGNOSIS — S6991XA Unspecified injury of right wrist, hand and finger(s), initial encounter: Secondary | ICD-10-CM | POA: Diagnosis present

## 2021-03-22 LAB — URINALYSIS, COMPLETE (UACMP) WITH MICROSCOPIC
Bacteria, UA: NONE SEEN
Bilirubin Urine: NEGATIVE
Glucose, UA: NEGATIVE mg/dL
Ketones, ur: 5 mg/dL — AB
Leukocytes,Ua: NEGATIVE
Nitrite: NEGATIVE
Protein, ur: 30 mg/dL — AB
RBC / HPF: >50 RBC/hpf — ABNORMAL HIGH (ref 0–5)
Specific Gravity, Urine: 1.031 — ABNORMAL HIGH (ref 1.005–1.030)
pH: 5 (ref 5.0–8.0)

## 2021-03-22 MED ORDER — HYDROCODONE-ACETAMINOPHEN 5-325 MG PO TABS: 2.0000 | ORAL_TABLET | Freq: Four times a day (QID) | ORAL | 0 refills | Status: DC | PRN

## 2021-03-22 MED ORDER — OXYCODONE-ACETAMINOPHEN 5-325 MG PO TABS
1.0000 | ORAL_TABLET | Freq: Once | ORAL | Status: AC
  Administered 2021-03-22: 1 via ORAL
  Filled 2021-03-22: qty 1

## 2021-03-22 MED ORDER — HYDROCODONE-ACETAMINOPHEN 5-325 MG PO TABS: 1.0000 | ORAL_TABLET | Freq: Four times a day (QID) | ORAL | 0 refills | Status: AC | PRN

## 2021-03-22 NOTE — ED Triage Notes (Signed)
Right hand pain, discolorations and swelling, unsure of specific injury.
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
## 2021-03-22 NOTE — ED Notes (Signed)
Phlebotomy attempted x 2 for BMP, unsuccessful. Pt does not wish to be stuck again. Provider aware.

## 2021-11-30 ENCOUNTER — Emergency Department: Payer: Medicaid Other

## 2021-11-30 ENCOUNTER — Other Ambulatory Visit: Payer: Self-pay

## 2021-11-30 ENCOUNTER — Emergency Department
Admission: EM | Admit: 2021-11-30 | Discharge: 2021-12-01 | Disposition: A | Discharge status: Home / Self Care | Payer: Medicaid Other | Attending: Emergency Medicine | Admitting: Emergency Medicine

## 2021-11-30 DIAGNOSIS — I10 Essential (primary) hypertension: Secondary | ICD-10-CM | POA: Insufficient documentation

## 2021-11-30 DIAGNOSIS — F17200 Nicotine dependence, unspecified, uncomplicated: Secondary | ICD-10-CM | POA: Insufficient documentation

## 2021-11-30 DIAGNOSIS — W01198A Fall on same level from slipping, tripping and stumbling with subsequent striking against other object, initial encounter: Secondary | ICD-10-CM | POA: Insufficient documentation

## 2021-11-30 DIAGNOSIS — S8002XA Contusion of left knee, initial encounter: Secondary | ICD-10-CM | POA: Diagnosis not present

## 2021-11-30 DIAGNOSIS — S8992XA Unspecified injury of left lower leg, initial encounter: Secondary | ICD-10-CM | POA: Diagnosis present

## 2021-11-30 DIAGNOSIS — Y92009 Unspecified place in unspecified non-institutional (private) residence as the place of occurrence of the external cause: Secondary | ICD-10-CM | POA: Insufficient documentation

## 2021-11-30 DIAGNOSIS — W19XXXA Unspecified fall, initial encounter: Secondary | ICD-10-CM

## 2021-11-30 NOTE — ED Triage Notes (Signed)
EMS brings pt in from home; st tripped by his dog, now c/o left knee pain ?

## 2021-11-30 NOTE — ED Triage Notes (Signed)
Pt states his dog ran out in front of him and tripped him up. Pt denies any LOC. Reports he did hit his head slightly. Unsure if he takes a blood thinner or not. Pt reports left knee pain. Limited range of motion and past Sx on the same knee.  ?

## 2021-12-01 MED ORDER — OXYCODONE-ACETAMINOPHEN 5-325 MG PO TABS
1.0000 | ORAL_TABLET | Freq: Once | ORAL | Status: AC
  Administered 2021-12-01: 1 via ORAL
  Filled 2021-12-01: qty 1

## 2021-12-01 MED ORDER — TRAMADOL HCL 50 MG PO TABS: 50.0000 mg | ORAL_TABLET | Freq: Four times a day (QID) | ORAL | 0 refills | Status: DC | PRN

## 2021-12-01 NOTE — ED Provider Notes (Signed)
? ?[facility] ?Provider Note ? ? ? Event Date/Time  ? First MD Initiated Contact with Patient 12/01/21 0004   ?  (approximate) ? ? ?History  ? ?Knee Injury, Fall, and Head Injury ? ? ?HPI ? ?Jeremy Yoder is a 41 y.o. male who presents for evaluation of left knee pain status post mechanical fall.  Patient reports he tripped on his dog and fell onto his left knee.  He is complaining of severe sharp pain and swelling.  Has been able to bear weight.  Patient has had surgery in this knee before.  He also reports lightly hitting his head as well but there is no bruising.  No LOC.  Is not on blood thinners.  No headache, no neck pain, no hip pain.  No back pain. ?  ? ? ?Past Medical History:  ?Diagnosis Date  ? A-fib (HCC)   ? Hypertension   ? a. Noncompliant w/ meds.  ? Seizures (HCC)   ? a. Dx in teens. Noncompliant w/ meds.  ? Tobacco abuse   ? ? ?Past Surgical History:  ?Procedure Laterality Date  ? arm    ? EYE SURGERY    ? FOREARM SURGERY    ? KNEE SURGERY    ? ? ? ?Physical Exam  ? ?Triage Vital Signs: ?ED Triage Vitals  ?Enc Vitals Group  ?   BP 11/30/21 2128 (!) 126/93  ?   Pulse Rate 11/30/21 2128 73  ?   Resp 11/30/21 2128 18  ?   Temp 11/30/21 2128 98.2 ?F (36.8 ?C)  ?   Temp Source 11/30/21 2128 Oral  ?   SpO2 11/30/21 2128 94 %  ?   Weight 12/01/21 0016 192 lb 9.6 oz (87.4 kg)  ?   Height 12/01/21 0016 6' (1.829 m)  ?   Head Circumference --   ?   Peak Flow --   ?   Pain Score 11/30/21 2134 10  ?   Pain Loc --   ?   Pain Edu? --   ?   Excl. in GC? --   ? ? ?Most recent vital signs: ?Vitals:  ? 11/30/21 2128 12/01/21 0017  ?BP: (!) 126/93 (!) 123/94  ?Pulse: 73 65  ?Resp: 18 18  ?Temp: 98.2 ?F (36.8 ?C)   ?SpO2: 94% 100%  ? ? ?Constitutional: Alert and oriented. No acute distress. Does not appear intoxicated. ?HEENT ?Head: Normocephalic and atraumatic. ?Face: No facial bony tenderness. Stable midface ?Ears: No hemotympanum bilaterally. No Battle sign ?Eyes: No eye injury. PERRL. No  raccoon eyes ?Nose: Nontender. No epistaxis. No rhinorrhea ?Mouth/Throat: Mucous membranes are moist. No oropharyngeal blood. No dental injury. Airway patent without stridor. Normal voice. ?Neck: no C-collar. No midline c-spine tenderness.  ?Cardiovascular: Normal rate, regular rhythm. Normal and symmetric distal pulses are present in all extremities. ?Pulmonary/Chest: Chest wall is stable and nontender to palpation/compression. Normal respiratory effort. Breath sounds are normal. No crepitus.  ?Abdominal: Soft, nontender, non distended. ?Musculoskeletal: Diffuse swelling of the left knee with decent range of motion and no obvious deformity.  Nontender with normal full range of motion in all other extremities. No deformities. No thoracic or lumbar midline spinal tenderness. Pelvis is stable. ?Skin: Skin is warm, dry and intact. No abrasions or contutions. ?Psychiatric: Speech and behavior are appropriate. ?Neurological: Normal speech and language. Moves all extremities to command. No gross focal neurologic deficits are appreciated. ? ?Glascow Coma Score: ?4 - Opens eyes on own ?6 - Follows  simple motor commands ?5 - Alert and oriented ?GCS: 15 ? ? ?ED Results / Procedures / Treatments  ? ?Labs ?(all labs ordered are listed, but only abnormal results are displayed) ?Labs Reviewed - No data to display ? ? ?EKG ? ?none ? ? ?RADIOLOGY ?I, Nita Sickle, attending MD, have personally viewed and interpreted the images obtained during this visit as below: ? ?X-ray negative for fracture or dislocation ? ? ?___________________________________________________ ?Interpretation by Radiologist:  ?DG Knee Left Port ? ?Result Date: 11/30/2021 ?CLINICAL DATA:  Larey Seat, knee injury EXAM: PORTABLE LEFT KNEE - 1-2 VIEW COMPARISON:  12/05/2020 FINDINGS: Frontal, bilateral oblique, lateral views of the left knee are obtained. No acute fracture, subluxation, or dislocation. Joint spaces are well preserved. No joint effusion. Soft tissues  are unremarkable. IMPRESSION: 1. Unremarkable left knee. Electronically Signed   By: Sharlet Salina M.D.   On: 11/30/2021 22:16   ? ? ? ? ?PROCEDURES: ? ?Critical Care performed: No ? ?Procedures ? ? ? ?IMPRESSION / MDM / ASSESSMENT AND PLAN / ED COURSE  ?I reviewed the triage vital signs and the nursing notes. ? ? 41 y.o. male who presents for evaluation of left knee pain status post mechanical fall.  Patient has swelling of the left knee with diffuse tenderness but no obvious deformity.  No other injuries based on history and physical exam.  X-ray of the knee negative for fracture or dislocation.  Presentation consistent with a knee contusion.  Knee was wrapped in Ace wrap and patient was given crutches.  Recommended rice therapy and follow-up with PCP.  Discussed my standard return precautions ? ?MEDICATIONS GIVEN IN ED: ?Medications  ?oxyCODONE-acetaminophen (PERCOCET/ROXICET) 5-325 MG per tablet 1 tablet (1 tablet Oral Given 12/01/21 0026)  ? ?EMR reviewed including last visit with his orthopedic surgeon for arthritis and chronic left knee pain from February 2023 ? ? ? ?FINAL CLINICAL IMPRESSION(S) / ED DIAGNOSES  ? ?Final diagnoses:  ?Fall, initial encounter  ?Contusion of left knee, initial encounter  ? ? ? ?Rx / DC Orders  ? ?ED Discharge Orders   ? ?      Ordered  ?  traMADol (ULTRAM) 50 MG tablet  Every 6 hours PRN       ? 12/01/21 0024  ? ?  ?  ? ?  ? ? ? ?Note:  This document was prepared using Dragon voice recognition software and may include unintentional dictation errors. ? ? ?Please note:  Patient was evaluated in Emergency Department today for the symptoms described in the history of present illness. Patient was evaluated in the context of the global COVID-19 pandemic, which necessitated consideration that the patient might be at risk for infection with the SARS-CoV-2 virus that causes COVID-19. Institutional protocols and algorithms that pertain to the evaluation of patients at risk for COVID-19 are  in a state of rapid change based on information released by regulatory bodies including the CDC and federal and state organizations. These policies and algorithms were followed during the patient's care in the ED.  Some ED evaluations and interventions may be delayed as a result of limited staffing during the pandemic. ? ? ? ? ?  ?Nita Sickle, MD ?12/01/21 4818 ? ?

## 2022-02-09 ENCOUNTER — Encounter: Payer: Self-pay | Admitting: *Deleted

## 2022-02-09 ENCOUNTER — Emergency Department
Admission: EM | Admit: 2022-02-09 | Discharge: 2022-02-09 | Disposition: A | Discharge status: Home / Self Care | Payer: Medicaid Other | Attending: Emergency Medicine | Admitting: Emergency Medicine

## 2022-02-09 ENCOUNTER — Emergency Department: Payer: Medicaid Other

## 2022-02-09 ENCOUNTER — Other Ambulatory Visit: Payer: Self-pay

## 2022-02-09 DIAGNOSIS — M1711 Unilateral primary osteoarthritis, right knee: Secondary | ICD-10-CM | POA: Insufficient documentation

## 2022-02-09 DIAGNOSIS — M1712 Unilateral primary osteoarthritis, left knee: Secondary | ICD-10-CM

## 2022-02-09 DIAGNOSIS — M25562 Pain in left knee: Secondary | ICD-10-CM | POA: Diagnosis present

## 2022-02-09 MED ORDER — PREDNISONE 10 MG PO TABS: 10.0000 mg | ORAL_TABLET | Freq: Every day | ORAL | 0 refills | Status: DC

## 2022-02-09 MED ORDER — PREDNISONE 20 MG PO TABS
60.0000 mg | ORAL_TABLET | Freq: Once | ORAL | Status: AC
Start: 2022-02-09 — End: 2022-02-09
  Administered 2022-02-09: 60 mg via ORAL
  Filled 2022-02-09: qty 3

## 2022-02-09 MED ORDER — HYDROCODONE-ACETAMINOPHEN 5-325 MG PO TABS
1.0000 | ORAL_TABLET | ORAL | Status: AC
  Administered 2022-02-09: 1 via ORAL
  Filled 2022-02-09: qty 1

## 2022-02-09 MED ORDER — HYDROCODONE-ACETAMINOPHEN 5-325 MG PO TABS: 1.0000 | ORAL_TABLET | Freq: Four times a day (QID) | ORAL | 0 refills | Status: DC | PRN

## 2022-02-09 NOTE — ED Triage Notes (Signed)
Pt has left knee pain.  No known injury  pt reports swelling for 1 week.  Pt alert  speech clear.

## 2022-02-09 NOTE — ED Provider Notes (Signed)
[facility] EMERGENCY DEPARTMENT Provider Note   CSN: [id number] Arrival date & time: 02/09/22  2030     History } Chief Complaint  Patient presents with   Knee Pain    Jeremy Yoder is a 41 y.o. male with acute left knee pain.  Has a history of left knee osteoarthritis with advanced medial compartment degenerative changes.  Has had conservative treatment with cortisone injections with little relief.  Patient states that his knee recently buckled and gave way.  He describes some swelling over the last couple days.  Denies any groin or thigh pain.  HPI     Home Medications Prior to Admission medications   Medication Sig Start Date End Date Taking? Authorizing Provider  HYDROcodone-acetaminophen (NORCO) 5-325 MG tablet Take 1 tablet by mouth every 6 (six) hours as needed for moderate pain. 02/09/22  Yes Evon Slack, PA-C  predniSONE (DELTASONE) 10 MG tablet Take 1 tablet (10 mg total) by mouth daily. 6,5,4,3,2,1 six day taper 02/09/22  Yes Evon Slack, PA-C  acetaminophen (TYLENOL) 500 MG tablet Take 1 tablet (500 mg total) by mouth every 6 (six) hours as needed. 07/17/18   Enid Derry, PA-C  amLODipine (NORVASC) 5 MG tablet Take 1 tablet (5 mg total) by mouth daily. 03/21/19 03/20/20  Willy Eddy, MD  diazepam (VALIUM) 5 MG tablet Take 1 tablet (5 mg total) by mouth every 8 (eight) hours as needed for anxiety. 01/23/19   Irean Hong, MD  dicyclomine (BENTYL) 10 MG capsule Take 1 capsule (10 mg total) by mouth 3 (three) times daily as needed for up to 14 days for spasms. or abdominal pain 08/17/18 08/31/18  Loleta Rose, MD  levETIRAcetam (KEPPRA) 500 MG tablet Take 1 tablet (500 mg total) by mouth 2 (two) times daily. 03/20/18   Loleta Rose, MD  metoprolol succinate (TOPROL-XL) 50 MG 24 hr tablet Take 1 tablet (50 mg total) by mouth daily. Take with or immediately following a meal. 12/26/18   Gollan, Tollie Pizza, MD  polyethylene glycol (MIRALAX) 17 g  packet Take 17 g by mouth daily. 03/11/21   Arnaldo Natal, MD  traMADol (ULTRAM) 50 MG tablet Take 1 tablet (50 mg total) by mouth every 6 (six) hours as needed. 12/01/21 12/01/22  Nita Sickle, MD      Allergies    Aspirin and Ibuprofen    Review of Systems   Review of Systems  Physical Exam Updated Vital Signs BP 125/85 (BP Location: Left Arm)   Pulse 78   Temp 98.4 F (36.9 C) (Oral)   Resp 18   Ht 6' (1.829 m)   Wt 84.4 kg   SpO2 99%   BMI 25.23 kg/m  Physical Exam Constitutional:      Appearance: He is well-developed.  HENT:     Head: Normocephalic and atraumatic.  Eyes:     Conjunctiva/sclera: Conjunctivae normal.  Cardiovascular:     Rate and Rhythm: Normal rate.  Pulmonary:     Effort: Pulmonary effort is normal. No respiratory distress.  Musculoskeletal:     Cervical back: Normal range of motion.     Comments: Left lower leg with no swelling.  No knee effusion.  No Baker's cyst.  Tender along the medial joint line of the right knee with no warmth or redness.  He is able to actively straight leg raise.  Patella is tracking well.  Neurovascular intact in left lower extremity.  0 to 115 degrees range of motion.  Skin:    General: Skin is warm.     Capillary Refill: Capillary refill takes less than 2 seconds.     Findings: No rash.  Neurological:     General: No focal deficit present.     Mental Status: He is alert and oriented to person, place, and time. Mental status is at baseline.  Psychiatric:        Mood and Affect: Mood normal.        Behavior: Behavior normal.        Thought Content: Thought content normal.     ED Results / Procedures / Treatments   Labs (all labs ordered are listed, but only abnormal results are displayed) Labs Reviewed - No data to display  EKG None  Radiology DG Knee Complete 4 Views Left  Result Date: 02/09/2022 CLINICAL DATA:  Left knee pain EXAM: LEFT KNEE - COMPLETE 4+ VIEW COMPARISON:  None Available. FINDINGS:  Normal alignment. No acute fracture or dislocation. Moderate medial compartment degenerative arthritis with joint space narrowing and subchondral sclerosis. Trace left knee effusion. Soft tissues are otherwise unremarkable. IMPRESSION: Moderate medial compartment degenerative arthritis. Electronically Signed   By: Helyn Numbers M.D.   On: 02/09/2022 21:10    Procedures Procedures    Medications Ordered in ED Medications  HYDROcodone-acetaminophen (NORCO/VICODIN) 5-325 MG per tablet 1 tablet (has no administration in time range)  predniSONE (DELTASONE) tablet 60 mg (has no administration in time range)    ED Course/ Medical Decision Making/ A&P                           Medical Decision Making Risk Prescription drug management.  41 year old male with advanced left knee osteoarthritis.  Felt his knee give way 2 days ago.  X-rays today show no evidence of acute bony abnormality.  He has no knee effusion on exam.  He is given knee brace, crutches, Norco and steroid taper.  He will follow-up with his orthopedist. Final Clinical Impression(s) / ED Diagnoses Final diagnoses:  Primary osteoarthritis of left knee    Rx / DC Orders ED Discharge Orders          Ordered    HYDROcodone-acetaminophen (NORCO) 5-325 MG tablet  Every 6 hours PRN        02/09/22 2203    predniSONE (DELTASONE) 10 MG tablet  Daily        02/09/22 2203              Ronnette Juniper 02/09/22 2206    Chesley Noon, MD 02/13/22 1458

## 2022-02-09 NOTE — Discharge Instructions (Addendum)
Please use crutches as needed for ambulation.  Wear knee brace as needed.  Take medications as prescribed and call orthopedic office to schedule follow-up appointment

## 2022-03-06 ENCOUNTER — Emergency Department
Admission: EM | Admit: 2022-03-06 | Discharge: 2022-03-06 | Discharge status: Against Medical Advice | Payer: Medicaid Other | Attending: Emergency Medicine | Admitting: Emergency Medicine

## 2022-03-06 ENCOUNTER — Emergency Department: Payer: Medicaid Other

## 2022-03-06 ENCOUNTER — Other Ambulatory Visit: Payer: Self-pay

## 2022-03-06 DIAGNOSIS — W228XXA Striking against or struck by other objects, initial encounter: Secondary | ICD-10-CM | POA: Insufficient documentation

## 2022-03-06 DIAGNOSIS — Z5321 Procedure and treatment not carried out due to patient leaving prior to being seen by health care provider: Secondary | ICD-10-CM | POA: Diagnosis not present

## 2022-03-06 DIAGNOSIS — S60221A Contusion of right hand, initial encounter: Secondary | ICD-10-CM | POA: Insufficient documentation

## 2022-03-06 DIAGNOSIS — Y9371 Activity, boxing: Secondary | ICD-10-CM | POA: Insufficient documentation
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
## 2022-03-13 ENCOUNTER — Emergency Department: Payer: Medicaid Other

## 2022-03-13 ENCOUNTER — Encounter: Payer: Self-pay | Admitting: Emergency Medicine

## 2022-03-13 ENCOUNTER — Other Ambulatory Visit: Payer: Self-pay

## 2022-03-13 DIAGNOSIS — S60221A Contusion of right hand, initial encounter: Secondary | ICD-10-CM | POA: Diagnosis not present

## 2022-03-13 DIAGNOSIS — Y9389 Activity, other specified: Secondary | ICD-10-CM | POA: Insufficient documentation

## 2022-03-13 DIAGNOSIS — W228XXA Striking against or struck by other objects, initial encounter: Secondary | ICD-10-CM | POA: Insufficient documentation

## 2022-03-13 DIAGNOSIS — Z79899 Other long term (current) drug therapy: Secondary | ICD-10-CM | POA: Insufficient documentation

## 2022-03-13 DIAGNOSIS — I10 Essential (primary) hypertension: Secondary | ICD-10-CM | POA: Diagnosis not present

## 2022-03-13 DIAGNOSIS — F17200 Nicotine dependence, unspecified, uncomplicated: Secondary | ICD-10-CM | POA: Diagnosis not present

## 2022-03-13 DIAGNOSIS — S6991XA Unspecified injury of right wrist, hand and finger(s), initial encounter: Secondary | ICD-10-CM | POA: Diagnosis present
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
## 2022-03-14 ENCOUNTER — Emergency Department
Admission: EM | Admit: 2022-03-14 | Discharge: 2022-03-14 | Disposition: A | Discharge status: Home / Self Care | Payer: Medicaid Other | Attending: Emergency Medicine | Admitting: Emergency Medicine

## 2022-03-14 DIAGNOSIS — S60221A Contusion of right hand, initial encounter: Secondary | ICD-10-CM

## 2022-03-14 MED ORDER — ONDANSETRON 4 MG PO TBDP: 4.0000 mg | ORAL_TABLET | Freq: Four times a day (QID) | ORAL | 0 refills | Status: DC | PRN

## 2022-03-14 MED ORDER — ONDANSETRON 4 MG PO TBDP
4.0000 mg | ORAL_TABLET | Freq: Once | ORAL | Status: AC
  Administered 2022-03-14: 4 mg via ORAL
  Filled 2022-03-14: qty 1

## 2022-03-14 MED ORDER — OXYCODONE-ACETAMINOPHEN 5-325 MG PO TABS: 1.0000 | ORAL_TABLET | Freq: Four times a day (QID) | ORAL | 0 refills | Status: DC | PRN

## 2022-03-14 MED ORDER — OXYCODONE-ACETAMINOPHEN 5-325 MG PO TABS
1.0000 | ORAL_TABLET | Freq: Once | ORAL | Status: AC
  Administered 2022-03-14: 1 via ORAL
  Filled 2022-03-14: qty 1
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
## 2022-03-14 NOTE — Discharge Instructions (Addendum)

## 2022-03-27 ENCOUNTER — Other Ambulatory Visit: Payer: Self-pay | Admitting: Surgery

## 2022-03-27 DIAGNOSIS — M1732 Unilateral post-traumatic osteoarthritis, left knee: Secondary | ICD-10-CM

## 2022-03-27 DIAGNOSIS — R7303 Prediabetes: Secondary | ICD-10-CM | POA: Insufficient documentation

## 2022-04-05 ENCOUNTER — Ambulatory Visit
Admission: RE | Admit: 2022-04-05 | Discharge: 2022-04-05 | Disposition: A | Discharge status: Home / Self Care | Payer: Medicaid Other | Source: Ambulatory Visit | Attending: Surgery | Admitting: Surgery

## 2022-04-05 DIAGNOSIS — M1732 Unilateral post-traumatic osteoarthritis, left knee: Secondary | ICD-10-CM | POA: Insufficient documentation

## 2022-04-24 ENCOUNTER — Other Ambulatory Visit: Payer: Self-pay | Admitting: Surgery

## 2022-05-01 ENCOUNTER — Emergency Department
Admission: EM | Admit: 2022-05-01 | Discharge: 2022-05-01 | Discharge status: Against Medical Advice | Payer: Medicaid Other | Attending: Emergency Medicine | Admitting: Emergency Medicine

## 2022-05-01 ENCOUNTER — Emergency Department
Admission: EM | Admit: 2022-05-01 | Discharge: 2022-05-01 | Disposition: A | Discharge status: Home / Self Care | Payer: Medicaid Other | Source: Home / Self Care | Attending: Emergency Medicine | Admitting: Emergency Medicine

## 2022-05-01 ENCOUNTER — Encounter: Payer: Self-pay | Admitting: Emergency Medicine

## 2022-05-01 ENCOUNTER — Encounter: Payer: Self-pay | Admitting: Radiology

## 2022-05-01 ENCOUNTER — Emergency Department: Payer: Medicaid Other

## 2022-05-01 ENCOUNTER — Other Ambulatory Visit: Payer: Self-pay

## 2022-05-01 DIAGNOSIS — S5002XA Contusion of left elbow, initial encounter: Secondary | ICD-10-CM | POA: Insufficient documentation

## 2022-05-01 DIAGNOSIS — Z5321 Procedure and treatment not carried out due to patient leaving prior to being seen by health care provider: Secondary | ICD-10-CM | POA: Insufficient documentation

## 2022-05-01 DIAGNOSIS — M25522 Pain in left elbow: Secondary | ICD-10-CM

## 2022-05-01 DIAGNOSIS — I10 Essential (primary) hypertension: Secondary | ICD-10-CM | POA: Insufficient documentation

## 2022-05-01 DIAGNOSIS — W01198A Fall on same level from slipping, tripping and stumbling with subsequent striking against other object, initial encounter: Secondary | ICD-10-CM | POA: Insufficient documentation

## 2022-05-01 MED ORDER — TRAMADOL HCL 50 MG PO TABS: 50.0000 mg | ORAL_TABLET | Freq: Four times a day (QID) | ORAL | 0 refills | Status: DC | PRN

## 2022-05-01 NOTE — ED Notes (Signed)
See triage note  Presents with pain to left elbow   States he slipped and hit it
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
## 2022-05-01 NOTE — ED Notes (Signed)
Pt states he has to leave now to pick up his children and get them to school. Pt states he is going to return later in the morning.

## 2022-05-01 NOTE — ED Provider Notes (Signed)
   [facility] Provider Note    Event Date/Time   First MD Initiated Contact with Patient 05/01/22 [phone]     (approximate)  History   Chief Complaint: Extremity Pain  HPI  Jeremy Yoder is a 41 y.o. male with a past medical history of hypertension, presents emergency department for left elbow pain.  According to the patient he states 2 days ago he slipped causing his arm to go backwards hitting it on a dumpster.  Patient states since that time he has continued to have left elbow pain.  He came to the emergency department 2 days ago for evaluation but had to leave to get his kids to school, x-rays were done but the patient was never seen.  Patient denies hitting his head, no LOC, no other complaints.  Physical Exam   Triage Vital Signs: ED Triage Vitals  Enc Vitals Group     BP 05/01/22 0850 (!) 131/90     Pulse Rate 05/01/22 0850 82     Resp 05/01/22 0850 16     Temp 05/01/22 0850 98 F (36.7 C)     Temp Source 05/01/22 0850 Oral     SpO2 05/01/22 0850 97 %     Weight 05/01/22 0922 181 lb 14.1 oz (82.5 kg)     Height 05/01/22 0922 5\' 8"  (1.727 m)     Head Circumference --      Peak Flow --      Pain Score 05/01/22 0848 6     Pain Loc --      Pain Edu? --      Excl. in [place]? --     Most recent vital signs: Vitals:   05/01/22 0850  BP: (!) 131/90  Pulse: 82  Resp: 16  Temp: 98 F (36.7 C)  SpO2: 97%    General: Awake, no distress.  CV:  Good peripheral perfusion.  Regular rate and rhythm  Resp:  Normal effort.  Equal breath sounds bilaterally.  Abd:  No distention.  Soft, nontender.  No rebound or guarding. Other:  Patient has mild tenderness of the left elbow but great range of motion.  No edema noted.  Neurovascular intact distally.   ED Results / Procedures / Treatments   RADIOLOGY  I reviewed the patient's x-rays from 2 days ago, no fracture seen.   MEDICATIONS ORDERED IN ED: Medications - No data to display   IMPRESSION / MDM /  [place] / ED COURSE  I reviewed the triage vital signs and the nursing notes.  Patient's presentation is most consistent with acute illness / injury with system symptoms.  Patient presents emergency department for right elbow pain after slipping and hitting it on a dumpster 2 days ago.  X-rays reviewed by myself do not appear to show any fracture.  It does show small calcifications/opacities, there is no open skin or concern for foreign bodies.  Discussed with the patient ice as needed as well as Lidoderm patch which she has at home.  We will prescribe a short course of tramadol and have the patient follow-up with his doctor.  Patient agreeable to plan of care.  FINAL CLINICAL IMPRESSION(S) / ED DIAGNOSES   Left elbow pain Contusion    Note:  This document was prepared using Dragon voice recognition software and may include unintentional dictation errors.   Harvest Dark, MD 05/01/22 [phone]

## 2022-05-01 NOTE — ED Triage Notes (Signed)
Patient reports he slipped on cardboard and hit left elbow onto dumpster. Reports elbow pain and swelling to site.

## 2022-05-04 ENCOUNTER — Other Ambulatory Visit: Payer: Medicaid Other

## 2022-05-11 ENCOUNTER — Ambulatory Visit: Admit: 2022-05-11 | Payer: Medicaid Other | Admitting: Surgery

## 2022-05-11 SURGERY — ARTHROPLASTY, KNEE, UNICOMPARTMENTAL
Anesthesia: Choice | Site: Knee | Laterality: Left

## 2022-06-10 ENCOUNTER — Other Ambulatory Visit: Payer: Self-pay

## 2022-06-10 ENCOUNTER — Encounter: Payer: Self-pay | Admitting: Emergency Medicine

## 2022-06-10 ENCOUNTER — Emergency Department
Admission: EM | Admit: 2022-06-10 | Discharge: 2022-06-10 | Disposition: A | Discharge status: Home / Self Care | Payer: Medicaid Other | Attending: Emergency Medicine | Admitting: Emergency Medicine

## 2022-06-10 DIAGNOSIS — F17200 Nicotine dependence, unspecified, uncomplicated: Secondary | ICD-10-CM | POA: Insufficient documentation

## 2022-06-10 DIAGNOSIS — Z20822 Contact with and (suspected) exposure to covid-19: Secondary | ICD-10-CM | POA: Diagnosis not present

## 2022-06-10 DIAGNOSIS — N390 Urinary tract infection, site not specified: Secondary | ICD-10-CM | POA: Diagnosis not present

## 2022-06-10 DIAGNOSIS — I10 Essential (primary) hypertension: Secondary | ICD-10-CM | POA: Diagnosis not present

## 2022-06-10 DIAGNOSIS — R300 Dysuria: Secondary | ICD-10-CM | POA: Diagnosis present

## 2022-06-10 LAB — URINALYSIS, ROUTINE W REFLEX MICROSCOPIC
Bilirubin Urine: NEGATIVE
Glucose, UA: NEGATIVE mg/dL
Hgb urine dipstick: NEGATIVE
Ketones, ur: NEGATIVE mg/dL
Leukocytes,Ua: NEGATIVE
Nitrite: NEGATIVE
Protein, ur: 30 mg/dL — AB
Specific Gravity, Urine: 1.03 (ref 1.005–1.030)
Squamous Epithelial / HPF: NONE SEEN (ref 0–5)
pH: 5 (ref 5.0–8.0)

## 2022-06-10 LAB — RESP PANEL BY RT-PCR (FLU A&B, COVID) ARPGX2
Influenza A by PCR: NEGATIVE
Influenza B by PCR: NEGATIVE
SARS Coronavirus 2 by RT PCR: NEGATIVE

## 2022-06-10 LAB — GROUP A STREP BY PCR: Group A Strep by PCR: NOT DETECTED

## 2022-06-10 MED ORDER — CEPHALEXIN 500 MG PO CAPS: 500.0000 mg | ORAL_CAPSULE | Freq: Four times a day (QID) | ORAL | 0 refills | Status: AC

## 2022-06-10 MED ORDER — CEPHALEXIN 500 MG PO CAPS: 500.0000 mg | ORAL_CAPSULE | Freq: Four times a day (QID) | ORAL | 0 refills | Status: DC

## 2022-06-10 MED ORDER — CEPHALEXIN 500 MG PO CAPS
500.0000 mg | ORAL_CAPSULE | Freq: Once | ORAL | Status: AC
  Administered 2022-06-10: 500 mg via ORAL
  Filled 2022-06-10: qty 1

## 2022-06-10 NOTE — ED Triage Notes (Signed)
Pt presents to ER with complaints of urinary retention reports unable to void completely. Pt denies any blood in urine. Pt also reports congestion, cough for for past week. Pt denies any fever at present. Pt talks in complete sentences no respiratory distress noted

## 2022-06-10 NOTE — ED Notes (Signed)
Bladder scan shows no urine in bladder pt was able to void to provide sample

## 2022-06-10 NOTE — Discharge Instructions (Signed)
Take antibiotics for full course as prescribed.   Thank you for choosing us for your health care today!  Please see your primary doctor this week for a follow up appointment.   If you do not have a primary doctor call the following clinics to establish care:  If you have insurance:  [facility] [phone] [address] [place] [address]   [facility]  [phone] [address] [place] [address]   If you do not have insurance:  [facility]  [phone] [address] [place] [address]  Sometimes, in the early stages of certain disease courses it is difficult to detect in the emergency department evaluation -- so, it is important that you continue to monitor your symptoms and call your doctor right away or return to the emergency department if you develop any new or worsening symptoms.  It was my pleasure to care for you today.   Jaydrian Corpening S. Tonnia Bardin, MD  

## 2022-06-10 NOTE — ED Provider Notes (Signed)
[facility] Provider Note    Event Date/Time   First MD Initiated Contact with Patient 06/10/22 [phone]     (approximate)   History   Urinary Retention   HPI  Jeremy Yoder is a 41 y.o. male   Past medical history of atrial fibrillation, hypertension, seizures, smoker who presents to the emergency department today with dysuria and a sense of incomplete emptying of his bladder for the last several days.  He has had no fever or chills.  Suprapubic discomfort.  No diarrhea.  No testicular pain.  No penile discharge.    Had a subacute cough for the last several weeks, no fever, no chest pain.  He is a smoker and plans to quit soon.  He has had ongoing left knee pain and has been evaluated by orthopedics for knee replacement but he states that they are unable to do the operation until he quit smoking.  History was obtained via patient External medical note review including emergency department visit dated February 09, 2022 for left knee pain      Physical Exam   Triage Vital Signs: ED Triage Vitals  Enc Vitals Group     BP 06/10/22 0020 119/87     Pulse Rate 06/10/22 0020 98     Resp 06/10/22 0020 16     Temp 06/10/22 0020 97.8 F (36.6 C)     Temp Source 06/10/22 0020 Oral     SpO2 06/10/22 0020 98 %     Weight 06/10/22 0016 186 lb (84.4 kg)     Height 06/10/22 0016 6' (1.829 m)     Head Circumference --      Peak Flow --      Pain Score 06/10/22 0021 5     Pain Loc --      Pain Edu? --      Excl. in [place]? --     Most recent vital signs: Vitals:   06/10/22 0020 06/10/22 0444  BP: 119/87 119/84  Pulse: 98 77  Resp: 16 16  Temp: 97.8 F (36.6 C) 97.9 F (36.6 C)  SpO2: 98% 98%    General: Awake, no distress.  CV:  Good peripheral perfusion.  Resp:  Normal effort.  Clear to auscultation without wheezing or focality Abd:  No distention. Non tender  Other:  Non toxic. L knee full ROM and no warmth/red, mild swelling. NV intact   ED Results  / Procedures / Treatments   Labs (all labs ordered are listed, but only abnormal results are displayed) Labs Reviewed  URINALYSIS, ROUTINE W REFLEX MICROSCOPIC - Abnormal; Notable for the following components:      Result Value   Color, Urine YELLOW (*)    APPearance HAZY (*)    Protein, ur 30 (*)    Bacteria, UA MANY (*)    All other components within normal limits  GROUP A STREP BY PCR  RESP PANEL BY RT-PCR (FLU A&B, COVID) [id number]     I reviewed labs and they are notable for bacteria in urine  PROCEDURES:  Critical Care performed: No  Procedures   MEDICATIONS ORDERED IN ED: Medications  cephALEXin (KEFLEX) capsule 500 mg (500 mg Oral Given 06/10/22 0446)     IMPRESSION / MDM / ASSESSMENT AND PLAN / ED COURSE  I reviewed the triage vital signs and the nursing notes.  Differential diagnosis includes, but is not limited to, infection, acute urinary retention, STI, considered but less likely infected joint for his knee pain, bacterial pneumonia for his bronchitis.  Likely chronic osteoarthritis knee pain.  Likely viral URI or bronchitis associated with smoking for his cough.    MDM: Patient with bacteriuria and symptoms consistent with urinary tract infection, no signs of pyelonephritis or sepsis.  Postvoid residual was 0.  Treat with Keflex.  No penile discharge, no high risk sexual activity, defer STI testing Knee appears well and has chronic pain, does not appear infected, no trauma to suggest fracture or dislocation. Ongoing cough related to smoking, no fever, no focalities on my exam, and viral swabs are negative.   Dispo: After careful consideration of this patient's presentation, medical and social risk factors, and evaluation in the emergency department I engaged in shared decision making with the patient and/or their representative to consider admission or observation and this patient was ultimately dc'd because evaluation as above is  unlikely to have emergent pathology requiring surgical intervention or hospitalization like infected joint, bacterial pneumonia, urosepsis patient has plan for outpatient antibiotics and follow-up with PMD  Patient's presentation is most consistent with acute presentation with potential threat to life or bodily function.       FINAL CLINICAL IMPRESSION(S) / ED DIAGNOSES   Final diagnoses:  Dysuria  Lower urinary tract infectious disease     Rx / DC Orders   ED Discharge Orders          Ordered    cephALEXin (KEFLEX) 500 MG capsule  4 times daily        06/10/22 0440             Note:  This document was prepared using Dragon voice recognition software and may include unintentional dictation errors.    Lucillie Garfinkel, MD 06/10/22 [phone]

## 2022-08-02 ENCOUNTER — Emergency Department
Admission: EM | Admit: 2022-08-02 | Discharge: 2022-08-02 | Disposition: A | Discharge status: Home / Self Care | Payer: Medicaid Other | Attending: Emergency Medicine | Admitting: Emergency Medicine

## 2022-08-02 ENCOUNTER — Other Ambulatory Visit: Payer: Self-pay

## 2022-08-02 DIAGNOSIS — I480 Paroxysmal atrial fibrillation: Secondary | ICD-10-CM | POA: Insufficient documentation

## 2022-08-02 DIAGNOSIS — R059 Cough, unspecified: Secondary | ICD-10-CM | POA: Diagnosis present

## 2022-08-02 DIAGNOSIS — Z1152 Encounter for screening for COVID-19: Secondary | ICD-10-CM | POA: Insufficient documentation

## 2022-08-02 DIAGNOSIS — I10 Essential (primary) hypertension: Secondary | ICD-10-CM | POA: Diagnosis not present

## 2022-08-02 DIAGNOSIS — R051 Acute cough: Secondary | ICD-10-CM | POA: Insufficient documentation

## 2022-08-02 LAB — RESP PANEL BY RT-PCR (RSV, FLU A&B, COVID)  RVPGX2
Influenza A by PCR: NEGATIVE
Influenza B by PCR: NEGATIVE
Resp Syncytial Virus by PCR: NEGATIVE
SARS Coronavirus 2 by RT PCR: NEGATIVE

## 2022-08-02 MED ORDER — BENZONATATE 100 MG PO CAPS: 100.0000 mg | ORAL_CAPSULE | Freq: Three times a day (TID) | ORAL | 0 refills | Status: DC | PRN

## 2022-08-02 MED ORDER — AMOXICILLIN 500 MG PO CAPS: 1000.0000 mg | ORAL_CAPSULE | Freq: Three times a day (TID) | ORAL | 0 refills | Status: AC

## 2022-08-02 NOTE — Discharge Instructions (Addendum)
-  Due to the duration of your symptoms, we will treat you for possible pneumonia.  Please take the full course of the antibiotics as prescribed.  You may additionally take the benzonatate as needed for the cough.  -Please follow-up with your primary care provider as needed.  -Return to the emergency department anytime if you begin to experience any new or worsening symptoms.

## 2022-08-02 NOTE — ED Provider Notes (Signed)
[facility] Provider Note    None    (approximate)   History   Chief Complaint Cough   HPI Jeremy Yoder is a 41 y.o. male, history of hypertension, seizure disorder, paroxysmal atrial fibrillation, presents to the emergency department for evaluation of cough.  Patient states that has been ongoing for over a month and feels like it is starting to worsen.  Reports productive cough with significant amount of sputum.  Denies fever/chills, chest pain, shortness of breath, abdominal pain, flank pain, nausea/vomiting, diarrhea, dysuria, weakness, rash/lesions, or dizziness/lightheadedness.  History Limitations: No limitations.        Physical Exam  Triage Vital Signs: ED Triage Vitals  Enc Vitals Group     BP 08/02/22 0953 (!) 159/89     Pulse Rate 08/02/22 0953 84     Resp 08/02/22 0953 16     Temp 08/02/22 0954 98.1 F (36.7 C)     Temp src --      SpO2 08/02/22 0953 100 %     Weight 08/02/22 0954 186 lb (84.4 kg)     Height 08/02/22 0954 5\' 11"  (1.803 m)     Head Circumference --      Peak Flow --      Pain Score 08/02/22 0953 0     Pain Loc --      Pain Edu? --      Excl. in [place]? --     Most recent vital signs: Vitals:   08/02/22 0953 08/02/22 0954  BP: (!) 159/89   Pulse: 84   Resp: 16   Temp:  98.1 F (36.7 C)  SpO2: 100%     General: Awake, NAD.  Audible productive cough. Skin: Warm, dry. No rashes or lesions.  Eyes: PERRL. Conjunctivae normal.  CV: Good peripheral perfusion.  Resp: Normal effort.  Lung sounds are clear bilaterally. Abd: Soft, non-tender. No distention.  Neuro: At baseline. No gross neurological deficits.  Musculoskeletal: Normal ROM of all extremities.   Physical Exam    ED Results / Procedures / Treatments  Labs (all labs ordered are listed, but only abnormal results are displayed) Labs Reviewed  RESP PANEL BY RT-PCR (RSV, FLU A&B, COVID)  [id number]     EKG N/A.    RADIOLOGY  ED Provider  Interpretation: N/A.  No results found.  PROCEDURES:  Critical Care performed: N/A.  Procedures    MEDICATIONS ORDERED IN ED: Medications - No data to display   IMPRESSION / MDM / ASSESSMENT AND PLAN / ED COURSE  I reviewed the triage vital signs and the nursing notes.                              Differential diagnosis includes, but is not limited to, influenza, 19, RSV, community-acquired pneumonia, bronchitis, viral URI.  Assessment/Plan Presentation consistent with bronchitis.  Respiratory panel negative for COVID-19, influenza, or RSV.  He appears well clinically, however given the duration of his symptoms and history of worsening, will treat as possible superimposed bacterial infection.  Provide him with a prescription for amoxicillin.  Additionally provide him with benzonatate to help manage his cough.  Patient was satisfied with this.  Recommend they follow-up with his primary care provider as needed.  Will discharge.  Provided the patient with anticipatory guidance, return precautions, and educational material. Encouraged the patient to return to the emergency department at any time if they begin to experience any  new or worsening symptoms. Patient expressed understanding and agreed with the plan.   Patient's presentation is most consistent with acute complicated illness / injury requiring diagnostic workup.       FINAL CLINICAL IMPRESSION(S) / ED DIAGNOSES   Final diagnoses:  Acute cough     Rx / DC Orders   ED Discharge Orders          Ordered    amoxicillin (AMOXIL) 500 MG capsule  3 times daily        08/02/22 1134    benzonatate (TESSALON PERLES) 100 MG capsule  3 times daily PRN        08/02/22 1134             Note:  This document was prepared using Dragon voice recognition software and may include unintentional dictation errors.   Varney Daily, [place] 08/02/22 1337    Corena Herter, MD 08/02/22 1539

## 2022-08-02 NOTE — ED Triage Notes (Signed)
Pt to ED via POV stating that he has had a cough for almost a month. Pt states that the cough is starting to get worse. Pt states that sometimes he is able to cough sputum up. Pt denies any other symptoms. Pt states that his chest is sore when coughing.

## 2022-10-29 ENCOUNTER — Emergency Department
Admission: EM | Admit: 2022-10-29 | Discharge: 2022-10-29 | Disposition: A | Discharge status: Home / Self Care | Payer: Medicaid Other | Attending: Emergency Medicine | Admitting: Emergency Medicine

## 2022-10-29 ENCOUNTER — Other Ambulatory Visit: Payer: Self-pay

## 2022-10-29 DIAGNOSIS — I10 Essential (primary) hypertension: Secondary | ICD-10-CM | POA: Insufficient documentation

## 2022-10-29 DIAGNOSIS — Z28310 Unvaccinated for covid-19: Secondary | ICD-10-CM | POA: Insufficient documentation

## 2022-10-29 DIAGNOSIS — R059 Cough, unspecified: Secondary | ICD-10-CM | POA: Diagnosis present

## 2022-10-29 DIAGNOSIS — K029 Dental caries, unspecified: Secondary | ICD-10-CM | POA: Diagnosis not present

## 2022-10-29 DIAGNOSIS — F17210 Nicotine dependence, cigarettes, uncomplicated: Secondary | ICD-10-CM | POA: Diagnosis not present

## 2022-10-29 DIAGNOSIS — U071 COVID-19: Secondary | ICD-10-CM | POA: Diagnosis not present

## 2022-10-29 LAB — RESP PANEL BY RT-PCR (RSV, FLU A&B, COVID)  RVPGX2
Influenza A by PCR: NEGATIVE
Influenza B by PCR: NEGATIVE
Resp Syncytial Virus by PCR: NEGATIVE
SARS Coronavirus 2 by RT PCR: POSITIVE — AB

## 2022-10-29 MED ORDER — AMOXICILLIN 875 MG PO TABS: 875.0000 mg | ORAL_TABLET | Freq: Two times a day (BID) | ORAL | 0 refills | Status: DC

## 2022-10-29 NOTE — ED Triage Notes (Signed)
Pt comes with c/o cough and congestion for about week. Pt also states right sided dental pain that just started.

## 2022-10-29 NOTE — ED Notes (Signed)
Pt discharge to home. Pt VSS, GCS 15, NAD. Pt verbalized understanding of discharge instructions with no additional questions at this time.  

## 2022-10-29 NOTE — Discharge Instructions (Addendum)
Follow-up with your primary care provider if any continued problems or concerns.  Your COVID test was positive and you are considered contagious for approximately 3 to 4 days.  Increase fluids and take Tylenol or ibuprofen if needed for fever, headache or bodyaches.  Also if you do not currently have a dentist a list of dental clinics is listed on your discharge papers.  A prescription for amoxicillin was sent to the pharmacy for you to begin taking for your tooth.  Also Tylenol or ibuprofen can be taken for this as well.  OPTIONS FOR DENTAL FOLLOW UP CARE  [place] [facility] and [place] [email or web address] [phone])  [facility] [phone])  [place] [phone] ext [phone])  [place] [phone])  [facility] [phone]) This clinic caters to the indigent population and is on a lottery system. Location: [employer] [facility], [place], [place], [place] [facility] Hours: Wednesdays from 6pm - 9pm, patients seen by a lottery system. For dates, call or go to [email or web address] Services: Cleanings, fillings and simple extractions. Payment Options: DENTAL WORK IS FREE OF CHARGE. Bring proof of income or support. Best way to get seen: Arrive at 5:15 pm - this is a lottery, NOT first come/first serve, so arriving earlier will not increase your chances of being seen.     [facility] [phone] Select option 1 for emergencies   Location: [facility], [place], [address] [place] [facility] Hours: No walk-ins accepted - call the day before to schedule an appointment. Check in times are 9:30 am and 1:30 pm. Services: Simple extractions, temporary fillings, pulpectomy/pulp debridement, uncomplicated abscess  drainage. Payment Options: PAYMENT IS DUE AT THE TIME OF SERVICE.  Fee is usually $100-200, additional surgical procedures (e.g. abscess drainage) may be extra. Cash, checks, Visa/MasterCard accepted.  Can file Medicaid if patient is covered for dental - patient should call case worker to check. No discount for [facility] patients. Best way to get seen: MUST call the day before and get onto the schedule. Can usually be seen the next 1-2 days. No walk-ins accepted.     [place] [phone]   Location: [place], [place] [facility] Hours: M, W, Th, F 8am or 1:30pm, Tues 9a or 1:30 - first come/first served. Services: Simple extractions, temporary fillings, uncomplicated abscess drainage.  You do not need to be an [facility] resident. Payment Options: PAYMENT IS DUE AT THE TIME OF SERVICE. Dental insurance, otherwise sliding scale - bring proof of income or support. Depending on income and treatment needed, cost is usually $50-200. Best way to get seen: Arrive early as it is first come/first served.     [facility] [phone]   Location: [place] [facility] Hours: Mon-Thu 8a-5p Services: Most basic dental services including extractions and fillings. Payment Options: PAYMENT IS DUE AT THE TIME OF SERVICE. Sliding scale, up to 50% off - bring proof if income or support. Medicaid with dental option accepted. Best way to get seen: Call to schedule an appointment, can usually be seen within 2 weeks OR they will try to see walk-ins - show up at [place] or 2p (you may have to wait).     [facility] [place] RESIDENTS ONLY   Location: [facility], [place] [address]  [place], [place] [address] Clinic Hours: By appointment only. Monday - Thursday 8am-5pm, Friday 8am-12pm Services: Cleanings, fillings, extractions. Payment  Options: PAYMENT IS DUE AT THE TIME OF SERVICE. Cash, Visa or MasterCard. Sliding scale - $30 minimum per service. Best way to get seen: Come in to office, complete packet and make an appointment - need proof of income or support monies for each household member and proof of [facility] residence. Usually takes about a month to get in.     [facility] [phone]   Location: [address] [place] [facility] Hours: Walk-in Urgent Care Dental Services are offered Monday-Friday mornings only. The numbers of emergencies accepted daily is limited to the number of providers available. Maximum 15 - Mondays, Wednesdays & Thursdays Maximum 10 - Tuesdays & Fridays Services: You do not need to be a [facility] resident to be seen for a dental emergency. Emergencies are defined as pain, swelling, abnormal bleeding, or dental trauma. Walkins will receive x-rays if needed. NOTE: Dental cleaning is not an emergency. Payment Options: PAYMENT IS DUE AT THE TIME OF SERVICE. Minimum co-pay is $40.00 for uninsured patients. Minimum co-pay is $3.00 for Medicaid with dental coverage. Dental Insurance is accepted and must be presented at time of visit. Medicare does not cover dental. Forms of payment: Cash, credit card, checks. Best way to get seen: If not previously registered with the clinic, walk-in dental registration begins at 7:15 am and is on a first come/first serve basis. If previously registered with the clinic, call to make an appointment.     [facility] [place] ONLY   Location: [address], [place], [place] Clinic Hours: Mon-Thu 10a-2p Services: Extractions only! Payment Options: FREE (donations accepted) - bring proof of income or support Best way to get seen: Call and schedule an appointment OR come at 8am on the 1st Monday of every month (except for holidays) when it is first come/first served.      [facility] [phone]   Location: [place], [place] Clinic Hours: Friday mornings Services, Payment Options, Best way to get seen: Call for info

## 2022-10-29 NOTE — ED Provider Notes (Signed)
[facility] Provider Note    Event Date/Time   First MD Initiated Contact with Patient 10/29/22 0813     (approximate)   History   Dental Pain and Cough   HPI  Jeremy Yoder is a 42 y.o. male presents to the ED with complaint of cough, congestion and not feeling well for approximately 6 to 7 days.  Patient also is here for dental pain that just started.  He denies any fever or chills at this time.  Patient did not get the COVID-vaccine.  He continues to smoke 1/2 pack cigarettes per day.  Patient has history of A-fib, hypertension, seizures and tobacco abuse.     Physical Exam   Triage Vital Signs: ED Triage Vitals  Enc Vitals Group     BP 10/29/22 0715 (!) 132/98     Pulse Rate 10/29/22 0715 98     Resp 10/29/22 0715 18     Temp 10/29/22 0715 98.3 F (36.8 C)     Temp Source 10/29/22 0715 Oral     SpO2 10/29/22 0715 96 %     Weight --      Height --      Head Circumference --      Peak Flow --      Pain Score 10/29/22 0714 5     Pain Loc --      Pain Edu? --      Excl. in [place]? --     Most recent vital signs: Vitals:   10/29/22 0715  BP: (!) 132/98  Pulse: 98  Resp: 18  Temp: 98.3 F (36.8 C)  SpO2: 96%     General: Awake, no distress.  CV:  Good peripheral perfusion.  Heart regular rate and rhythm. Resp:  Normal effort.  Lungs are clear bilaterally. Abd:  No distention.  Other:  Right central and lateral incisor are in very poor repair with cavity present in each.  Minimal gum edema in this area.   ED Results / Procedures / Treatments   Labs (all labs ordered are listed, but only abnormal results are displayed) Labs Reviewed  RESP PANEL BY RT-PCR (RSV, FLU A&B, COVID)  RVPGX2 - Abnormal; Notable for the following components:      Result Value   SARS Coronavirus 2 by RT PCR POSITIVE (*)    All other components within normal limits      PROCEDURES:  Critical Care performed:   Procedures   MEDICATIONS ORDERED IN  ED: Medications - No data to display   IMPRESSION / MDM / [place] / ED COURSE  I reviewed the triage vital signs and the nursing notes.   Differential diagnosis includes, but is not limited to, COVID, influenza, RSV, viral upper respiratory infection, dental pain, dental abscess.  42 year old male presents to the ED with dental pain and also a cough.  Patient was made aware that he is positive for COVID.  A prescription for amoxicillin 875 twice daily was sent to the pharmacy and patient is encouraged to call the dental clinics listed on his discharge papers to make a dental appointment as his teeth are in very poor repair.  Tylenol or ibuprofen as needed for body aches, fever, headache.  Patient was made aware that he is contagious and needs to isolate due to his vaccine status.  He is aware that he needs to return to the emergency department if any severe worsening of his symptoms such as difficulty breathing or shortness  of breath.      Patient's presentation is most consistent with acute complicated illness / injury requiring diagnostic workup.  FINAL CLINICAL IMPRESSION(S) / ED DIAGNOSES   Final diagnoses:  COVID  Pain due to dental caries     Rx / DC Orders   ED Discharge Orders          Ordered    amoxicillin (AMOXIL) 875 MG tablet  2 times daily        10/29/22 0844             Note:  This document was prepared using Dragon voice recognition software and may include unintentional dictation errors.   Johnn Hai, PA-C 10/29/22 1450    Delman Kitten, MD 10/29/22 [phone]

## 2022-11-30 ENCOUNTER — Other Ambulatory Visit: Payer: Self-pay

## 2022-11-30 ENCOUNTER — Emergency Department
Admission: EM | Admit: 2022-11-30 | Discharge: 2022-11-30 | Disposition: A | Discharge status: Home / Self Care | Payer: Medicaid Other | Attending: Emergency Medicine | Admitting: Emergency Medicine

## 2022-11-30 DIAGNOSIS — Z87891 Personal history of nicotine dependence: Secondary | ICD-10-CM | POA: Insufficient documentation

## 2022-11-30 DIAGNOSIS — R569 Unspecified convulsions: Secondary | ICD-10-CM | POA: Diagnosis not present

## 2022-11-30 DIAGNOSIS — I10 Essential (primary) hypertension: Secondary | ICD-10-CM | POA: Diagnosis not present

## 2022-11-30 NOTE — ED Provider Notes (Signed)
[facility] Provider Note    Event Date/Time   First MD Initiated Contact with Patient 11/30/22 2037     (approximate)   History   Seizures   HPI  Jeremy Yoder is a 42 y.o. male past medical history of atrial fibrillation seizure disorder who presents with possible seizure.  Patient's family member notes that he was taking a nap when she was not able to wake him up.  He was limp eyes were closed.  She says this is typical of seizure for him.  He then had several times when he would open his eyes and take a deep breath but then go back to keeping the eyes closed and being limp.  He then had his eyes open and they were moving from side-to-side and then around cervical.  Eventually he woke up and was somewhat confused afterward but by the time EMS arrived was back to baseline.  Patient's wife said it has been several months and has had a spell like this.  Previously seen neurology had normal EEG in 2020 normal MRI of the brain.  He is on lamotrigine which he is compliant with 100 mg daily.  Patient denies any complaints currently denies headache numbness tingling weakness drug or alcohol use or any other infectious symptoms.  He does not want any workup today he is resistant to seeing neurology does not want blood work done.     Past Medical History:  Diagnosis Date   A-fib [facility])    Hypertension    a. Noncompliant w/ meds.   Seizures (HCC)    a. Dx in teens. Noncompliant w/ meds.   Tobacco abuse     Patient Active Problem List   Diagnosis Date Noted   Dizziness 12/26/2018   Paroxysmal atrial fibrillation 01/03/2018   Essential hypertension 01/03/2018   Seizure disorder 01/03/2018     Physical Exam  Triage Vital Signs: ED Triage Vitals  Enc Vitals Group     BP 11/30/22 2034 (!) 151/99     Pulse Rate 11/30/22 2034 82     Resp 11/30/22 2034 18     Temp 11/30/22 2034 97.8 F (36.6 C)     Temp Source 11/30/22 2034 Oral     SpO2 11/30/22 2034 97 %      Weight 11/30/22 2036 207 lb 1.6 oz (93.9 kg)     Height 11/30/22 2036 6' (1.829 m)     Head Circumference --      Peak Flow --      Pain Score 11/30/22 2036 0     Pain Loc --      Pain Edu? --      Excl. in [place]? --     Most recent vital signs: Vitals:   11/30/22 2034  BP: (!) 151/99  Pulse: 82  Resp: 18  Temp: 97.8 F (36.6 C)  SpO2: 97%     General: Awake, no distress.  CV:  Good peripheral perfusion.  Resp:  Normal effort.  Abd:  No distention.  Neuro:             Awake, Alert, Oriented x 3  Other:  Aox3, nml speech  PERRL, EOMI, face symmetric, nml tongue movement  5/5 strength in the BL upper and lower extremities  Sensation grossly intact in the BL upper and lower extremities  Finger-nose-finger intact BL    ED Results / Procedures / Treatments  Labs (all labs ordered are listed, but only abnormal results are  displayed) Labs Reviewed - No data to display   EKG  EKG interpretation performed by myself: NSR, nml axis, nml intervals, no acute ischemic changes    RADIOLOGY    PROCEDURES:  Critical Care performed: No  Procedures    MEDICATIONS ORDERED IN ED: Medications - No data to display   IMPRESSION / MDM / ASSESSMENT AND PLAN / ED COURSE  I reviewed the triage vital signs and the nursing notes.                              Patient's presentation is most consistent with acute complicated illness / injury requiring diagnostic workup.  Differential diagnosis includes, but is not limited to, seizure, pseudoseizure, syncope  Patient is a 42 year old male with prior seizure disorder on lamotrigine who presents with possible seizure.  The episode that his partner describes is not clearly convincing for seizure.  Apparently he was sleeping when then he went limp and he was not able to be woken up.  He was having episodes where his eyes would open and move side-to-side but then he would go back to being limp.  This lasted for about 10 minutes.  She  says this is similar to prior episodes but lasted longer.  She does not typically call ambulance after you have these episodes but it just lasted longer so she was concerned.  Patient now is back to baseline blood sugar vital signs are reassuring initially was hypertensive but this resolved without intervention.  The patient is agitated about being in the ER he does not want any workup does not really want to talk to me.  He is however willing to let me perform an exam neurologic exam is nonfocal.  He was stuck for labs twice and is not wanting any additional attempts.  Blood sugar is normal with stable vital signs nonfocal neurologic exam do not feel that labs will add much anyway.  Last time he saw neurology was in 2021.  He had normal EEG and MRI at this time.  Unclear to me whether this is true seizures versus pseudoseizures.  I would not change his lamotrigine dose at this time.  Encouraged neurology follow-up.  He is otherwise appropriate for discharge.       FINAL CLINICAL IMPRESSION(S) / ED DIAGNOSES   Final diagnoses:  Seizure-like activity     Rx / DC Orders   ED Discharge Orders     None        Note:  This document was prepared using Dragon voice recognition software and may include unintentional dictation errors.   Georga Hacking, MD 11/30/22 2130

## 2022-11-30 NOTE — ED Triage Notes (Signed)
Pt to ED via [facility] for c/o seizures. Pt has hx of seizures. Reports multiple seizures this evening lasting about ten minutes. Pt responsive on EMS arrival with c/o headache and chest pain. 12 lead unremarkable.  BP 151/91 Cbg  112 HR 90 NS 98% RA

## 2022-11-30 NOTE — Discharge Instructions (Signed)
Please continue to take your lamotrigine as prescribed.  Please follow-up with a neurologist.

## 2022-11-30 NOTE — ED Notes (Signed)
This RN informed Sidney Ace, MD, about pt's attitude during triage and refusal to let nurse draw labs.

## 2022-11-30 NOTE — ED Notes (Signed)
Pt refused to let RN draw blood during triage in room.

## 2023-01-23 ENCOUNTER — Other Ambulatory Visit: Payer: Self-pay

## 2023-01-23 ENCOUNTER — Emergency Department
Admission: EM | Admit: 2023-01-23 | Discharge: 2023-01-23 | Disposition: A | Discharge status: Home / Self Care | Payer: Medicaid Other | Attending: Emergency Medicine | Admitting: Emergency Medicine

## 2023-01-23 ENCOUNTER — Emergency Department: Payer: Medicaid Other

## 2023-01-23 ENCOUNTER — Encounter: Payer: Self-pay | Admitting: *Deleted

## 2023-01-23 DIAGNOSIS — W228XXA Striking against or struck by other objects, initial encounter: Secondary | ICD-10-CM | POA: Insufficient documentation

## 2023-01-23 DIAGNOSIS — I10 Essential (primary) hypertension: Secondary | ICD-10-CM | POA: Diagnosis not present

## 2023-01-23 DIAGNOSIS — M25522 Pain in left elbow: Secondary | ICD-10-CM | POA: Insufficient documentation

## 2023-01-23 MED ORDER — HYDROCODONE-ACETAMINOPHEN 5-325 MG PO TABS
1.0000 | ORAL_TABLET | Freq: Once | ORAL | Status: AC
  Administered 2023-01-23: 1 via ORAL
  Filled 2023-01-23: qty 1

## 2023-01-23 MED ORDER — TRAMADOL HCL 50 MG PO TABS: 50.0000 mg | ORAL_TABLET | Freq: Four times a day (QID) | ORAL | 0 refills | Status: DC | PRN

## 2023-01-23 NOTE — ED Triage Notes (Signed)
Pt has pain in left elbow.  Pt was going down a water slide and struck the railing.  Swelling and abrasion noted to elbow.  Pt alert.

## 2023-01-23 NOTE — Discharge Instructions (Signed)
Wear the sling no more than 3 days to prevent stiffness in your elbow and shoulder.  Use ice pack over the area off and on 20 minutes per hour.  Take the pain medication as prescribed. You may also take Tylenol.

## 2023-01-24 NOTE — ED Provider Notes (Incomplete)
[facility] Provider Note    Event Date/Time   First MD Initiated Contact with Patient 01/23/23 2232     (approximate)   History   Arm Injury   HPI  Jeremy Yoder is a 42 y.o. male with history of hypertension, seizure disorder, paroxysmal atrial fibrillation and as listed in EMR presents to the emergency department for treatment and evaluation of left elbow pain.  While going down a water slide this afternoon he hit his elbow on the side.  Pain increases with attempt to fully straighten the elbow.  No alleviating measures attempted.      Physical Exam   Triage Vital Signs: ED Triage Vitals  Enc Vitals Group     BP 01/23/23 2205 (!) 135/90     Pulse Rate 01/23/23 2205 98     Resp 01/23/23 2205 20     Temp 01/23/23 2205 98.6 F (37 C)     Temp Source 01/23/23 2205 Oral     SpO2 01/23/23 2205 92 %     Weight 01/23/23 2204 195 lb (88.5 kg)     Height 01/23/23 2204 6' (1.829 m)     Head Circumference --      Peak Flow --      Pain Score 01/23/23 2209 7     Pain Loc --      Pain Edu? --      Excl. in [place]? --     Most recent vital signs: Vitals:   01/23/23 2205  BP: (!) 135/90  Pulse: 98  Resp: 20  Temp: 98.6 F (37 C)  SpO2: 92%    General: Awake, no distress.  CV:  Good peripheral perfusion.  Resp:  Normal effort.  Abd:  No distention.  Other:  Able to perform ROM of elbow with pain at full extension. No focal bony tenderness.   ED Results / Procedures / Treatments   Labs (all labs ordered are listed, but only abnormal results are displayed) Labs Reviewed - No data to display   EKG  Not indicated.   RADIOLOGY  Image and radiology report reviewed and interpreted by me. Radiology report consistent with the same.  Image of the left elbow shows no acute concerns.  PROCEDURES:  Critical Care performed: No  Procedures   MEDICATIONS ORDERED IN ED:  Medications  HYDROcodone-acetaminophen (NORCO/VICODIN) 5-325 MG per  tablet 1 tablet (1 tablet Oral Given 01/23/23 2349)     IMPRESSION / MDM / ASSESSMENT AND PLAN / ED COURSE   I have reviewed the triage note.  Differential diagnosis includes, but is not limited to, contusion, radial head fracture, radius or ulna fracture  Patient's presentation is most consistent with acute complicated illness / injury requiring diagnostic workup.  42 year old male presenting to the emergency department for treatment and evaluation of left elbow pain after hitting it on one of the slides at [facility] point.  See HPI for further details.  X-rays negative for acute bony abnormality.  This is consistent with his exam.  Plan will be to have him rest it and apply ice off-and-on throughout the day for the next few days.  Sling requested and he was advised not to wear it for more than 3 days.  He was informed of possibility of frozen shoulder if he wears it      FINAL CLINICAL IMPRESSION(S) / ED DIAGNOSES   Final diagnoses:  Elbow pain, left     Rx / DC Orders   ED Discharge  Orders          Ordered    traMADol (ULTRAM) 50 MG tablet  Every 6 hours PRN        01/23/23 2333             Note:  This document was prepared using Dragon voice recognition software and may include unintentional dictation errors.

## 2023-01-24 NOTE — ED Provider Notes (Signed)
[facility] Provider Note    Event Date/Time   First MD Initiated Contact with Patient 01/23/23 2232     (approximate)   History   Arm Injury   HPI  Jeremy Yoder is a 42 y.o. male with history of hypertension, seizure disorder, paroxysmal atrial fibrillation and as listed in EMR presents to the emergency department for treatment and evaluation of left elbow pain.  While going down a water slide this afternoon he hit his elbow on the side.  Pain increases with attempt to fully straighten the elbow.  No alleviating measures attempted.      Physical Exam   Triage Vital Signs: ED Triage Vitals  Enc Vitals Group     BP 01/23/23 2205 (!) 135/90     Pulse Rate 01/23/23 2205 98     Resp 01/23/23 2205 20     Temp 01/23/23 2205 98.6 F (37 C)     Temp Source 01/23/23 2205 Oral     SpO2 01/23/23 2205 92 %     Weight 01/23/23 2204 195 lb (88.5 kg)     Height 01/23/23 2204 6' (1.829 m)     Head Circumference --      Peak Flow --      Pain Score 01/23/23 2209 7     Pain Loc --      Pain Edu? --      Excl. in [place]? --     Most recent vital signs: Vitals:   01/23/23 2205  BP: (!) 135/90  Pulse: 98  Resp: 20  Temp: 98.6 F (37 C)  SpO2: 92%    General: Awake, no distress.  CV:  Good peripheral perfusion.  Resp:  Normal effort.  Abd:  No distention.  Other:  Able to perform ROM of elbow with pain at full extension. No focal bony tenderness.   ED Results / Procedures / Treatments   Labs (all labs ordered are listed, but only abnormal results are displayed) Labs Reviewed - No data to display   EKG  Not indicated.   RADIOLOGY  Image and radiology report reviewed and interpreted by me. Radiology report consistent with the same.  Image of the left elbow shows no acute concerns.  PROCEDURES:  Critical Care performed: No  Procedures   MEDICATIONS ORDERED IN ED:  Medications  HYDROcodone-acetaminophen (NORCO/VICODIN) 5-325 MG per  tablet 1 tablet (1 tablet Oral Given 01/23/23 2349)     IMPRESSION / MDM / ASSESSMENT AND PLAN / ED COURSE   I have reviewed the triage note.  Differential diagnosis includes, but is not limited to, contusion, radial head fracture, radius or ulna fracture  Patient's presentation is most consistent with acute complicated illness / injury requiring diagnostic workup.  42 year old male presenting to the emergency department for treatment and evaluation of left elbow pain after hitting it on one of the slides at [facility] point.  See HPI for further details.  X-rays negative for acute bony abnormality.  This is consistent with his exam.  Plan will be to have him rest it and apply ice off-and-on throughout the day for the next few days.  Sling requested and he was advised not to wear it for more than 3 days.  He is allergic to NSAIDs and states that Tylenol has not worked.  He will be given a very short course of tramadol.  He was advised to follow-up with his primary care provider if he is not improving over the week.  If symptoms change or worsen and is unable to schedule appointment, he is to return to the emergency department.      FINAL CLINICAL IMPRESSION(S) / ED DIAGNOSES   Final diagnoses:  Elbow pain, left     Rx / DC Orders   ED Discharge Orders          Ordered    traMADol (ULTRAM) 50 MG tablet  Every 6 hours PRN        01/23/23 2333             Note:  This document was prepared using Dragon voice recognition software and may include unintentional dictation errors.   Chinita Pester, FNP 01/24/23 Jorje Guild    Jene Every, MD 01/25/23 1310

## 2023-03-22 ENCOUNTER — Encounter: Payer: Self-pay | Admitting: *Deleted

## 2023-03-22 ENCOUNTER — Emergency Department: Payer: Medicaid Other

## 2023-03-22 ENCOUNTER — Emergency Department
Admission: EM | Admit: 2023-03-22 | Discharge: 2023-03-22 | Disposition: A | Discharge status: Home / Self Care | Payer: Medicaid Other | Attending: Emergency Medicine | Admitting: Emergency Medicine

## 2023-03-22 ENCOUNTER — Other Ambulatory Visit: Payer: Self-pay

## 2023-03-22 DIAGNOSIS — M1712 Unilateral primary osteoarthritis, left knee: Secondary | ICD-10-CM | POA: Insufficient documentation

## 2023-03-22 DIAGNOSIS — M25562 Pain in left knee: Secondary | ICD-10-CM

## 2023-03-22 MED ORDER — HYDROCODONE-ACETAMINOPHEN 5-325 MG PO TABS: 1.0000 | ORAL_TABLET | Freq: Four times a day (QID) | ORAL | 0 refills | Status: DC | PRN

## 2023-03-22 MED ORDER — PREDNISONE 10 MG PO TABS: 10.0000 mg | ORAL_TABLET | Freq: Every day | ORAL | 0 refills | Status: DC

## 2023-03-22 MED ORDER — HYDROCODONE-ACETAMINOPHEN 5-325 MG PO TABS
1.0000 | ORAL_TABLET | ORAL | Status: AC
  Administered 2023-03-22: 1 via ORAL
  Filled 2023-03-22: qty 1

## 2023-03-22 MED ORDER — PREDNISONE 20 MG PO TABS
60.0000 mg | ORAL_TABLET | Freq: Once | ORAL | Status: AC
  Administered 2023-03-22: 60 mg via ORAL
  Filled 2023-03-22: qty 3

## 2023-03-22 NOTE — ED Triage Notes (Signed)
Pt ambulatory to triage.  Pt reports left knee pain.  No known injury to knee.  Pt alert.

## 2023-03-22 NOTE — ED Provider Notes (Signed)
[place] EMERGENCY DEPARTMENT AT [facility] Provider Note   CSN: [id number] Arrival date & time: 03/22/23  2231     History  Chief Complaint  Patient presents with   Knee Pain    Jeremy Yoder is a 42 y.o. male.  Presents to the emergency department valuation of left knee pain.  Patient has a history of left knee pain, has had prior arthroscopic procedure many years ago, has been told he had arthritis in the past.  He denies any falls trauma or injury.  No warmth redness or fevers.  He denies any swelling in the lower leg but has had some mild swelling throughout the left knee with pain along the medial joint line with occasional instability.  HPI     Home Medications Prior to Admission medications   Medication Sig Start Date End Date Taking? Authorizing Provider  HYDROcodone-acetaminophen (NORCO) 5-325 MG tablet Take 1 tablet by mouth every 6 (six) hours as needed for moderate pain. 03/22/23  Yes Evon Slack, PA-C  predniSONE (DELTASONE) 10 MG tablet Take 1 tablet (10 mg total) by mouth daily. 6,5,4,3,2,1 six day taper 03/22/23  Yes Evon Slack, PA-C  amLODipine (NORVASC) 5 MG tablet Take 1 tablet (5 mg total) by mouth daily. 03/21/19 03/20/20  Willy Eddy, MD  amoxicillin (AMOXIL) 875 MG tablet Take 1 tablet (875 mg total) by mouth 2 (two) times daily. 10/29/22   Tommi Rumps, PA-C  dicyclomine (BENTYL) 10 MG capsule Take 1 capsule (10 mg total) by mouth 3 (three) times daily as needed for up to 14 days for spasms. or abdominal pain 08/17/18 08/31/18  Loleta Rose, MD  levETIRAcetam (KEPPRA) 500 MG tablet Take 1 tablet (500 mg total) by mouth 2 (two) times daily. 03/20/18   Loleta Rose, MD  metoprolol succinate (TOPROL-XL) 50 MG 24 hr tablet Take 1 tablet (50 mg total) by mouth daily. Take with or immediately following a meal. 12/26/18   Gollan, Tollie Pizza, MD  polyethylene glycol (MIRALAX) 17 g packet Take 17 g by mouth daily. 03/11/21   Arnaldo Natal, MD   traMADol (ULTRAM) 50 MG tablet Take 1 tablet (50 mg total) by mouth every 6 (six) hours as needed. 01/23/23   Triplett, Kasandra Knudsen, FNP      Allergies    Aspirin and Ibuprofen    Review of Systems   Review of Systems  Physical Exam Updated Vital Signs BP (!) 146/92   Pulse 79   Temp 98.4 F (36.9 C)   Resp 17   Ht 5\' 11"  (1.803 m)   Wt 87.1 kg   SpO2 98%   BMI 26.78 kg/m  Physical Exam Constitutional:      Appearance: He is well-developed.  HENT:     Head: Normocephalic and atraumatic.  Eyes:     Conjunctiva/sclera: Conjunctivae normal.  Cardiovascular:     Rate and Rhythm: Normal rate.  Pulmonary:     Effort: Pulmonary effort is normal. No respiratory distress.  Musculoskeletal:        General: Normal range of motion.     Cervical back: Normal range of motion.     Comments: Ambulates with no assistive device.  Nonantalgic gait.  Left hip moves well with internal and external rotation with no discomfort.  He is able to straight leg raise the left leg and perform full active extension of the knee with no assistance.  His patella tracks well.  No ligamentous laxity with MCL or LCL stress  testing.  He is tender along the medial joint line and nontender along the lateral joint line.  Mild swelling, minimal effusion.  No Baker's cyst.  No swelling or edema throughout the leg, negative Homans' sign.  Ankle plantarflexion dorsiflexion is intact.  Compartments are soft.  There is no redness throughout the knee.  He has 0 to 95 degrees range of motion but pain with hyperflexion.  Skin:    General: Skin is warm.     Findings: No rash.  Neurological:     General: No focal deficit present.     Mental Status: He is alert and oriented to person, place, and time.  Psychiatric:        Behavior: Behavior normal.        Thought Content: Thought content normal.     ED Results / Procedures / Treatments   Labs (all labs ordered are listed, but only abnormal results are displayed) Labs  Reviewed - No data to display  EKG None  Radiology DG Knee Complete 4 Views Left  Result Date: 03/22/2023 CLINICAL DATA:  Left knee pain.  No known injury EXAM: LEFT KNEE - COMPLETE 4+ VIEW COMPARISON:  None Available. FINDINGS: Early degenerative changes with joint space narrowing and spurring in the medial compartment. No acute bony abnormality. Specifically, no fracture, subluxation, or dislocation. No joint effusion IMPRESSION: Early osteoarthritis in the medial compartment. No acute bony abnormality. Electronically Signed   By: Charlett Nose M.D.   On: 03/22/2023 23:06    Procedures Procedures    Medications Ordered in ED Medications  predniSONE (DELTASONE) tablet 60 mg (has no administration in time range)  HYDROcodone-acetaminophen (NORCO/VICODIN) 5-325 MG per tablet 1 tablet (has no administration in time range)    ED Course/ Medical Decision Making/ A&P                                 Medical Decision Making Risk Prescription drug management.   42 year old male with focal left knee osteoarthritis in the medial compartment.  No signs of any infectious process or acute bony abnormality.  No sign of DVT.  X-rays ordered and independently reviewed by me today show joint space narrowing in the medial compartment that is moderate.  Patient is placed on a 6-day steroid taper.  Recommend rest ice elevation.  He is given 1 tab of Norco here in the ED and given a few tablets to take at home as needed until pain subsides.  I recommend he follow-up with orthopedics/PCP for cortisone injection.  We discussed signs and symptoms to return to the ER for. Final Clinical Impression(s) / ED Diagnoses Final diagnoses:  Acute pain of left knee  Primary osteoarthritis of left knee    Rx / DC Orders ED Discharge Orders          Ordered    predniSONE (DELTASONE) 10 MG tablet  Daily        03/22/23 2318    HYDROcodone-acetaminophen (NORCO) 5-325 MG tablet  Every 6 hours PRN        03/22/23  2318              Evon Slack, PA-C 03/22/23 2323    Willy Eddy, MD 03/23/23 [phone]

## 2023-03-22 NOTE — Discharge Instructions (Signed)
Please rest ice and elevate the left knee.  Take prednisone as prescribed for 6 days.  Use a cane or walking stick to help offload weight on the left knee.  Call orthopedic office to schedule follow-up appointment.

## 2023-06-10 ENCOUNTER — Other Ambulatory Visit: Payer: Self-pay

## 2023-06-10 ENCOUNTER — Emergency Department: Payer: Medicaid Other

## 2023-06-10 ENCOUNTER — Emergency Department
Admission: EM | Admit: 2023-06-10 | Discharge: 2023-06-10 | Disposition: A | Discharge status: Home / Self Care | Payer: Medicaid Other | Attending: Emergency Medicine | Admitting: Emergency Medicine

## 2023-06-10 DIAGNOSIS — J069 Acute upper respiratory infection, unspecified: Secondary | ICD-10-CM | POA: Insufficient documentation

## 2023-06-10 DIAGNOSIS — Z1152 Encounter for screening for COVID-19: Secondary | ICD-10-CM | POA: Insufficient documentation

## 2023-06-10 DIAGNOSIS — I10 Essential (primary) hypertension: Secondary | ICD-10-CM | POA: Insufficient documentation

## 2023-06-10 DIAGNOSIS — R059 Cough, unspecified: Secondary | ICD-10-CM | POA: Diagnosis present

## 2023-06-10 LAB — RESP PANEL BY RT-PCR (RSV, FLU A&B, COVID)  RVPGX2
Influenza A by PCR: NEGATIVE
Influenza B by PCR: NEGATIVE
Resp Syncytial Virus by PCR: NEGATIVE
SARS Coronavirus 2 by RT PCR: NEGATIVE

## 2023-06-10 MED ORDER — BENZONATATE 100 MG PO CAPS
ORAL_CAPSULE | ORAL | 0 refills | Status: DC
Start: 2023-06-10 — End: 2023-10-03

## 2023-06-10 MED ORDER — ALBUTEROL SULFATE HFA 108 (90 BASE) MCG/ACT IN AERS: 2.0000 | INHALATION_SPRAY | Freq: Four times a day (QID) | RESPIRATORY_TRACT | 0 refills | Status: DC | PRN

## 2023-06-10 MED ORDER — BENZONATATE 100 MG PO CAPS: ORAL_CAPSULE | ORAL | 0 refills | Status: DC

## 2023-06-10 NOTE — ED Triage Notes (Signed)
Pt c/o cough, chills, body aches, sore throat and nasal congestion x2 days. Pt denies N/V/D

## 2023-06-10 NOTE — ED Provider Notes (Signed)
[facility] Emergency Department Provider Note     Event Date/Time   First MD Initiated Contact with Patient 06/10/23 2115     (approximate)   History   URI   HPI  Jeremy Yoder is a 42 y.o. male with a history of HTN, seizure disorder, and A-fib, presents to the ED for evaluation of cold symptoms.  He reports 2 days of cough, congestion, bodies.  Also endorses some shortness of postnasal drainage.  He denies any frank fevers.  He also denies any nausea, vomiting, or bowel changes.  He presents with his wife who endorses similar symptoms.  Physical Exam   Triage Vital Signs: ED Triage Vitals  Encounter Vitals Group     BP 06/10/23 2042 (!) 152/94     Systolic BP Percentile --      Diastolic BP Percentile --      Pulse Rate 06/10/23 2042 80     Resp 06/10/23 2042 17     Temp 06/10/23 2042 98 F (36.7 C)     Temp Source 06/10/23 2042 Oral     SpO2 06/10/23 2042 97 %     Weight --      Height --      Head Circumference --      Peak Flow --      Pain Score 06/10/23 2043 0     Pain Loc --      Pain Education --      Exclude from Growth Chart --     Most recent vital signs: Vitals:   06/10/23 2042  BP: (!) 152/94  Pulse: 80  Resp: 17  Temp: 98 F (36.7 C)  SpO2: 97%    General Awake, no distress. NAD HEENT NCAT. PERRL. EOMI. No rhinorrhea. Mucous membranes are moist.  CV:  Good peripheral perfusion. RRR RESP:  Normal effort. Mild expiratory wheeze ABD:  No distention. Soft, nontender   ED Results / Procedures / Treatments   Labs (all labs ordered are listed, but only abnormal results are displayed) Labs Reviewed  RESP PANEL BY RT-PCR (RSV, FLU A&B, COVID)  [id number]   EKG   RADIOLOGY  I personally viewed and evaluated these images as part of my medical decision making, as well as reviewing the written report by the radiologist.  ED Provider Interpretation: no acute findings  DG Chest 2 View  Result Date:  06/10/2023 CLINICAL DATA:  Cough, chills, body aches, congestion EXAM: CHEST - 2 VIEW COMPARISON:  04/07/2020 FINDINGS: Frontal and lateral views of the chest demonstrate a stable cardiac silhouette. No acute airspace disease, effusion, or pneumothorax. No acute bony abnormalities. IMPRESSION: 1. No acute intrathoracic process. Electronically Signed   By: Sharlet Salina M.D.   On: 06/10/2023 21:30     PROCEDURES:  Critical Care performed: No  Procedures   MEDICATIONS ORDERED IN ED: Medications - No data to display   IMPRESSION / MDM / ASSESSMENT AND PLAN / ED COURSE  I reviewed the triage vital signs and the nursing notes.                              Differential diagnosis includes, but is not limited to, COVID, flu, RSV, viral URI, bronchitis  Patient's presentation is most consistent with acute complicated illness / injury requiring diagnostic workup.  Patient's diagnosis is consistent with viral URI with cough.  With a negative viral panel test on presentation.  No radiologic evidence of any acute intrathoracic process, based on my interpretation of images.  Stable on presentation without evidence of acute respiratory distress or dehydration.  Patient will be discharged home with prescriptions for Tessalon pearls and albuterol MDI. Patient is to follow up with primary provider as discussed, as needed or otherwise directed. Patient is given ED precautions to return to the ED for any worsening or new symptoms.  FINAL CLINICAL IMPRESSION(S) / ED DIAGNOSES   Final diagnoses:  Viral URI with cough     Rx / DC Orders   ED Discharge Orders          Ordered    benzonatate (TESSALON PERLES) 100 MG capsule  Status:  Discontinued        06/10/23 2151    albuterol (VENTOLIN HFA) 108 (90 Base) MCG/ACT inhaler  Every 6 hours PRN,   Status:  Discontinued        06/10/23 2151    albuterol (VENTOLIN HFA) 108 (90 Base) MCG/ACT inhaler  Every 6 hours PRN        06/10/23 2155     benzonatate (TESSALON PERLES) 100 MG capsule        06/10/23 2155             Note:  This document was prepared using Dragon voice recognition software and may include unintentional dictation errors.    Lissa Hoard, PA-C 06/10/23 2159    Minna Antis, MD 06/10/23 [phone]

## 2023-06-10 NOTE — Discharge Instructions (Addendum)
Your exam, labs and chest XR is normal and reassuring. Take the prescription meds as directed. Follow-up with your primary provider as needed.  OTC Delsym for additional cough relief.

## 2023-06-25 ENCOUNTER — Emergency Department: Payer: Medicaid Other

## 2023-06-25 ENCOUNTER — Other Ambulatory Visit: Payer: Self-pay

## 2023-06-25 DIAGNOSIS — M25571 Pain in right ankle and joints of right foot: Secondary | ICD-10-CM | POA: Diagnosis present

## 2023-06-25 NOTE — ED Triage Notes (Addendum)
Pt states he rolled his right ankle 3 days ago and has continued pain. Pt ambulatory to triage.

## 2023-06-26 ENCOUNTER — Emergency Department
Admission: EM | Admit: 2023-06-26 | Discharge: 2023-06-26 | Disposition: A | Discharge status: Home / Self Care | Payer: Medicaid Other | Attending: Emergency Medicine | Admitting: Emergency Medicine

## 2023-06-26 DIAGNOSIS — M25571 Pain in right ankle and joints of right foot: Secondary | ICD-10-CM

## 2023-06-26 MED ORDER — OXYCODONE-ACETAMINOPHEN 5-325 MG PO TABS
2.0000 | ORAL_TABLET | Freq: Once | ORAL | Status: AC
  Administered 2023-06-26: 2 via ORAL
  Filled 2023-06-26: qty 2

## 2023-06-26 NOTE — ED Provider Notes (Signed)
[facility] Provider Note    Event Date/Time   First MD Initiated Contact with Patient 06/26/23 [phone]     (approximate)   History   Ankle Pain   HPI Jeremy Yoder is a 42 y.o. male who presents for evaluation of pain in his right foot and ankle.  He reports that he stepped off of a curb and twisted and rolled it.  He cannot bear weight due to the pain.  He has some swelling.  No other injuries occurred as a result of his fall.     Physical Exam   Triage Vital Signs: ED Triage Vitals  Encounter Vitals Group     BP 06/25/23 2320 (!) 152/98     Systolic BP Percentile --      Diastolic BP Percentile --      Pulse Rate 06/25/23 2320 82     Resp 06/25/23 2320 18     Temp 06/25/23 2320 98.2 F (36.8 C)     Temp Source 06/25/23 2320 Oral     SpO2 06/25/23 2320 96 %     Weight 06/25/23 2319 81.6 kg (180 lb)     Height 06/25/23 2319 1.829 m (6')     Head Circumference --      Peak Flow --      Pain Score 06/25/23 2319 7     Pain Loc --      Pain Education --      Exclude from Growth Chart --     Most recent vital signs: Vitals:   06/26/23 0231 06/26/23 0234  BP: (!) 147/99 (!) 147/99  Pulse:  82  Resp: 18 18  Temp: 98.2 F (36.8 C) 98.2 F (36.8 C)  SpO2: 95% 95%    General: Awake, no obvious distress. CV:  Good peripheral perfusion including in his right foot. Resp:  Normal effort. Speaking easily and comfortably, no accessory muscle usage nor intercostal retractions.   Abd:  No distention.  Other:  Mild swelling of the right ankle most notable in the lateral malleolus.  Tenderness to palpation and manipulation of the foot and ankle.  Tenderness throughout the foot but without any specific point tenderness at any 1 location to suggest foot fracture.   ED Results / Procedures / Treatments   Labs (all labs ordered are listed, but only abnormal results are displayed) Labs Reviewed - No data to display     RADIOLOGY I viewed and  interpreted the patient's foot and ankle x-rays and do not see any obvious signs of fracture or dislocation.  He has a noticeable calcaneal spur or calcification of the Achilles tendon, but no point tenderness at that location.  Official radiology reports pending at time of discharge due to backlog of imaging reports tonight.   PROCEDURES:  Critical Care performed: No  Procedures    IMPRESSION / MDM / ASSESSMENT AND PLAN / ED COURSE  I reviewed the triage vital signs and the nursing notes.                              Differential diagnosis includes, but is not limited to, ankle sprain, ankle fracture, foot fracture, dislocation.  Patient's presentation is most consistent with acute complicated illness / injury requiring diagnostic workup.  Labs/studies ordered: Right ankle and right foot x-rays.  Interventions/Medications given:  Medications  oxyCODONE-acetaminophen (PERCOCET/ROXICET) 5-325 MG per tablet 2 tablet (has no administration in time range)    (  Note:  hospital course my include additional interventions and/or labs/studies not listed above.)   No evidence of an emergent injury.  Suspect sprain, as documented above I do not see an obvious fracture or dislocation of the foot or ankle.  Radiology is very backlogged tonight and x-ray interpretations are taking hours.  I offered the patient that he could wait for the report, or we could give him a cam boot and crutches which should be appropriate for relatively minor fractures as well as the sprain.  He would prefer to have the boot and crutches.  He knows to follow-up with the reports on MyChart as well as to follow-up with podiatry.  I gave my usual and customary return precautions.  He received a dose of 2 Percocet while in the emergency department.         FINAL CLINICAL IMPRESSION(S) / ED DIAGNOSES   Final diagnoses:  Acute right ankle pain     Rx / DC Orders   ED Discharge Orders     None         Note:  This document was prepared using Dragon voice recognition software and may include unintentional dictation errors.   Loleta Rose, MD 06/26/23 [phone]

## 2023-06-26 NOTE — Discharge Instructions (Addendum)
As we discussed, it appears that you have an ankle sprain.  You may have a subtle fracture not easily visible on the x-rays, but you can check MyChart for the official radiology report by some point tomorrow.  Regardless, we recommend you use the provided CAM boot and crutches, only bear weight as tolerated on your right foot, and use over-the-counter ibuprofen and Tylenol according to label instructions for pain control.  Read through the included information about RICE therapy (Rest, Ice, Compression, and Elevation).  Call the office of Dr. Lilian Kapur to schedule a follow-up appointment with him or one of his colleagues in the podiatry clinic.    Return to the emergency department if you develop new or worsening symptoms that concern you.

## 2023-08-19 ENCOUNTER — Encounter: Payer: Self-pay | Admitting: Emergency Medicine

## 2023-08-19 ENCOUNTER — Emergency Department
Admission: EM | Admit: 2023-08-19 | Discharge: 2023-08-20 | Discharge status: Against Medical Advice | Payer: Medicaid Other | Attending: Emergency Medicine | Admitting: Emergency Medicine

## 2023-08-19 ENCOUNTER — Other Ambulatory Visit: Payer: Self-pay

## 2023-08-19 ENCOUNTER — Emergency Department: Payer: Medicaid Other

## 2023-08-19 DIAGNOSIS — Y92096 Garden or yard of other non-institutional residence as the place of occurrence of the external cause: Secondary | ICD-10-CM | POA: Diagnosis not present

## 2023-08-19 DIAGNOSIS — M25562 Pain in left knee: Secondary | ICD-10-CM | POA: Diagnosis not present

## 2023-08-19 DIAGNOSIS — R1012 Left upper quadrant pain: Secondary | ICD-10-CM | POA: Insufficient documentation

## 2023-08-19 DIAGNOSIS — Z5321 Procedure and treatment not carried out due to patient leaving prior to being seen by health care provider: Secondary | ICD-10-CM | POA: Insufficient documentation

## 2023-08-19 DIAGNOSIS — R0781 Pleurodynia: Secondary | ICD-10-CM | POA: Diagnosis present
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
## 2023-09-07 ENCOUNTER — Other Ambulatory Visit: Payer: Self-pay

## 2023-09-07 ENCOUNTER — Emergency Department: Payer: Medicaid Other

## 2023-09-07 ENCOUNTER — Encounter: Payer: Self-pay | Admitting: Emergency Medicine

## 2023-09-07 ENCOUNTER — Emergency Department
Admission: EM | Admit: 2023-09-07 | Discharge: 2023-09-07 | Disposition: A | Discharge status: Home / Self Care | Payer: Medicaid Other | Attending: Emergency Medicine | Admitting: Emergency Medicine

## 2023-09-07 DIAGNOSIS — M25561 Pain in right knee: Secondary | ICD-10-CM | POA: Insufficient documentation

## 2023-09-07 NOTE — ED Triage Notes (Signed)
Patient ambulatory to triage with complaints of right sided knee pain. States he was walking his dog earlier tonight when it buckled and he fell. Appears slightly swollen to this RN. Patient states he just wanted to be seen since his son was already here.

## 2023-09-07 NOTE — ED Provider Notes (Signed)
[facility] Provider Note    Event Date/Time   First MD Initiated Contact with Patient 09/07/23 [phone]     (approximate)   History   Knee Pain   HPI Jeremy Yoder is a 43 y.o. male who presents for evaluation of pain in his right knee.  He states that he was walking the dog and his knee "just gave out on me".  He is able to walk on it but it hurts to do so.  He said it hurts on the inside, not on the outside.  No prior issues.  No numbness or tingling below the injury.     Physical Exam   Triage Vital Signs: ED Triage Vitals  Encounter Vitals Group     BP 09/07/23 0033 113/82     Systolic BP Percentile --      Diastolic BP Percentile --      Pulse Rate 09/07/23 0033 83     Resp 09/07/23 0033 16     Temp 09/07/23 0033 98.1 F (36.7 C)     Temp Source 09/07/23 0033 Oral     SpO2 09/07/23 0033 99 %     Weight 09/07/23 0033 86.6 kg (191 lb)     Height 09/07/23 0033 1.829 m (6')     Head Circumference --      Peak Flow --      Pain Score 09/07/23 0039 8     Pain Loc --      Pain Education --      Exclude from Growth Chart --     Most recent vital signs: Vitals:   09/07/23 0033  BP: 113/82  Pulse: 83  Resp: 16  Temp: 98.1 F (36.7 C)  SpO2: 99%    General: Awake, no distress.  CV:  Good peripheral perfusion.  Resp:  Normal effort. Speaking easily and comfortably, no accessory muscle usage nor intercostal retractions.   Abd:  No distention.  Other:  Ambulatory without difficulty.  Patient reports some mild pain with flexion and extension.  No visible or palpable deformity including no effusion.   ED Results / Procedures / Treatments   Labs (all labs ordered are listed, but only abnormal results are displayed) Labs Reviewed - No data to display    RADIOLOGY I viewed and interpreted the patient's right knee x-rays and I see no evidence of effusion or bony abnormality.  I also read the radiologist's report, which confirmed no acute  findings.   PROCEDURES:  Critical Care performed: No  Procedures    IMPRESSION / MDM / ASSESSMENT AND PLAN / ED COURSE  I reviewed the triage vital signs and the nursing notes.                              Differential diagnosis includes, but is not limited to, musculoskeletal strain, internal derangement of knee, fracture, dislocation.  Patient's presentation is most consistent with acute complicated illness / injury requiring diagnostic workup.  Labs/studies ordered: Right knee x-rays  Interventions/Medications given:  Medications - No data to display  (Note:  hospital course my include additional interventions and/or labs/studies not listed above.)   Normal vitals, weightbearing and ambulatory, patient reports that he just came in to get checked out because he had to bring in his son so he thought he would get checked out too.  Suspect musculoskeletal strain given normal x-rays.  Ace wrap, RICE, outpatient follow-up, weightbearing  as tolerated.         FINAL CLINICAL IMPRESSION(S) / ED DIAGNOSES   Final diagnoses:  Acute pain of right knee     Rx / DC Orders   ED Discharge Orders     None        Note:  This document was prepared using Dragon voice recognition software and may include unintentional dictation errors.   Loleta Rose, MD 09/07/23 [phone]

## 2023-09-07 NOTE — Discharge Instructions (Signed)
We believe that your symptoms are caused by musculoskeletal strain.  Please read through the included information about additional care such as cold therapy, over-the-counter pain medicine.  .  Remember that early mobility and using the affected part of your body is actually better than keeping it immobile.  Follow-up with the doctor listed as recommended or return to the emergency department with new or worsening symptoms that concern you.

## 2023-09-21 ENCOUNTER — Other Ambulatory Visit: Payer: Self-pay

## 2023-09-21 ENCOUNTER — Emergency Department: Payer: Medicaid Other

## 2023-09-21 ENCOUNTER — Emergency Department
Admission: EM | Admit: 2023-09-21 | Discharge: 2023-09-21 | Disposition: A | Discharge status: Home / Self Care | Payer: Medicaid Other | Attending: Emergency Medicine | Admitting: Emergency Medicine

## 2023-09-21 DIAGNOSIS — Z20822 Contact with and (suspected) exposure to covid-19: Secondary | ICD-10-CM | POA: Diagnosis not present

## 2023-09-21 DIAGNOSIS — J101 Influenza due to other identified influenza virus with other respiratory manifestations: Secondary | ICD-10-CM | POA: Insufficient documentation

## 2023-09-21 DIAGNOSIS — R197 Diarrhea, unspecified: Secondary | ICD-10-CM | POA: Diagnosis present

## 2023-09-21 DIAGNOSIS — I10 Essential (primary) hypertension: Secondary | ICD-10-CM | POA: Diagnosis not present

## 2023-09-21 DIAGNOSIS — Z720 Tobacco use: Secondary | ICD-10-CM | POA: Diagnosis not present

## 2023-09-21 DIAGNOSIS — Z716 Tobacco abuse counseling: Secondary | ICD-10-CM | POA: Diagnosis not present

## 2023-09-21 LAB — URINALYSIS, ROUTINE W REFLEX MICROSCOPIC
Bilirubin Urine: NEGATIVE
Glucose, UA: NEGATIVE mg/dL
Ketones, ur: NEGATIVE mg/dL
Leukocytes,Ua: NEGATIVE
Nitrite: NEGATIVE
Protein, ur: 100 mg/dL — AB
Specific Gravity, Urine: 1.032 — ABNORMAL HIGH (ref 1.005–1.030)
Squamous Epithelial / HPF: 0 /[HPF] (ref 0–5)
pH: 5 (ref 5.0–8.0)

## 2023-09-21 LAB — RESP PANEL BY RT-PCR (RSV, FLU A&B, COVID)  RVPGX2
Influenza A by PCR: POSITIVE — AB
Influenza B by PCR: NEGATIVE
Resp Syncytial Virus by PCR: NEGATIVE
SARS Coronavirus 2 by RT PCR: NEGATIVE

## 2023-09-21 NOTE — ED Provider Triage Note (Signed)
 Emergency Medicine Provider Triage Evaluation Note  Jeremy Yoder , a 43 y.o. male  was evaluated in triage.  Pt complains of diarrhea, feeling weak, fever/chills, x 3 days, no v, no cough.  Review of Systems  Positive:  Negative:   Physical Exam  There were no vitals taken for this visit. Gen:   Awake, no distress   Resp:  Normal effort  MSK:   Moves extremities without difficulty  Other:    Medical Decision Making  Medically screening exam initiated at 11:51 AM.  Appropriate orders placed.  Jeremy Yoder was informed that the remainder of the evaluation will be completed by another provider, this initial triage assessment does not replace that evaluation, and the importance of remaining in the ED until their evaluation is complete.     Gasper Devere ORN, PA-C 09/21/23 1152

## 2023-09-21 NOTE — ED Provider Notes (Signed)
 [facility] Provider Note    Event Date/Time   First MD Initiated Contact with Patient 09/21/23 1357     (approximate)   History   No chief complaint on file.   HPI  Jeremy Yoder is a 43 y.o. male history of hypertension, seizures, tobacco abuse presents emergency department stating that he has been feeling weak and feverish with bodyaches for 3 days.  Started having diarrhea today.  No vomiting, no chest pain, no shortness of breath.      Physical Exam   Triage Vital Signs: ED Triage Vitals [09/21/23 1151]  Encounter Vitals Group     BP (!) 130/96     Systolic BP Percentile      Diastolic BP Percentile      Pulse Rate 96     Resp 20     Temp 98.3 F (36.8 C)     Temp Source Oral     SpO2 94 %     Weight      Height      Head Circumference      Peak Flow      Pain Score 7     Pain Loc      Pain Education      Exclude from Growth Chart     Most recent vital signs: Vitals:   09/21/23 1151  BP: (!) 130/96  Pulse: 96  Resp: 20  Temp: 98.3 F (36.8 C)  SpO2: 94%     General: Awake, no distress.   CV:  Good peripheral perfusion. regular rate and  rhythm Resp:  Normal effort. Lungs CTA Abd:  No distention.   Other:      ED Results / Procedures / Treatments   Labs (all labs ordered are listed, but only abnormal results are displayed) Labs Reviewed  RESP PANEL BY RT-PCR (RSV, FLU A&B, COVID)  RVPGX2 - Abnormal; Notable for the following components:      Result Value   Influenza A by PCR POSITIVE (*)    All other components within normal limits  URINALYSIS, ROUTINE W REFLEX MICROSCOPIC - Abnormal; Notable for the following components:   Color, Urine AMBER (*)    APPearance HAZY (*)    Specific Gravity, Urine 1.032 (*)    Hgb urine dipstick SMALL (*)    Protein, ur 100 (*)    Bacteria, UA RARE (*)    All other components within normal limits     EKG     RADIOLOGY Chest  x-ray    PROCEDURES:   Procedures No chief complaint on file.     MEDICATIONS ORDERED IN ED: Medications - No data to display   IMPRESSION / MDM / ASSESSMENT AND PLAN / ED COURSE  I reviewed the triage vital signs and the nursing notes.                              Differential diagnosis includes, but is not limited to, COVID, influenza, RSV, CAP, UTI, dehydration, gastroenteritis, colitis, diarrhea  Patient's presentation is most consistent with acute illness / injury with system symptoms.   Respiratory panel is positive for influenza A  UA is reassuring  Chest x-ray independently reviewed interpreted by me as being negative for any acute abnormality  Did explain these findings to the patient.  Has been sick for 3 days so too late for Tamiflu.  He is to follow-up with his regular doctor if  not improving 3 days, return emergency department if worsening.  Encouraged to drink plenty of fluids.  He is in agreement treatment plan.  Discharged stable condition.  We did discuss smoking cessation.      FINAL CLINICAL IMPRESSION(S) / ED DIAGNOSES   Final diagnoses:  Influenza A     Rx / DC Orders   ED Discharge Orders     None        Note:  This document was prepared using Dragon voice recognition software and may include unintentional dictation errors.    Gasper Devere ORN, PA-C 09/21/23 1419    Willo Dunnings, MD 09/21/23 [phone]

## 2023-09-21 NOTE — ED Triage Notes (Addendum)
 Pt to ED via POV from home. Pt reports feeling weak x3 days and started having diarrhea. Pt denies N/V, CP or SOB. Pt reports cough and pain with breathing

## 2023-10-03 ENCOUNTER — Emergency Department: Payer: Medicaid Other

## 2023-10-03 ENCOUNTER — Emergency Department
Admission: EM | Admit: 2023-10-03 | Discharge: 2023-10-03 | Disposition: A | Discharge status: Home / Self Care | Payer: Medicaid Other | Attending: Emergency Medicine | Admitting: Emergency Medicine

## 2023-10-03 ENCOUNTER — Other Ambulatory Visit: Payer: Self-pay

## 2023-10-03 DIAGNOSIS — J40 Bronchitis, not specified as acute or chronic: Secondary | ICD-10-CM | POA: Diagnosis not present

## 2023-10-03 DIAGNOSIS — Z87891 Personal history of nicotine dependence: Secondary | ICD-10-CM | POA: Insufficient documentation

## 2023-10-03 DIAGNOSIS — Z20822 Contact with and (suspected) exposure to covid-19: Secondary | ICD-10-CM | POA: Diagnosis not present

## 2023-10-03 DIAGNOSIS — I10 Essential (primary) hypertension: Secondary | ICD-10-CM | POA: Insufficient documentation

## 2023-10-03 DIAGNOSIS — R059 Cough, unspecified: Secondary | ICD-10-CM | POA: Diagnosis present

## 2023-10-03 LAB — RESP PANEL BY RT-PCR (RSV, FLU A&B, COVID)  RVPGX2
Influenza A by PCR: NEGATIVE
Influenza B by PCR: NEGATIVE
Resp Syncytial Virus by PCR: NEGATIVE
SARS Coronavirus 2 by RT PCR: NEGATIVE

## 2023-10-03 MED ORDER — AZITHROMYCIN 250 MG PO TABS: ORAL_TABLET | ORAL | 0 refills | Status: DC

## 2023-10-03 MED ORDER — IPRATROPIUM-ALBUTEROL 0.5-2.5 (3) MG/3ML IN SOLN
3.0000 mL | Freq: Once | RESPIRATORY_TRACT | Status: AC
  Administered 2023-10-03: 3 mL via RESPIRATORY_TRACT
  Filled 2023-10-03: qty 3

## 2023-10-03 MED ORDER — ALBUTEROL SULFATE HFA 108 (90 BASE) MCG/ACT IN AERS
2.0000 | INHALATION_SPRAY | Freq: Four times a day (QID) | RESPIRATORY_TRACT | 2 refills | Status: AC | PRN
Start: 2023-10-03 — End: ?

## 2023-10-03 MED ORDER — BENZONATATE 100 MG PO CAPS: 100.0000 mg | ORAL_CAPSULE | Freq: Three times a day (TID) | ORAL | 0 refills | Status: DC | PRN

## 2023-10-03 MED ORDER — PREDNISONE 50 MG PO TABS: ORAL_TABLET | ORAL | 0 refills | Status: DC

## 2023-10-03 MED ORDER — ALBUTEROL SULFATE HFA 108 (90 BASE) MCG/ACT IN AERS: 2.0000 | INHALATION_SPRAY | Freq: Four times a day (QID) | RESPIRATORY_TRACT | 2 refills | Status: DC | PRN

## 2023-10-03 MED ORDER — PREDNISONE 20 MG PO TABS
60.0000 mg | ORAL_TABLET | Freq: Once | ORAL | Status: AC
  Administered 2023-10-03: 60 mg via ORAL
  Filled 2023-10-03: qty 3

## 2023-10-03 MED ORDER — AZITHROMYCIN 250 MG PO TABS: ORAL_TABLET | ORAL | 0 refills | Status: AC

## 2023-10-03 NOTE — ED Triage Notes (Signed)
Pt reports cough chills and congestion for the past few days, pt reports 2-3 weeks ago he tested + for flu

## 2023-10-03 NOTE — ED Provider Notes (Signed)
[facility] Provider Note    Event Date/Time   First MD Initiated Contact with Patient 10/03/23 2230     (approximate)   History   Cough   HPI  Jeremy Yoder is a 43 y.o. male with PMH of hypertension, tobacco abuse, seizures and A-fib presents for evaluation of cough and general malaise.  Patient states he was seen here 2 weeks ago and was diagnosed with the flu.  He states he does not feel like he has gotten any better since.  He has had ongoing cough as well as fevers.      Physical Exam   Triage Vital Signs: ED Triage Vitals  Encounter Vitals Group     BP 10/03/23 2207 (!) 135/93     Systolic BP Percentile --      Diastolic BP Percentile --      Pulse Rate 10/03/23 2207 96     Resp 10/03/23 2207 18     Temp 10/03/23 2207 97.7 F (36.5 C)     Temp Source 10/03/23 2207 Oral     SpO2 10/03/23 2207 99 %     Weight 10/03/23 2206 196 lb (88.9 kg)     Height 10/03/23 2206 6' (1.829 m)     Head Circumference --      Peak Flow --      Pain Score 10/03/23 2206 0     Pain Loc --      Pain Education --      Exclude from Growth Chart --     Most recent vital signs: Vitals:   10/03/23 2207  BP: (!) 135/93  Pulse: 96  Resp: 18  Temp: 97.7 F (36.5 C)  SpO2: 99%   General: Awake, appears uncomfortable, coughing frequently. CV:  Good peripheral perfusion.  RRR. Resp:  Normal effort.  Wheezing bilaterally. Abd:  No distention.  Other:      ED Results / Procedures / Treatments   Labs (all labs ordered are listed, but only abnormal results are displayed) Labs Reviewed  RESP PANEL BY RT-PCR (RSV, FLU A&B, COVID)  RVPGX2    RADIOLOGY  Chest x-ray obtained, I interpreted the images as well as reviewed the radiologist report, which was negative for any acute cardiopulmonary abnormalities.  PROCEDURES:  Critical Care performed: No  Procedures   MEDICATIONS ORDERED IN ED: Medications  predniSONE (DELTASONE) tablet 60 mg (has no  administration in time range)  ipratropium-albuterol (DUONEB) 0.5-2.5 (3) MG/3ML nebulizer solution 3 mL (3 mLs Nebulization Given 10/03/23 2306)  ipratropium-albuterol (DUONEB) 0.5-2.5 (3) MG/3ML nebulizer solution 3 mL (3 mLs Nebulization Given 10/03/23 2306)     IMPRESSION / MDM / ASSESSMENT AND PLAN / ED COURSE  I reviewed the triage vital signs and the nursing notes.                             43 year old male presents for evaluation of flulike symptoms.  Patient is hypertensive in triage vital signs stable otherwise.  Differential diagnosis includes, but is not limited to, flu, COVID, RSV, bronchitis, pneumonia.  Patient's presentation is most consistent with acute complicated illness / injury requiring diagnostic workup.  Chest x-ray was negative.  Patient had wheezing bilaterally on auscultation.  He was given 2 breathing treatments and reported an improvement in his symptoms after receiving them.  Given that he has had symptoms for 2 weeks, I am suspicious of a bacterial infection.  I will  treat him with a Z-Pak as well as some steroids.  He was also sent some Tessalon Perles and an albuterol inhaler.  I advised him to schedule a follow-up appointment with his primary care provider if he is not improving after finishing the antibiotics.  He voiced understanding, all questions were answered and he was stable at discharge.   FINAL CLINICAL IMPRESSION(S) / ED DIAGNOSES   Final diagnoses:  Bronchitis     Rx / DC Orders   ED Discharge Orders          Ordered    albuterol (VENTOLIN HFA) 108 (90 Base) MCG/ACT inhaler  Every 6 hours PRN        10/03/23 2312    benzonatate (TESSALON PERLES) 100 MG capsule  3 times daily PRN        10/03/23 2312    predniSONE (DELTASONE) 50 MG tablet        10/03/23 2312    azithromycin (ZITHROMAX Z-PAK) 250 MG tablet        10/03/23 2312             Note:  This document was prepared using Dragon voice recognition software and may  include unintentional dictation errors.   Cameron Ali, PA-C 10/03/23 2330    Merwyn Katos, MD 10/03/23 [phone]

## 2023-10-03 NOTE — Discharge Instructions (Signed)
You tested negative for flu, COVID and RSV today.  Your chest x-ray did not show evidence of pneumonia.  I believe your symptoms are caused by bronchitis.  Information about bronchitis is attached to this paperwork.  Please take the antibiotics and prednisone as prescribed.  Use the albuterol inhaler as needed for wheezing and shortness of breath.  I have also sent a different cough medication for you to try called Tessalon Perles.  You can continue to take Tylenol and ibuprofen as needed for fever and bodyaches.  It is also okay for you to continue taking the over-the-counter cold medication.  Please be seen by your primary care provider if you do not have improvement after completing the antibiotics.

## 2023-10-15 ENCOUNTER — Other Ambulatory Visit: Payer: Self-pay | Admitting: Orthopedic Surgery

## 2023-10-15 ENCOUNTER — Encounter: Payer: Self-pay | Admitting: Orthopedic Surgery

## 2023-10-15 DIAGNOSIS — S83231A Complex tear of medial meniscus, current injury, right knee, initial encounter: Secondary | ICD-10-CM

## 2023-10-28 ENCOUNTER — Emergency Department
Admission: EM | Admit: 2023-10-28 | Discharge: 2023-10-28 | Disposition: A | Discharge status: Home / Self Care | Attending: Emergency Medicine | Admitting: Emergency Medicine

## 2023-10-28 ENCOUNTER — Other Ambulatory Visit: Payer: Self-pay

## 2023-10-28 DIAGNOSIS — F17210 Nicotine dependence, cigarettes, uncomplicated: Secondary | ICD-10-CM | POA: Insufficient documentation

## 2023-10-28 DIAGNOSIS — I10 Essential (primary) hypertension: Secondary | ICD-10-CM | POA: Insufficient documentation

## 2023-10-28 DIAGNOSIS — R569 Unspecified convulsions: Secondary | ICD-10-CM | POA: Diagnosis present

## 2023-10-28 DIAGNOSIS — J9801 Acute bronchospasm: Secondary | ICD-10-CM | POA: Diagnosis not present

## 2023-10-28 LAB — CBC WITH DIFFERENTIAL/PLATELET
Abs Immature Granulocytes: 0.04 10*3/uL (ref 0.00–0.07)
Basophils Absolute: 0.1 10*3/uL (ref 0.0–0.1)
Basophils Relative: 1 %
Eosinophils Absolute: 0.2 10*3/uL (ref 0.0–0.5)
Eosinophils Relative: 2 %
HCT: 50.4 % (ref 39.0–52.0)
Hemoglobin: 16.9 g/dL (ref 13.0–17.0)
Immature Granulocytes: 0 %
Lymphocytes Relative: 25 %
Lymphs Abs: 3.1 10*3/uL (ref 0.7–4.0)
MCH: 31.4 pg (ref 26.0–34.0)
MCHC: 33.5 g/dL (ref 30.0–36.0)
MCV: 93.7 fL (ref 80.0–100.0)
Monocytes Absolute: 1 10*3/uL (ref 0.1–1.0)
Monocytes Relative: 8 %
Neutro Abs: 7.9 10*3/uL — ABNORMAL HIGH (ref 1.7–7.7)
Neutrophils Relative %: 64 %
Platelets: 260 10*3/uL (ref 150–400)
RBC: 5.38 MIL/uL (ref 4.22–5.81)
RDW: 14.5 % (ref 11.5–15.5)
WBC: 12.3 10*3/uL — ABNORMAL HIGH (ref 4.0–10.5)
nRBC: 0 % (ref 0.0–0.2)

## 2023-10-28 LAB — COMPREHENSIVE METABOLIC PANEL
ALT: 24 U/L (ref 0–44)
AST: 27 U/L (ref 15–41)
Albumin: 4.1 g/dL (ref 3.5–5.0)
Alkaline Phosphatase: 61 U/L (ref 38–126)
Anion gap: 6 (ref 5–15)
BUN: 12 mg/dL (ref 6–20)
CO2: 26 mmol/L (ref 22–32)
Calcium: 8.9 mg/dL (ref 8.9–10.3)
Chloride: 107 mmol/L (ref 98–111)
Creatinine, Ser: 0.88 mg/dL (ref 0.61–1.24)
GFR, Estimated: >60 mL/min (ref >60)
Glucose, Bld: 101 mg/dL — ABNORMAL HIGH (ref 70–99)
Potassium: 4 mmol/L (ref 3.5–5.1)
Sodium: 139 mmol/L (ref 135–145)
Total Bilirubin: 0.6 mg/dL (ref 0.0–1.2)
Total Protein: 7.7 g/dL (ref 6.5–8.1)

## 2023-10-28 LAB — TROPONIN I (HIGH SENSITIVITY): Troponin I (High Sensitivity): 4 ng/L (ref <18)

## 2023-10-28 MED ORDER — IPRATROPIUM-ALBUTEROL 0.5-2.5 (3) MG/3ML IN SOLN
3.0000 mL | Freq: Once | RESPIRATORY_TRACT | Status: AC
  Administered 2023-10-28: 3 mL via RESPIRATORY_TRACT
  Filled 2023-10-28: qty 3

## 2023-10-28 MED ORDER — DEXAMETHASONE SODIUM PHOSPHATE 10 MG/ML IJ SOLN
10.0000 mg | Freq: Once | INTRAMUSCULAR | Status: AC
Start: 2023-10-28 — End: 2023-10-28
  Administered 2023-10-28: 10 mg via INTRAMUSCULAR
  Filled 2023-10-28: qty 1

## 2023-10-28 NOTE — ED Provider Notes (Signed)
 [facility] Provider Note    Event Date/Time   First MD Initiated Contact with Patient 10/28/23 1829     (approximate)   History   Seizures   HPI  BELDON NOWLING is a 43 y.o. male with a history of atrial fibrillation hypertension, seizures, tobacco abuse who presents with complaints of mild shortness of breath pleurisy, possible seizure.  Patient is upset because he says he has been "sick all winter long ".  Review of medical records demonstrates he was seen here several times for various reasons, most recently for influenza A followed by cough, was treated with steroids but he reports he did not improve at that time.  Continues to smoke cigarettes     Physical Exam   Triage Vital Signs: ED Triage Vitals  Encounter Vitals Group     BP 10/28/23 1634 (!) 125/95     Systolic BP Percentile --      Diastolic BP Percentile --      Pulse Rate 10/28/23 1634 100     Resp 10/28/23 1634 18     Temp 10/28/23 1634 98.4 F (36.9 C)     Temp Source 10/28/23 1634 Oral     SpO2 10/28/23 1634 100 %     Weight --      Height --      Head Circumference --      Peak Flow --      Pain Score 10/28/23 1635 6     Pain Loc --      Pain Education --      Exclude from Growth Chart --     Most recent vital signs: Vitals:   10/28/23 1634  BP: (!) 125/95  Pulse: 100  Resp: 18  Temp: 98.4 F (36.9 C)  SpO2: 100%     General: Awake, no distress.  CV:  Good peripheral perfusion.  Resp:  Normal effort.  Scattered wheezing on exam Abd:  No distention.     ED Results / Procedures / Treatments   Labs (all labs ordered are listed, but only abnormal results are displayed) Labs Reviewed  CBC WITH DIFFERENTIAL/PLATELET - Abnormal; Notable for the following components:      Result Value   WBC 12.3 (*)    Neutro Abs 7.9 (*)    All other components within normal limits  COMPREHENSIVE METABOLIC PANEL - Abnormal; Notable for the following components:   Glucose,  Bld 101 (*)    All other components within normal limits  TROPONIN I (HIGH SENSITIVITY)     EKG  ED ECG REPORT I, Jene Every, the attending physician, personally viewed and interpreted this ECG.  Date: 10/28/2023  Rhythm: normal sinus rhythm QRS Axis: normal Intervals: normal ST/T Wave abnormalities: normal Narrative Interpretation: no evidence of acute ischemia    RADIOLOGY     PROCEDURES:  Critical Care performed:   Procedures   MEDICATIONS ORDERED IN ED: Medications  ipratropium-albuterol (DUONEB) 0.5-2.5 (3) MG/3ML nebulizer solution 3 mL (3 mLs Nebulization Given 10/28/23 1842)  ipratropium-albuterol (DUONEB) 0.5-2.5 (3) MG/3ML nebulizer solution 3 mL (3 mLs Nebulization Given 10/28/23 1841)  dexamethasone (DECADRON) injection 10 mg (10 mg Intramuscular Given 10/28/23 1853)     IMPRESSION / MDM / ASSESSMENT AND PLAN / ED COURSE  I reviewed the triage vital signs and the nursing notes. Patient's presentation is most consistent with exacerbation of chronic illness.  Patient with a history of seizure disorder reports compliance with his medications.  Questionable seizure today  however no postictal period.  Primary complaint is continued mild shortness of breath, intermittent chest tightness.  He is quite frustrated about this.  He does continue to smoke cigarettes daily.  Lab work reviewed and is overall reassuring, lungs with scattered wheezing, will treat with DuoNebs, Decadron injection  Improvement on lung exam after DuoNebs, will refer the patient to pulmonology given the chronicity of his complaints      FINAL CLINICAL IMPRESSION(S) / ED DIAGNOSES   Final diagnoses:  Bronchospasm     Rx / DC Orders   ED Discharge Orders     None        Note:  This document was prepared using Dragon voice recognition software and may include unintentional dictation errors.   Jene Every, MD 10/28/23 1919

## 2023-10-28 NOTE — ED Triage Notes (Addendum)
 Pt arrives from home for possible seizure. Family says he went to lay down because he wasn't feeling well after being diagnosed with bronchitis. They were unable to arouse him so they were concerned for seizure. EMS says he woke when they moved him to their stretcher. Pt was axox4 for them. He was able to move independently from the bed to their stretcher.  Pt says the last thing he remembers was talking to his brother. Pt says he is compliant with his medications. Pt is endorsing some chest pain with radiation to R arm. Pt endorses hx of afib. Pt reports last seizure "a few months ago."   CBG 129

## 2023-11-23 ENCOUNTER — Encounter: Payer: Self-pay | Admitting: Orthopedic Surgery

## 2023-11-24 ENCOUNTER — Ambulatory Visit
Admission: RE | Admit: 2023-11-24 | Discharge: 2023-11-24 | Disposition: A | Discharge status: Home / Self Care | Source: Ambulatory Visit | Attending: Orthopedic Surgery | Admitting: Orthopedic Surgery

## 2023-11-24 DIAGNOSIS — S83231A Complex tear of medial meniscus, current injury, right knee, initial encounter: Secondary | ICD-10-CM

## 2023-11-26 ENCOUNTER — Other Ambulatory Visit: Payer: Self-pay

## 2023-11-26 ENCOUNTER — Emergency Department
Admission: EM | Admit: 2023-11-26 | Discharge: 2023-11-26 | Discharge status: Against Medical Advice | Attending: Emergency Medicine | Admitting: Emergency Medicine

## 2023-11-26 DIAGNOSIS — W1842XA Slipping, tripping and stumbling without falling due to stepping into hole or opening, initial encounter: Secondary | ICD-10-CM | POA: Insufficient documentation

## 2023-11-26 DIAGNOSIS — Z5321 Procedure and treatment not carried out due to patient leaving prior to being seen by health care provider: Secondary | ICD-10-CM | POA: Diagnosis not present

## 2023-11-26 DIAGNOSIS — M25562 Pain in left knee: Secondary | ICD-10-CM | POA: Diagnosis present

## 2023-11-26 NOTE — ED Triage Notes (Signed)
 Pt states he tripped in a hole a few hours ago and injured his left knee.

## 2023-12-28 ENCOUNTER — Emergency Department
Admission: EM | Admit: 2023-12-28 | Discharge: 2023-12-28 | Disposition: A | Discharge status: Home / Self Care | Attending: Emergency Medicine | Admitting: Emergency Medicine

## 2023-12-28 ENCOUNTER — Other Ambulatory Visit: Payer: Self-pay

## 2023-12-28 DIAGNOSIS — W5911XA Bitten by nonvenomous snake, initial encounter: Secondary | ICD-10-CM

## 2023-12-28 DIAGNOSIS — I10 Essential (primary) hypertension: Secondary | ICD-10-CM | POA: Insufficient documentation

## 2023-12-28 DIAGNOSIS — S8992XA Unspecified injury of left lower leg, initial encounter: Secondary | ICD-10-CM | POA: Diagnosis present

## 2023-12-28 DIAGNOSIS — S81832A Puncture wound without foreign body, left lower leg, initial encounter: Secondary | ICD-10-CM | POA: Diagnosis not present

## 2023-12-28 DIAGNOSIS — Z23 Encounter for immunization: Secondary | ICD-10-CM | POA: Diagnosis not present

## 2023-12-28 MED ORDER — TETANUS-DIPHTH-ACELL PERTUSSIS 5-2.5-18.5 LF-MCG/0.5 IM SUSY
0.5000 mL | PREFILLED_SYRINGE | Freq: Once | INTRAMUSCULAR | Status: AC
Start: 2023-12-28 — End: 2023-12-28
  Administered 2023-12-28: 0.5 mL via INTRAMUSCULAR
  Filled 2023-12-28: qty 0.5

## 2023-12-28 MED ORDER — OXYCODONE-ACETAMINOPHEN 5-325 MG PO TABS
1.0000 | ORAL_TABLET | Freq: Once | ORAL | Status: AC
  Administered 2023-12-28: 1 via ORAL
  Filled 2023-12-28: qty 1

## 2023-12-28 MED ORDER — ONDANSETRON 4 MG PO TBDP
4.0000 mg | ORAL_TABLET | Freq: Once | ORAL | Status: AC
Start: 2023-12-28 — End: 2023-12-28
  Administered 2023-12-28: 4 mg via ORAL
  Filled 2023-12-28: qty 1

## 2023-12-28 NOTE — ED Notes (Signed)
Pt discharge information reviewed. Pt understands need for follow up care and when to return if symptoms worsen. All questions answered. Pt is alert and oriented with even and regular respirations. Pt is seen ambulating out of department with strong steady gait.   °

## 2023-12-28 NOTE — ED Notes (Signed)
 Pt states his left ankle started tingling about 10 min ago. Provider notified

## 2023-12-28 NOTE — ED Triage Notes (Signed)
 Pt reports he was outside and was bitten by what he thinks was a snake. Pt has a single red dot to left lower ankle. Pt reports burning at site. Minimal inflammation at area. Pt reports he did not see the snake but has been bitten before and states this feels similar.

## 2023-12-28 NOTE — Discharge Instructions (Signed)
 As we discussed please use Tylenol  or ibuprofen every 6 hours as needed for discomfort.  You may apply ice to this area for 20 to 30 minutes every 2 hours to help with pain or swelling.  If you notice any significant swelling redness or significant increase in pain please return to the emergency department for recheck/reevaluation.

## 2023-12-28 NOTE — ED Provider Notes (Signed)
 [facility] Provider Note    Event Date/Time   First MD Initiated Contact with Patient 12/28/23 0112     (approximate)  History   Chief Complaint: Snake Bite  HPI  Jeremy Yoder is a 43 y.o. male with a past medical history of hypertension, presents to the emergency department for possible snakebite.  According to the patient around 11:30 PM tonight he was taking his dog out.  He states he felt a sharp pain to his left lower extremity.  Patient states he has been bit by a snake previously and he feels like this felt similar.  Patient states he looked around at the time and did not see any snake.  Patient noticed a single small area that could be consistent with a small puncture to the left lower extremity just above the left ankle so he came to the emergency department for evaluation.  Physical Exam   Triage Vital Signs: ED Triage Vitals  Encounter Vitals Group     BP 12/28/23 0013 (!) 154/89     Systolic BP Percentile --      Diastolic BP Percentile --      Pulse Rate 12/28/23 0013 (!) 110     Resp 12/28/23 0013 19     Temp 12/28/23 0013 98.5 F (36.9 C)     Temp src --      SpO2 12/28/23 0013 98 %     Weight 12/28/23 0012 183 lb (83 kg)     Height 12/28/23 0012 6' (1.829 m)     Head Circumference --      Peak Flow --      Pain Score 12/28/23 0012 5     Pain Loc --      Pain Education --      Exclude from Growth Chart --     Most recent vital signs: Vitals:   12/28/23 0013  BP: (!) 154/89  Pulse: (!) 110  Resp: 19  Temp: 98.5 F (36.9 C)  SpO2: 98%    General: Awake, no distress.  CV:  Good peripheral perfusion. Resp:  Normal effort.   Other:  Patient is left lower extremity shows no swelling, no erythema.  There is a single small punctate red area just above the left ankle that could possibly be consistent with a small puncture wound.  He does state tenderness to this area but there is no swelling no erythema.   ED Results /  Procedures / Treatments   MEDICATIONS ORDERED IN ED: Medications - No data to display   IMPRESSION / MDM / ASSESSMENT AND PLAN / ED COURSE  I reviewed the triage vital signs and the nursing notes.  Patient's presentation is most consistent with acute, uncomplicated illness.  Patient presents to the emergency department for possible snakebite.  Patient states he felt a sharp pain to the left ankle/foot while taking his dog outside.  He looked around the area and did not see any snakes, but states he has been bit by a snake before and this felt similar.  There is a single small area that could be consistent with a small puncture wound just above the left ankle that is tender to palpation but is not erythematous there is no swelling.  Will monitor in the emergency department for an hour or so looking for any swelling or worsening pain to indicate a venomous snake bite.  Otherwise we will discharge the patient home have him continue to monitor this at home  for any possible swelling erythema or increased pain.  Patient agreeable to plan.  Patient does not know when his last tetanus shot was we will update in the emergency department.  Patient has been in the emergency department nearly 2 and half hours.  There is no appreciable swelling no erythema surrounding the bite.  Tetanus shot updated.  We will discharge the patient he will continue to monitor this at home.  Discussed return precautions for any worsening pain swelling or redness.  Patient agreeable to plan.  FINAL CLINICAL IMPRESSION(S) / ED DIAGNOSES   Snakebite    Note:  This document was prepared using Dragon voice recognition software and may include unintentional dictation errors.   Ruth Cove, MD 12/28/23 [phone]

## 2024-01-27 ENCOUNTER — Emergency Department
Admission: EM | Admit: 2024-01-27 | Discharge: 2024-01-27 | Disposition: A | Discharge status: Home / Self Care | Attending: Emergency Medicine | Admitting: Emergency Medicine

## 2024-01-27 ENCOUNTER — Emergency Department

## 2024-01-27 ENCOUNTER — Other Ambulatory Visit: Payer: Self-pay

## 2024-01-27 DIAGNOSIS — Z720 Tobacco use: Secondary | ICD-10-CM | POA: Insufficient documentation

## 2024-01-27 DIAGNOSIS — S5002XA Contusion of left elbow, initial encounter: Secondary | ICD-10-CM | POA: Insufficient documentation

## 2024-01-27 DIAGNOSIS — Y9351 Activity, roller skating (inline) and skateboarding: Secondary | ICD-10-CM | POA: Diagnosis not present

## 2024-01-27 DIAGNOSIS — I10 Essential (primary) hypertension: Secondary | ICD-10-CM | POA: Insufficient documentation

## 2024-01-27 NOTE — ED Triage Notes (Signed)
 Pt reports he took his children skating and he fell on his left elbow. Pt reports he took some pain relievers. Pt talks in complete sentences no respiratory distress noted

## 2024-01-27 NOTE — ED Provider Notes (Signed)
 [facility] Provider Note   Event Date/Time   First MD Initiated Contact with Patient 01/27/24 0100     (approximate) History  Arm Injury  HPI Jeremy Yoder is a 43 y.o. male with a past medical history of hypertension, paroxysmal A-fib, tobacco abuse, and epilepsy who presents complaining of left elbow pain after falling onto it while skating with his son.  Patient states that he has occasional shooting pains from the elbow down the forearm and is having decreased grip strength.  No deformity noted ROS: Patient currently denies any vision changes, tinnitus, difficulty speaking, facial droop, sore throat, chest pain, shortness of breath, abdominal pain, nausea/vomiting/diarrhea, dysuria, or weakness/numbness/paresthesias in any extremity   Physical Exam  Triage Vital Signs: ED Triage Vitals  Encounter Vitals Group     BP 01/27/24 0031 120/85     Girls Systolic BP Percentile --      Girls Diastolic BP Percentile --      Boys Systolic BP Percentile --      Boys Diastolic BP Percentile --      Pulse --      Resp 01/27/24 0031 16     Temp 01/27/24 0031 98.5 F (36.9 C)     Temp Source 01/27/24 0031 Oral     SpO2 01/27/24 0031 95 %     Weight 01/27/24 0032 180 lb (81.6 kg)     Height 01/27/24 0032 6' (1.829 m)     Head Circumference --      Peak Flow --      Pain Score 01/27/24 0032 8     Pain Loc --      Pain Education --      Exclude from Growth Chart --    Most recent vital signs: Vitals:   01/27/24 0031  BP: 120/85  Resp: 16  Temp: 98.5 F (36.9 C)  SpO2: 95%   General: Awake, oriented x4. CV:  Good peripheral perfusion. Resp:  Normal effort. Abd:  No distention. Other:  Middle-aged well-developed, well-nourished Caucasian male resting comfortably in no acute distress.  Tenderness to palpation over the lateral left elbow.  Grip strength mildly decreased on the left ED Results / Procedures / Treatments  Labs (all labs ordered are listed,  but only abnormal results are displayed) Labs Reviewed - No data to display RADIOLOGY ED MD interpretation: X-ray of the left elbow shows no evidence of acute abnormalities - All radiology independently interpreted and agree with radiology assessment Official radiology report(s): DG Elbow Complete Left Result Date: 01/27/2024 EXAM: 3 VIEW(S) XRAY OF THE LEFT ELBOW COMPARISON: None available. CLINICAL HISTORY: Left elbow pain. PER ER NOTE; Pt reports he took his children skating and he fell on his left elbow. Pt reports he took some pain relievers. Pt talks in complete sentences no respiratory distress noted. FINDINGS: BONES AND JOINTS: No acute fracture. No focal osseous lesion. No joint dislocation. SOFT TISSUES: The soft tissues are unremarkable. IMPRESSION: 1. No acute abnormality. Electronically signed by: Zadie Herter MD 01/27/2024 01:34 AM EDT RP Workstation: [id number]   PROCEDURES: Critical Care performed: No Procedures MEDICATIONS ORDERED IN ED: Medications - No data to display IMPRESSION / MDM / ASSESSMENT AND PLAN / ED COURSE  I reviewed the triage vital signs and the nursing notes.                             The patient is on the cardiac monitor  to evaluate for evidence of arrhythmia and/or significant heart rate changes. Patient's presentation is most consistent with acute presentation with potential threat to life or bodily function. 43 year old male with the above-stated past medical history presents for left elbow pain after falling onto the side while skating Given history, exam and workup I have low suspicion for fracture, dislocation, significant ligamentous injury, septic arthritis, gout flare, new autoimmune arthropathy, or gonococcal arthropathy.  Interventions: X-ray of the left elbow shows no evidence of acute abnormalities Disposition: Discharge home with strict return precautions and instructions for prompt primary care follow up in the next week.   FINAL  CLINICAL IMPRESSION(S) / ED DIAGNOSES   Final diagnoses:  Contusion of left elbow, initial encounter   Rx / DC Orders   ED Discharge Orders     None      Note:  This document was prepared using Dragon voice recognition software and may include unintentional dictation errors.   Teren Franckowiak K, MD 01/27/24 [phone]

## 2024-02-29 ENCOUNTER — Encounter: Payer: Self-pay | Admitting: Emergency Medicine

## 2024-02-29 ENCOUNTER — Emergency Department

## 2024-02-29 ENCOUNTER — Other Ambulatory Visit: Payer: Self-pay

## 2024-02-29 DIAGNOSIS — D72829 Elevated white blood cell count, unspecified: Secondary | ICD-10-CM | POA: Insufficient documentation

## 2024-02-29 DIAGNOSIS — X30XXXA Exposure to excessive natural heat, initial encounter: Secondary | ICD-10-CM | POA: Diagnosis not present

## 2024-02-29 DIAGNOSIS — E876 Hypokalemia: Secondary | ICD-10-CM | POA: Insufficient documentation

## 2024-02-29 DIAGNOSIS — E860 Dehydration: Secondary | ICD-10-CM | POA: Insufficient documentation

## 2024-02-29 DIAGNOSIS — N179 Acute kidney failure, unspecified: Secondary | ICD-10-CM | POA: Diagnosis not present

## 2024-02-29 DIAGNOSIS — R071 Chest pain on breathing: Secondary | ICD-10-CM | POA: Diagnosis present

## 2024-02-29 NOTE — ED Notes (Signed)
 Blue, green, and lav sent to lab

## 2024-02-29 NOTE — ED Triage Notes (Signed)
 Pt reports BUE and BLE cramping x 2 days, nausea, and intermittent blurry vision. Pt reports being out in the heat the past couple days and does not feel like himself. Pt reports pain w/ inspiration that started today.

## 2024-03-01 ENCOUNTER — Emergency Department
Admission: EM | Admit: 2024-03-01 | Discharge: 2024-03-01 | Disposition: A | Discharge status: Home / Self Care | Attending: Emergency Medicine | Admitting: Emergency Medicine

## 2024-03-01 DIAGNOSIS — N179 Acute kidney failure, unspecified: Secondary | ICD-10-CM

## 2024-03-01 DIAGNOSIS — E860 Dehydration: Secondary | ICD-10-CM

## 2024-03-01 LAB — CBC
HCT: 51.3 % (ref 39.0–52.0)
Hemoglobin: 17.6 g/dL — ABNORMAL HIGH (ref 13.0–17.0)
MCH: 31.7 pg (ref 26.0–34.0)
MCHC: 34.3 g/dL (ref 30.0–36.0)
MCV: 92.4 fL (ref 80.0–100.0)
Platelets: 245 10*3/uL (ref 150–400)
RBC: 5.55 MIL/uL (ref 4.22–5.81)
RDW: 13.2 % (ref 11.5–15.5)
WBC: 15.5 10*3/uL — ABNORMAL HIGH (ref 4.0–10.5)
nRBC: 0 % (ref 0.0–0.2)

## 2024-03-01 LAB — BASIC METABOLIC PANEL WITH GFR
Anion gap: 8 (ref 5–15)
BUN: 16 mg/dL (ref 6–20)
CO2: 25 mmol/L (ref 22–32)
Calcium: 9 mg/dL (ref 8.9–10.3)
Chloride: 105 mmol/L (ref 98–111)
Creatinine, Ser: 1.52 mg/dL — ABNORMAL HIGH (ref 0.61–1.24)
GFR, Estimated: 58 mL/min — ABNORMAL LOW (ref >60)
Glucose, Bld: 86 mg/dL (ref 70–99)
Potassium: 5.3 mmol/L — ABNORMAL HIGH (ref 3.5–5.1)
Sodium: 138 mmol/L (ref 135–145)

## 2024-03-01 LAB — TROPONIN I (HIGH SENSITIVITY)
Troponin I (High Sensitivity): 12 ng/L (ref <18)
Troponin I (High Sensitivity): 9 ng/L (ref <18)

## 2024-03-01 LAB — D-DIMER, QUANTITATIVE: D-Dimer, Quant: <0.27 ug{FEU}/mL (ref 0.00–0.50)

## 2024-03-01 LAB — MAGNESIUM: Magnesium: 2 mg/dL (ref 1.7–2.4)

## 2024-03-01 MED ORDER — LACTATED RINGERS IV BOLUS
2000.0000 mL | Freq: Once | INTRAVENOUS | Status: AC
  Administered 2024-03-01: 2000 mL via INTRAVENOUS

## 2024-03-01 MED ORDER — KETOROLAC TROMETHAMINE 15 MG/ML IJ SOLN
15.0000 mg | Freq: Once | INTRAMUSCULAR | Status: AC
  Administered 2024-03-01: 15 mg via INTRAVENOUS
  Filled 2024-03-01: qty 1

## 2024-03-01 NOTE — Discharge Instructions (Signed)
 Your labs today showed signs of dehydration likely from significant heat exposure and donating plasma.  Please make sure you are staying hydrated at home.  Otherwise laboratory workup was reassuring.  Please follow-up with your primary care provider as needed or return for any severe or worsening symptoms.

## 2024-03-01 NOTE — ED Provider Notes (Signed)
 [facility] Provider Note    Event Date/Time   First MD Initiated Contact with Patient 03/01/24 [phone]     (approximate)   History   Generalized Body Aches and Nausea   HPI Jeremy Yoder is a 43 y.o. male presenting today for generalized body pain.  Patient states he was outside working in the heat for 2 straight days as well as donated plasma earlier today.  He noted muscle cramping throughout his body, 1 episode of chest pain, and nausea.  Also had some pain when he took a deep breath.  Also notes chronic swelling to his left lower extremity.  States he was not drinking well and concern for dehydration.     Physical Exam   Triage Vital Signs: ED Triage Vitals  Encounter Vitals Group     BP 02/29/24 2341 115/80     Girls Systolic BP Percentile --      Girls Diastolic BP Percentile --      Boys Systolic BP Percentile --      Boys Diastolic BP Percentile --      Pulse Rate 02/29/24 2341 89     Resp 02/29/24 2341 18     Temp 02/29/24 2341 98 F (36.7 C)     Temp Source 02/29/24 2341 Oral     SpO2 02/29/24 2341 100 %     Weight 02/29/24 2342 180 lb (81.6 kg)     Height 02/29/24 2342 6' (1.829 m)     Head Circumference --      Peak Flow --      Pain Score --      Pain Loc --      Pain Education --      Exclude from Growth Chart --     Most recent vital signs: Vitals:   02/29/24 2341  BP: 115/80  Pulse: 89  Resp: 18  Temp: 98 F (36.7 C)  SpO2: 100%   I have reviewed the vital signs. General:  Awake, alert, no acute distress. Head:  Normocephalic, Atraumatic. EENT:  PERRL, EOMI, Oral mucosa pink and moist, Neck is supple. Cardiovascular: Regular rate, 2+ distal pulses. Respiratory:  Normal respiratory effort, symmetrical expansion, no distress.   Extremities:  Moving all four extremities through full ROM without pain.  No significant swelling appreciated to left lower extremity at this time. Neuro:  Alert and oriented.  Interacting  appropriately.   Skin:  Warm, dry, no rash.   Psych: Appropriate affect.    ED Results / Procedures / Treatments   Labs (all labs ordered are listed, but only abnormal results are displayed) Labs Reviewed  BASIC METABOLIC PANEL WITH GFR - Abnormal; Notable for the following components:      Result Value   Potassium 5.3 (*)    Creatinine, Ser 1.52 (*)    GFR, Estimated 58 (*)    All other components within normal limits  CBC - Abnormal; Notable for the following components:   WBC 15.5 (*)    Hemoglobin 17.6 (*)    All other components within normal limits  D-DIMER, QUANTITATIVE  MAGNESIUM   TROPONIN I (HIGH SENSITIVITY)  TROPONIN I (HIGH SENSITIVITY)     EKG My EKG interpretation: Rate of 88, normal sinus rhythm, normal axis, normal intervals.  No acute ST elevations or depressions   RADIOLOGY Independently interpreted chest x-ray with no acute pathology   PROCEDURES:  Critical Care performed: No  Procedures   MEDICATIONS ORDERED IN ED: Medications  lactated  ringers  bolus 2,000 mL (2,000 mLs Intravenous New Bag/Given 03/01/24 0119)  ketorolac  (TORADOL ) 15 MG/ML injection 15 mg (15 mg Intravenous Given 03/01/24 0119)     IMPRESSION / MDM / ASSESSMENT AND PLAN / ED COURSE  I reviewed the triage vital signs and the nursing notes.                              Differential diagnosis includes, but is not limited to, dehydration, electrolyte abnormality, AKI, low concern for ACS or DVT  Patient's presentation is most consistent with acute complicated illness / injury requiring diagnostic workup.  Patient is a 43 year old male presenting today for generalized body cramping, fatigue, lightheadedness, and dehydration.  Symptoms appear most consistent with heat exhaustion.  Physical exam otherwise reassuring with stable vital signs.  EKG unremarkable.  CBC shows evidence of hemoconcentration with elevated WBC and hemoglobin.  BMP with notable AKI at 1.52 and mild  hypokalemia at 5.3.  Will give 2 L of fluid and Toradol  for symptoms.  Patient was reassessed with complete resolution of symptoms and feeling back to baseline.  He wants to go home at this time.  No ongoing life-threatening pathology and will discharge with PCP follow-up.  The patient is on the cardiac monitor to evaluate for evidence of arrhythmia and/or significant heart rate changes. Clinical Course as of 03/01/24 0303  Sat Mar 01, 2024  0300 Feeling better and tolerating p.o.  Will discharge at this time [DW]    Clinical Course User Index [DW] Malvina Alm DASEN, MD     FINAL CLINICAL IMPRESSION(S) / ED DIAGNOSES   Final diagnoses:  Dehydration  AKI (acute kidney injury) (HCC)     Rx / DC Orders   ED Discharge Orders     None        Note:  This document was prepared using Dragon voice recognition software and may include unintentional dictation errors.   Malvina Alm DASEN, MD 03/01/24 [phone]

## 2024-03-01 NOTE — ED Notes (Signed)
Pt attempting PO trial at this time. 

## 2024-04-01 ENCOUNTER — Emergency Department
Admission: EM | Admit: 2024-04-01 | Discharge: 2024-04-01 | Disposition: A | Discharge status: Home / Self Care | Attending: Emergency Medicine | Admitting: Emergency Medicine

## 2024-04-01 ENCOUNTER — Other Ambulatory Visit: Payer: Self-pay

## 2024-04-01 ENCOUNTER — Emergency Department

## 2024-04-01 DIAGNOSIS — R0789 Other chest pain: Secondary | ICD-10-CM | POA: Diagnosis not present

## 2024-04-01 DIAGNOSIS — R0602 Shortness of breath: Secondary | ICD-10-CM | POA: Insufficient documentation

## 2024-04-01 DIAGNOSIS — I10 Essential (primary) hypertension: Secondary | ICD-10-CM | POA: Diagnosis not present

## 2024-04-01 DIAGNOSIS — R079 Chest pain, unspecified: Secondary | ICD-10-CM

## 2024-04-01 LAB — RESP PANEL BY RT-PCR (RSV, FLU A&B, COVID)  RVPGX2
Influenza A by PCR: NEGATIVE
Influenza B by PCR: NEGATIVE
Resp Syncytial Virus by PCR: NEGATIVE
SARS Coronavirus 2 by RT PCR: NEGATIVE

## 2024-04-01 LAB — BASIC METABOLIC PANEL WITH GFR
Anion gap: 9 (ref 5–15)
BUN: 10 mg/dL (ref 6–20)
CO2: 28 mmol/L (ref 22–32)
Calcium: 9.1 mg/dL (ref 8.9–10.3)
Chloride: 105 mmol/L (ref 98–111)
Creatinine, Ser: 0.86 mg/dL (ref 0.61–1.24)
GFR, Estimated: >60 mL/min (ref >60)
Glucose, Bld: 86 mg/dL (ref 70–99)
Potassium: 3.9 mmol/L (ref 3.5–5.1)
Sodium: 142 mmol/L (ref 135–145)

## 2024-04-01 LAB — CBC
HCT: 46.1 % (ref 39.0–52.0)
Hemoglobin: 15.6 g/dL (ref 13.0–17.0)
MCH: 31.1 pg (ref 26.0–34.0)
MCHC: 33.8 g/dL (ref 30.0–36.0)
MCV: 92 fL (ref 80.0–100.0)
Platelets: 221 K/uL (ref 150–400)
RBC: 5.01 MIL/uL (ref 4.22–5.81)
RDW: 13.4 % (ref 11.5–15.5)
WBC: 10.6 K/uL — ABNORMAL HIGH (ref 4.0–10.5)
nRBC: 0 % (ref 0.0–0.2)

## 2024-04-01 LAB — TROPONIN I (HIGH SENSITIVITY): Troponin I (High Sensitivity): 3 ng/L (ref <18)

## 2024-04-01 MED ORDER — LAMOTRIGINE 100 MG PO TABS: 100.0000 mg | ORAL_TABLET | Freq: Once | ORAL | Status: DC

## 2024-04-01 NOTE — Discharge Instructions (Signed)
You were seen in the Emergency Department today for evaluation of your chest pain. Fortunately, your labs, EKG, and chest x- were overall reassuring against a emergency cause for your pain. Please follow-up with your primary doctor within the next few days for reevaluation. You can take Tylenol and ibuprofen as needed for your pain, unless there is another reason that you should not take these. Return to the ER for any new or worsening symptoms including worsening chest pain, difficulty breathing, or any other new or concerning symptoms that you believe warrants immediate attention.   

## 2024-04-01 NOTE — ED Provider Notes (Signed)
 [facility] Provider Note    Event Date/Time   First MD Initiated Contact with Patient 04/01/24 [phone]     (approximate)   History   Shortness of Breath   HPI  Jeremy Yoder is a 43 year old male with history of HTN, A-fib, seizure disorder presenting to the ER for evaluation of chest pain.  Patient reports that for the past few days he has had cough with associated shortness of breath and chest pain. No lower extremity swelling.  No noted fevers.  Does additionally report that he had a seizure this morning that he reports is similar to prior, but unable to tell me further details. Unsure how often he has seizures, says sometimes he has several in a month.  Does feel back at his baseline.  Reports compliance with his medication, but is unsure what he takes.  No known sick contacts.     Physical Exam   Triage Vital Signs: ED Triage Vitals  Encounter Vitals Group     BP 04/01/24 0835 (!) 154/102     Girls Systolic BP Percentile --      Girls Diastolic BP Percentile --      Boys Systolic BP Percentile --      Boys Diastolic BP Percentile --      Pulse Rate 04/01/24 0835 87     Resp 04/01/24 0835 19     Temp 04/01/24 0835 98 F (36.7 C)     Temp src --      SpO2 04/01/24 0835 100 %     Weight 04/01/24 0836 180 lb (81.6 kg)     Height 04/01/24 0836 6' (1.829 m)     Head Circumference --      Peak Flow --      Pain Score 04/01/24 0835 5     Pain Loc --      Pain Education --      Exclude from Growth Chart --     Most recent vital signs: Vitals:   04/01/24 0835  BP: (!) 154/102  Pulse: 87  Resp: 19  Temp: 98 F (36.7 C)  SpO2: 100%     General: Awake, interactive  CV:  Regular rate, good peripheral perfusion.  Resp:  Unlabored respirations, lungs clear to auscultation Chest wall: No significant tenderness to palpation Abd:  Nondistended.  Neuro:  Symmetric facial movement, fluid speech, moving all extremities spontaneously and  equally   ED Results / Procedures / Treatments   Labs (all labs ordered are listed, but only abnormal results are displayed) Labs Reviewed  CBC - Abnormal; Notable for the following components:      Result Value   WBC 10.6 (*)    All other components within normal limits  RESP PANEL BY RT-PCR (RSV, FLU A&B, COVID)  RVPGX2  BASIC METABOLIC PANEL WITH GFR  TROPONIN I (HIGH SENSITIVITY)     EKG EKG independently reviewed and interpreted by myself demonstrates:  EKG demonstrates normal sinus rhythm at a rate of 80, PR 150, QRS 84, QTc 422, no acute ST changes  RADIOLOGY Imaging independently reviewed and interpreted by myself demonstrates:  CXR without focal consolidation  Formal Radiology Read:  DG Chest 2 View Result Date: 04/01/2024 CLINICAL DATA:  Chest pain and shortness of breath EXAM: CHEST - 2 VIEW COMPARISON:  03/01/2024 FINDINGS: Cardiomediastinal silhouette and pulmonary vasculature are within normal limits. Lungs are clear. IMPRESSION: No acute cardiopulmonary process. Electronically Signed   By: Aliene Katha HERO.D.  On: 04/01/2024 09:08    PROCEDURES:  Critical Care performed: No  Procedures   MEDICATIONS ORDERED IN ED: Medications - No data to display   IMPRESSION / MDM / ASSESSMENT AND PLAN / ED COURSE  I reviewed the triage vital signs and the nursing notes.  Differential diagnosis includes, but is not limited to, pneumonia, viral illness, pneumothorax, ACS, low risk PE and PERC negative, very low suspicion aortic dissection based on history  Patient's presentation is most consistent with acute presentation with potential threat to life or bodily function.  43 year old male presenting with chest pain and shortness of breath.  Hypertensive but otherwise stable vitals on presentation.  Will obtain labs, x-Sahej Schrieber to further evaluate.  EKG without acute ischemic findings.  CXR without focal consolidation.  Labs reassuring.  Minimal leukocytosis.  Normal BMP.   Negative troponin and well over 3 hours of symptoms.  Low risk heart score.  Viral panel negative.  Patient reassessed and updated on results of workup.  Given his associated upper respiratory symptoms, suspect possible viral illness causing patient's symptoms.  He was offered symptomatic treatment here, but declined.  He is comfortable with discharge home.  Discussed report of care.  Strict return precautions provided.  Patient discharged in stable condition.        FINAL CLINICAL IMPRESSION(S) / ED DIAGNOSES   Final diagnoses:  Shortness of breath  Nonspecific chest pain     Rx / DC Orders   ED Discharge Orders     None        Note:  This document was prepared using Dragon voice recognition software and may include unintentional dictation errors.   Levander Slate, MD 04/01/24 1041

## 2024-04-01 NOTE — ED Triage Notes (Signed)
 PT comes in via pov with complaints of intermittent chest pain and shortness of breath on inhalation that started a couple days ago. Pt states that he has a history of seizure, and had a seizure this morning before coming to be seen in the ER. Pt states that he is over all not feeling well. Pt reports being complaint with all medications. Pt with no signs of acute distress at this time.

## 2024-05-03 ENCOUNTER — Other Ambulatory Visit: Payer: Self-pay

## 2024-05-03 ENCOUNTER — Emergency Department

## 2024-05-03 ENCOUNTER — Emergency Department
Admission: EM | Admit: 2024-05-03 | Discharge: 2024-05-04 | Disposition: A | Discharge status: Home / Self Care | Attending: Emergency Medicine | Admitting: Emergency Medicine

## 2024-05-03 DIAGNOSIS — R519 Headache, unspecified: Secondary | ICD-10-CM | POA: Diagnosis not present

## 2024-05-03 DIAGNOSIS — F17200 Nicotine dependence, unspecified, uncomplicated: Secondary | ICD-10-CM | POA: Diagnosis not present

## 2024-05-03 DIAGNOSIS — R569 Unspecified convulsions: Secondary | ICD-10-CM | POA: Diagnosis present

## 2024-05-03 DIAGNOSIS — I10 Essential (primary) hypertension: Secondary | ICD-10-CM | POA: Insufficient documentation

## 2024-05-03 DIAGNOSIS — Z79899 Other long term (current) drug therapy: Secondary | ICD-10-CM | POA: Diagnosis not present

## 2024-05-03 LAB — CBC WITH DIFFERENTIAL/PLATELET
Abs Immature Granulocytes: 0.04 K/uL (ref 0.00–0.07)
Basophils Absolute: 0.1 K/uL (ref 0.0–0.1)
Basophils Relative: 1 %
Eosinophils Absolute: 0.7 K/uL — ABNORMAL HIGH (ref 0.0–0.5)
Eosinophils Relative: 6 %
HCT: 43.9 % (ref 39.0–52.0)
Hemoglobin: 14.8 g/dL (ref 13.0–17.0)
Immature Granulocytes: 0 %
Lymphocytes Relative: 31 %
Lymphs Abs: 3.4 K/uL (ref 0.7–4.0)
MCH: 31.3 pg (ref 26.0–34.0)
MCHC: 33.7 g/dL (ref 30.0–36.0)
MCV: 92.8 fL (ref 80.0–100.0)
Monocytes Absolute: 1 K/uL (ref 0.1–1.0)
Monocytes Relative: 9 %
Neutro Abs: 5.7 K/uL (ref 1.7–7.7)
Neutrophils Relative %: 53 %
Platelets: 215 K/uL (ref 150–400)
RBC: 4.73 MIL/uL (ref 4.22–5.81)
RDW: 13.6 % (ref 11.5–15.5)
WBC: 10.8 K/uL — ABNORMAL HIGH (ref 4.0–10.5)
nRBC: 0 % (ref 0.0–0.2)

## 2024-05-03 LAB — COMPREHENSIVE METABOLIC PANEL WITH GFR
ALT: 15 U/L (ref 0–44)
AST: 21 U/L (ref 15–41)
Albumin: 3.4 g/dL — ABNORMAL LOW (ref 3.5–5.0)
Alkaline Phosphatase: 51 U/L (ref 38–126)
Anion gap: 8 (ref 5–15)
BUN: 13 mg/dL (ref 6–20)
CO2: 26 mmol/L (ref 22–32)
Calcium: 8.2 mg/dL — ABNORMAL LOW (ref 8.9–10.3)
Chloride: 107 mmol/L (ref 98–111)
Creatinine, Ser: 0.94 mg/dL (ref 0.61–1.24)
GFR, Estimated: >60 mL/min (ref >60)
Glucose, Bld: 105 mg/dL — ABNORMAL HIGH (ref 70–99)
Potassium: 3.4 mmol/L — ABNORMAL LOW (ref 3.5–5.1)
Sodium: 141 mmol/L (ref 135–145)
Total Bilirubin: 0.4 mg/dL (ref 0.0–1.2)
Total Protein: 6.3 g/dL — ABNORMAL LOW (ref 6.5–8.1)

## 2024-05-03 LAB — TROPONIN I (HIGH SENSITIVITY): Troponin I (High Sensitivity): 3 ng/L (ref <18)

## 2024-05-03 MED ORDER — LEVETIRACETAM (KEPPRA) 500 MG/5 ML ADULT IV PUSH
1000.0000 mg | Freq: Once | INTRAVENOUS | Status: AC
  Administered 2024-05-03: 1000 mg via INTRAVENOUS
  Filled 2024-05-03: qty 10

## 2024-05-03 MED ORDER — KETOROLAC TROMETHAMINE 30 MG/ML IJ SOLN
30.0000 mg | Freq: Once | INTRAMUSCULAR | Status: AC
  Administered 2024-05-03: 30 mg via INTRAVENOUS
  Filled 2024-05-03: qty 1

## 2024-05-03 NOTE — ED Provider Notes (Signed)
 [facility] Provider Note    Event Date/Time   First MD Initiated Contact with Patient 05/03/24 2316     (approximate)   History   Seizures   HPI  Jeremy Yoder is a 43 y.o. male with history of seizures on Keppra , hypertension, atrial fibrillation, history of medical noncompliance who presents to the emergency department with seizure-like activity.  Per EMS he had witnessed 10-minute tonic-clonic seizure-like activity.  Seizure stopped spontaneously.  Not currently postictal.  Reports he has missed some Keppra  in the last couple of weeks.  Denies fevers, cough.  Is having chest pain and shortness of breath.  Does complain of some headache now.  No numbness, tingling or weakness.  No vomiting or diarrhea.  Denies urinary symptoms.  Denies drug or alcohol use.   History provided by patient and EMS.    Past Medical History:  Diagnosis Date   A-fib [facility])    Hypertension    a. Noncompliant w/ meds.   Seizures (HCC)    a. Dx in teens. Noncompliant w/ meds.   Tobacco abuse     Past Surgical History:  Procedure Laterality Date   arm     EYE SURGERY     FOREARM SURGERY     KNEE SURGERY      MEDICATIONS:  Prior to Admission medications   Medication Sig Start Date End Date Taking? Authorizing Provider  albuterol  (VENTOLIN  HFA) 108 (90 Base) MCG/ACT inhaler Inhale 2 puffs into the lungs every 6 (six) hours as needed for wheezing or shortness of breath. 10/03/23   Cleaster Tinnie LABOR, PA-C  amLODipine  (NORVASC ) 5 MG tablet Take 1 tablet (5 mg total) by mouth daily. 03/21/19 03/20/20  Lang Dover, MD  benzonatate  (TESSALON  PERLES) 100 MG capsule Take 1 capsule (100 mg total) by mouth 3 (three) times daily as needed for cough. 10/03/23 10/02/24  Cleaster Tinnie LABOR, PA-C  dicyclomine  (BENTYL ) 10 MG capsule Take 1 capsule (10 mg total) by mouth 3 (three) times daily as needed for up to 14 days for spasms. or abdominal pain 08/17/18 08/31/18  Gordan Huxley, MD   HYDROcodone -acetaminophen  (NORCO) 5-325 MG tablet Take 1 tablet by mouth every 6 (six) hours as needed for moderate pain. 03/22/23   Charlene Debby BROCKS, PA-C  levETIRAcetam  (KEPPRA ) 500 MG tablet Take 1 tablet (500 mg total) by mouth 2 (two) times daily. 03/20/18   Gordan Huxley, MD  metoprolol  succinate (TOPROL -XL) 50 MG 24 hr tablet Take 1 tablet (50 mg total) by mouth daily. Take with or immediately following a meal. 12/26/18   Gollan, Evalene PARAS, MD  predniSONE  (DELTASONE ) 50 MG tablet Take one tablet per day for 4 days. 10/03/23   Cleaster Tinnie LABOR, PA-C    Physical Exam   Triage Vital Signs: ED Triage Vitals [05/03/24 2316]  Encounter Vitals Group     BP      Girls Systolic BP Percentile      Girls Diastolic BP Percentile      Boys Systolic BP Percentile      Boys Diastolic BP Percentile      Pulse Rate 87     Resp 18     Temp 98.4 F (36.9 C)     Temp Source Oral     SpO2 100 %     Weight 189 lb (85.7 kg)     Height 6' (1.829 m)     Head Circumference      Peak Flow  Pain Score 7     Pain Loc      Pain Education      Exclude from Growth Chart     Most recent vital signs: Vitals:   05/03/24 2316 05/04/24 0328  BP: 105/67 110/89  Pulse: 87 71  Resp: 18 16  Temp: 98.4 F (36.9 C) 97.7 F (36.5 C)  SpO2: 100% 99%    CONSTITUTIONAL: Alert, responds appropriately to questions. Well-appearing; well-nourished HEAD: Normocephalic, atraumatic EYES: Conjunctivae clear, pupils appear equal, sclera nonicteric ENT: normal nose; moist mucous membranes NECK: Supple, normal ROM, no meningismus, no midline spinal tenderness or step-off or deformity CARD: RRR; S1 and S2 appreciated RESP: Normal chest excursion without splinting or tachypnea; breath sounds clear and equal bilaterally; no wheezes, no rhonchi, no rales, no hypoxia or respiratory distress, speaking full sentences ABD/GI: Non-distended; soft, non-tender, no rebound, no guarding, no peritoneal signs BACK: The back  appears normal EXT: Normal ROM in all joints; no deformity noted, no edema SKIN: Normal color for age and race; warm; no rash on exposed skin NEURO: Moves all extremities equally, normal speech, normal sensation, no facial asymmetry PSYCH: The patient's mood and manner are appropriate.   ED Results / Procedures / Treatments   LABS: (all labs ordered are listed, but only abnormal results are displayed) Labs Reviewed  CBC WITH DIFFERENTIAL/PLATELET - Abnormal; Notable for the following components:      Result Value   WBC 10.8 (*)    Eosinophils Absolute 0.7 (*)    All other components within normal limits  COMPREHENSIVE METABOLIC PANEL WITH GFR - Abnormal; Notable for the following components:   Potassium 3.4 (*)    Glucose, Bld 105 (*)    Calcium 8.2 (*)    Total Protein 6.3 (*)    Albumin 3.4 (*)    All other components within normal limits  RESP PANEL BY RT-PCR (RSV, FLU A&B, COVID)  RVPGX2  LEVETIRACETAM  LEVEL  TROPONIN I (HIGH SENSITIVITY)  TROPONIN I (HIGH SENSITIVITY)     EKG:  EKG Interpretation Date/Time:  Saturday May 03 2024 23:18:11 EDT Ventricular Rate:  86 PR Interval:  124 QRS Duration:  96 QT Interval:  378 QTC Calculation: 453 R Axis:   71  Text Interpretation: Sinus rhythm Confirmed by Neomi Neptune [phone]) on 05/03/2024 11:23:07 PM         RADIOLOGY: My personal review and interpretation of imaging: Chest x-ray clear.  CT head unremarkable.  I have personally reviewed all radiology reports.   DG Chest Portable 1 View Result Date: 05/03/2024 CLINICAL DATA:  Chest pain shortness of breath EXAM: PORTABLE CHEST 1 VIEW COMPARISON:  Chest x-ray 04/01/2024 FINDINGS: The heart size and mediastinal contours are within normal limits. Both lungs are clear. The visualized skeletal structures are unremarkable. IMPRESSION: No active disease. Electronically Signed   By: Greig Pique M.D.   On: 05/03/2024 23:45   CT HEAD WO CONTRAST ( ) Result Date:  05/03/2024 EXAM: CT HEAD WITHOUT CONTRAST 05/03/2024 11:38:00 PM TECHNIQUE: CT of the head was performed without the administration of intravenous contrast. Automated exposure control, iterative reconstruction, and/or weight based adjustment of the mA/kV was utilized to reduce the radiation dose to as low as reasonably achievable. COMPARISON: 03/01/2019 CLINICAL HISTORY: Head trauma, moderate-severe. Pt with witnessed full tonic clonic seizure lasting approx 10 min. Hx of same. Complaint with all medications. BGL 156. Not post ictal at this time. Broke the seizure without intervention. Currently endorsing feeling like crap and chest pain. Endorsing SOB  prior to seizure. No recent changes in medications. FINDINGS: BRAIN AND VENTRICLES: No acute hemorrhage. No evidence of acute infarct. No hydrocephalus. No extra-axial collection. No mass effect or midline shift. ORBITS: No acute abnormality. SINUSES: No acute abnormality. SOFT TISSUES AND SKULL: No acute soft tissue abnormality. No skull fracture. IMPRESSION: 1. No acute intracranial abnormality. Electronically signed by: Franky Stanford MD 05/03/2024 11:41 PM EDT RP Workstation: [id number]     PROCEDURES:  Critical Care performed: No   CRITICAL CARE Performed by: Josette Sink   Total critical care time: 0 minutes  Critical care time was exclusive of separately billable procedures and treating other patients.  Critical care was necessary to treat or prevent imminent or life-threatening deterioration.  Critical care was time spent personally by me on the following activities: development of treatment plan with patient and/or surrogate as well as nursing, discussions with consultants, evaluation of patient's response to treatment, examination of patient, obtaining history from patient or surrogate, ordering and performing treatments and interventions, ordering and review of laboratory studies, ordering and review of radiographic studies, pulse  oximetry and re-evaluation of patient's condition.   Procedures    IMPRESSION / MDM / ASSESSMENT AND PLAN / ED COURSE  I reviewed the triage vital signs and the nursing notes.    Patient here after seizure.  Now neuro intact.  Complaining of headache, chest pain.  The patient is on the cardiac monitor to evaluate for evidence of arrhythmia and/or significant heart rate changes.   DIFFERENTIAL DIAGNOSIS (includes but not limited to):   Seizure, intracranial hemorrhage, medication noncompliance, electrolyte derangement, hypoglycemia, UTI, ACS, pneumonia, CHF, pneumothorax, less likely PE, dissection, stroke   Patient's presentation is most consistent with acute presentation with potential threat to life or bodily function.   PLAN: Will obtain cardiac labs, urine, EKG, chest x-ray, CT head.  Will load with IV Keppra .  Will give Toradol  for pain.   MEDICATIONS GIVEN IN ED: Medications  ketorolac  (TORADOL ) 30 MG/ML injection 30 mg (30 mg Intravenous Given 05/03/24 2330)  levETIRAcetam  (KEPPRA ) undiluted injection 1,000 mg (1,000 mg Intravenous Given 05/03/24 2330)  potassium chloride  SA (KLOR-CON  M) CR tablet 40 mEq (40 mEq Oral Given 05/04/24 0034)     ED COURSE: Troponin x 2 negative.  Normal hemoglobin, electrolytes, glucose.  Chest x-ray reviewed and interpreted by myself and radiologist and is unremarkable.  Head CT also unremarkable for acute abnormalities.  EKG nonischemic.  No events noted on cardiac monitoring.  Patient has been resting comfortably without further seizure-like activity and remains hemodynamically stable and neurologically intact.  Have attempted to get a urine sample from him multiple times to rule out UTI, substance use disorder as a triggering cause for his seizure today however he declines multiple times to provide us  with a urine sample and declines catheterization.  Will discharge home.  Will have him follow-up with his PCP and have also given neurology  follow-up.  No indication at this time to increase his Keppra  dose as I suspect that his medical noncompliance is likely the reason he had a breakthrough seizure today.  Have advised him that he cannot drive for 6 months after his last seizure per [place]  state law.   At this time, I do not feel there is any life-threatening condition present. I reviewed all nursing notes, vitals, pertinent previous records.  All lab and urine results, EKGs, imaging ordered have been independently reviewed and interpreted by myself.  I reviewed all available radiology reports from any imaging  ordered this visit.  Based on my assessment, I feel the patient is safe to be discharged home without further emergent workup and can continue workup as an outpatient as needed. Discussed all findings, treatment plan as well as usual and customary return precautions.  They verbalize understanding and are comfortable with this plan.  Outpatient follow-up has been provided as needed.  All questions have been answered.    CONSULTS: Admission considered but workup reassuring, patient neurologically stable/intact and appropriate for outpatient management.   OUTSIDE RECORDS REVIEWED: Reviewed patient's last neurology note in October 2020.       FINAL CLINICAL IMPRESSION(S) / ED DIAGNOSES   Final diagnoses:  Seizure-like activity (HCC)     Rx / DC Orders   ED Discharge Orders          Ordered    levETIRAcetam  (KEPPRA ) 500 MG tablet  2 times daily        05/04/24 0318             Note:  This document was prepared using Dragon voice recognition software and may include unintentional dictation errors.   Uday Jantz, Josette SAILOR, DO 05/04/24 1014

## 2024-05-03 NOTE — ED Triage Notes (Addendum)
 Pt with witnessed full tonic clonic seizure lasting approx 10 min. Hx of same. Complaint with all medications. BGL 156. Not post ictal at this time. Broke the seizure without intervention. Currently endorsing feeling like crap and chest pain. Endorsing SOB prior to seizure. No recent changes in medications

## 2024-05-04 LAB — TROPONIN I (HIGH SENSITIVITY): Troponin I (High Sensitivity): 3 ng/L (ref <18)

## 2024-05-04 LAB — RESP PANEL BY RT-PCR (RSV, FLU A&B, COVID)  RVPGX2
Influenza A by PCR: NEGATIVE
Influenza B by PCR: NEGATIVE
Resp Syncytial Virus by PCR: NEGATIVE
SARS Coronavirus 2 by RT PCR: NEGATIVE

## 2024-05-04 MED ORDER — LEVETIRACETAM 500 MG PO TABS: 500.0000 mg | ORAL_TABLET | Freq: Two times a day (BID) | ORAL | 0 refills | Status: DC

## 2024-05-04 MED ORDER — POTASSIUM CHLORIDE CRYS ER 20 MEQ PO TBCR
40.0000 meq | EXTENDED_RELEASE_TABLET | Freq: Once | ORAL | Status: AC
  Administered 2024-05-04: 40 meq via ORAL
  Filled 2024-05-04: qty 2

## 2024-05-04 NOTE — Discharge Instructions (Addendum)
 Please take your Keppra  as prescribed.  I have given you follow-up information for neurology.   No driving for 6 months after most recent seizure or spell, per [place] law.   Avoid activities that are potentially dangerous if you were to have another seizure, including operating heavy machinery, swimming, taking baths, climbing heights. Please use direct supervision around stoves, ovens, fireplaces, campfires, or other sources of heat or fire.    Make sure you are taking medications as directed, avoiding alcohol and drug use, getting adequate sleep and eating regular meals.

## 2024-05-06 LAB — LEVETIRACETAM LEVEL: Levetiracetam Lvl: <2 ug/mL — ABNORMAL LOW (ref 10.0–40.0)

## 2024-05-24 ENCOUNTER — Other Ambulatory Visit: Payer: Self-pay

## 2024-05-24 ENCOUNTER — Emergency Department

## 2024-05-24 ENCOUNTER — Observation Stay
Admission: EM | Admit: 2024-05-24 | Discharge: 2024-05-25 | Disposition: A | Discharge status: Home / Self Care | Attending: Obstetrics and Gynecology | Admitting: Obstetrics and Gynecology

## 2024-05-24 DIAGNOSIS — I480 Paroxysmal atrial fibrillation: Secondary | ICD-10-CM | POA: Diagnosis not present

## 2024-05-24 DIAGNOSIS — I10 Essential (primary) hypertension: Secondary | ICD-10-CM | POA: Diagnosis present

## 2024-05-24 DIAGNOSIS — G40909 Epilepsy, unspecified, not intractable, without status epilepticus: Secondary | ICD-10-CM | POA: Insufficient documentation

## 2024-05-24 DIAGNOSIS — F319 Bipolar disorder, unspecified: Secondary | ICD-10-CM | POA: Diagnosis not present

## 2024-05-24 DIAGNOSIS — F17200 Nicotine dependence, unspecified, uncomplicated: Secondary | ICD-10-CM | POA: Diagnosis present

## 2024-05-24 DIAGNOSIS — E785 Hyperlipidemia, unspecified: Secondary | ICD-10-CM | POA: Diagnosis present

## 2024-05-24 DIAGNOSIS — R079 Chest pain, unspecified: Principal | ICD-10-CM | POA: Diagnosis present

## 2024-05-24 DIAGNOSIS — F17210 Nicotine dependence, cigarettes, uncomplicated: Secondary | ICD-10-CM | POA: Diagnosis not present

## 2024-05-24 DIAGNOSIS — Z79899 Other long term (current) drug therapy: Secondary | ICD-10-CM | POA: Diagnosis not present

## 2024-05-24 DIAGNOSIS — R0789 Other chest pain: Principal | ICD-10-CM | POA: Insufficient documentation

## 2024-05-24 LAB — BASIC METABOLIC PANEL WITH GFR
Anion gap: 12 (ref 5–15)
BUN: 13 mg/dL (ref 6–20)
CO2: 25 mmol/L (ref 22–32)
Calcium: 9.1 mg/dL (ref 8.9–10.3)
Chloride: 104 mmol/L (ref 98–111)
Creatinine, Ser: 0.88 mg/dL (ref 0.61–1.24)
GFR, Estimated: >60 mL/min (ref >60)
Glucose, Bld: 92 mg/dL (ref 70–99)
Potassium: 3.6 mmol/L (ref 3.5–5.1)
Sodium: 141 mmol/L (ref 135–145)

## 2024-05-24 LAB — TROPONIN I (HIGH SENSITIVITY): Troponin I (High Sensitivity): 4 ng/L (ref <18)

## 2024-05-24 MED ORDER — IOHEXOL 350 MG/ML SOLN
75.0000 mL | Freq: Once | INTRAVENOUS | Status: AC | PRN
  Administered 2024-05-24: 75 mL via INTRAVENOUS

## 2024-05-24 MED ORDER — SODIUM CHLORIDE 0.9 % IV BOLUS
1000.0000 mL | Freq: Once | INTRAVENOUS | Status: AC
  Administered 2024-05-25: 1000 mL via INTRAVENOUS

## 2024-05-24 MED ORDER — NITROGLYCERIN 0.4 MG SL SUBL
0.4000 mg | SUBLINGUAL_TABLET | SUBLINGUAL | Status: DC | PRN
  Administered 2024-05-25: 0.4 mg via SUBLINGUAL
  Filled 2024-05-24: qty 1

## 2024-05-24 NOTE — ED Triage Notes (Signed)
 Pt ambulatory into triage w/ c/o central chest pain for 2 days. Pt states he has a hx of A-fib. Pt endorses pain when taking deep breaths.

## 2024-05-24 NOTE — ED Notes (Signed)
 Patient taken to xray at this time.

## 2024-05-24 NOTE — ED Provider Notes (Signed)
 11:05 PM  Assumed care at shift change.  Patient here with complaints of chest pain.  History of hypertension, tobacco use, family history of brother with recent heart attack in his 25s.  EKG shows sinus tachycardia but no ischemia.  Troponins, CTA chest pending.  Initial provider recommended admission for cardiac workup.  12:34 AM  Pt still having chest pain.  Nitroglycerin has been ordered but patient states he has not received it yet.  Will discuss with nursing staff.  CTA of the chest reviewed and interpreted by myself and the radiologist and shows no acute abnormality.  First troponin negative.  Hemodynamically stable, mildly hypertensive.  Prior provider recommended admission for cardiac workup.  Patient on board with this plan.  Discussed with Dr. Sim with hospitalist service for admission.  He will see patient.   Jeremy Yoder, Jeremy SAILOR, DO 05/25/24 [phone]
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
## 2024-05-24 NOTE — ED Provider Notes (Signed)
 [facility] Provider Note    Event Date/Time   First MD Initiated Contact with Patient 05/24/24 2157     (approximate)   History   Chief Complaint: Chest Pain   HPI  Jeremy Yoder is a 43 y.o. male with history of hypertension, paroxysmal atrial fibrillation who comes ED complaining of central chest pain for the past 2 days.  Feels sharp, it hurts to take a deep breath.  Also associated with shortness of breath.  Also worse with exertion and patient reports dyspnea on exertion along with episodes of diaphoresis.  Brother at bedside recently had NSTEMI and PCI.  Patient is a current smoker.        Past Medical History:  Diagnosis Date   A-fib [facility])    Hypertension    a. Noncompliant w/ meds.   Seizures (HCC)    a. Dx in teens. Noncompliant w/ meds.   Tobacco abuse     Current Outpatient Rx   Order #: [id number] Class: Normal   Order #: [id number] Class: Normal   Order #: [id number] Class: Normal   Order #: [id number] Class: Print   Order #: [id number] Class: Normal   Order #: [id number] Class: Normal   Order #: [id number] Class: Normal   Order #: [id number] Class: Normal    Past Surgical History:  Procedure Laterality Date   arm     EYE SURGERY     FOREARM SURGERY     KNEE SURGERY      Physical Exam   Triage Vital Signs: ED Triage Vitals  Encounter Vitals Group     BP 05/24/24 2129 (!) 152/105     Girls Systolic BP Percentile --      Girls Diastolic BP Percentile --      Boys Systolic BP Percentile --      Boys Diastolic BP Percentile --      Pulse Rate 05/24/24 2129 (!) 107     Resp 05/24/24 2129 20     Temp 05/24/24 2129 98.7 F (37.1 C)     Temp src --      SpO2 05/24/24 2129 100 %     Weight 05/24/24 2125 182 lb (82.6 kg)     Height 05/24/24 2125 6' (1.829 m)     Head Circumference --      Peak Flow --      Pain Score 05/24/24 2124 6     Pain Loc --      Pain Education --      Exclude from Growth Chart --     Most recent  vital signs: Vitals:   05/24/24 2129  BP: (!) 152/105  Pulse: (!) 107  Resp: 20  Temp: 98.7 F (37.1 C)  SpO2: 100%    General: Awake, no distress.  CV:  Good peripheral perfusion.  Tachycardia heart rate 105.  Symmetric distal pulses Resp:  Normal effort.  Good air entry bilaterally.  Diffuse expiratory wheezing, mildly prolonged expiratory phase Abd:  No distention.  Soft nontender Other:  No lower extremity edema   ED Results / Procedures / Treatments   Labs (all labs ordered are listed, but only abnormal results are displayed) Labs Reviewed  CBC  BASIC METABOLIC PANEL WITH GFR  TROPONIN I (HIGH SENSITIVITY)  TROPONIN I (HIGH SENSITIVITY)     EKG Interpreted by me Sinus tachycardia rate 103.  Normal axis intervals QRS ST segments T waves   RADIOLOGY Chest x-ray interpreted by me, normal.  Radiology report reviewed   PROCEDURES:  Procedures  MEDICATIONS ORDERED IN ED: Medications  sodium chloride  0.9 % bolus 1,000 mL (has no administration in time range)  nitroGLYCERIN (NITROSTAT) SL tablet 0.4 mg (has no administration in time range)     IMPRESSION / MDM / ASSESSMENT AND PLAN / ED COURSE  I reviewed the triage vital signs and the nursing notes.  DDx: NSTEMI, pulmonary embolism, pneumothorax, pneumonia  Patient's presentation is most consistent with acute presentation with potential threat to life or bodily function.  Patient presents with 2 days of central chest pain, sinus tachycardia.  Blood pressure is okay.  Patient rates the pain as severe.  With pleuritic and exertional components, will obtain lab workup, CT chest, plan for hospitalization for further cardiac workup given his elevated risk profile.       FINAL CLINICAL IMPRESSION(S) / ED DIAGNOSES   Final diagnoses:  Chest pain with moderate risk for cardiac etiology     Rx / DC Orders   ED Discharge Orders     None        Note:  This document was prepared using Dragon voice  recognition software and may include unintentional dictation errors.   Viviann Pastor, MD 05/24/24 2318

## 2024-05-25 ENCOUNTER — Observation Stay: Admit: 2024-05-25

## 2024-05-25 DIAGNOSIS — F17210 Nicotine dependence, cigarettes, uncomplicated: Secondary | ICD-10-CM | POA: Insufficient documentation

## 2024-05-25 DIAGNOSIS — R079 Chest pain, unspecified: Secondary | ICD-10-CM | POA: Diagnosis not present

## 2024-05-25 DIAGNOSIS — G40909 Epilepsy, unspecified, not intractable, without status epilepticus: Secondary | ICD-10-CM | POA: Diagnosis not present

## 2024-05-25 DIAGNOSIS — E78019 Familial hypercholesterolemia, unspecified: Secondary | ICD-10-CM

## 2024-05-25 DIAGNOSIS — I10 Essential (primary) hypertension: Secondary | ICD-10-CM | POA: Diagnosis not present

## 2024-05-25 LAB — CBC
HCT: 47.8 % (ref 39.0–52.0)
HCT: 43 % (ref 39.0–52.0)
Hemoglobin: 16.1 g/dL (ref 13.0–17.0)
Hemoglobin: 14.1 g/dL (ref 13.0–17.0)
MCH: 31.3 pg (ref 26.0–34.0)
MCH: 30.7 pg (ref 26.0–34.0)
MCHC: 33.7 g/dL (ref 30.0–36.0)
MCHC: 32.8 g/dL (ref 30.0–36.0)
MCV: 92.8 fL (ref 80.0–100.0)
MCV: 93.7 fL (ref 80.0–100.0)
Platelets: 205 K/uL (ref 150–400)
Platelets: 188 K/uL (ref 150–400)
RBC: 5.15 MIL/uL (ref 4.22–5.81)
RBC: 4.59 MIL/uL (ref 4.22–5.81)
RDW: 13.1 % (ref 11.5–15.5)
RDW: 13.2 % (ref 11.5–15.5)
WBC: 10.8 K/uL — AB (ref 4.0–10.5)
WBC: 9.1 K/uL (ref 4.0–10.5)
nRBC: 0 % (ref 0.0–0.2)
nRBC: 0 % (ref 0.0–0.2)

## 2024-05-25 LAB — HIV ANTIBODY (ROUTINE TESTING W REFLEX): HIV Screen 4th Generation wRfx: NONREACTIVE

## 2024-05-25 LAB — TROPONIN I (HIGH SENSITIVITY)
Troponin I (High Sensitivity): 4 ng/L (ref <18)
Troponin I (High Sensitivity): 4 ng/L (ref <18)

## 2024-05-25 LAB — CREATININE, SERUM
Creatinine, Ser: 0.78 mg/dL (ref 0.61–1.24)
GFR, Estimated: >60 mL/min (ref >60)

## 2024-05-25 MED ORDER — ONDANSETRON HCL 4 MG/2ML IJ SOLN: 4.0000 mg | Freq: Four times a day (QID) | INTRAMUSCULAR | Status: DC | PRN

## 2024-05-25 MED ORDER — LACTATED RINGERS IV SOLN: INTRAVENOUS | Status: DC

## 2024-05-25 MED ORDER — METOPROLOL SUCCINATE ER 50 MG PO TB24: 50.0000 mg | ORAL_TABLET | Freq: Every day | ORAL | Status: DC

## 2024-05-25 MED ORDER — LEVETIRACETAM 500 MG PO TABS: 500.0000 mg | ORAL_TABLET | Freq: Two times a day (BID) | ORAL | Status: DC

## 2024-05-25 MED ORDER — ALBUTEROL SULFATE (2.5 MG/3ML) 0.083% IN NEBU: 2.5000 mg | INHALATION_SOLUTION | Freq: Four times a day (QID) | RESPIRATORY_TRACT | Status: DC | PRN

## 2024-05-25 MED ORDER — NICOTINE 21 MG/24HR TD PT24: 21.0000 mg | MEDICATED_PATCH | Freq: Every day | TRANSDERMAL | Status: DC

## 2024-05-25 MED ORDER — ENOXAPARIN SODIUM 40 MG/0.4ML IJ SOSY: 40.0000 mg | PREFILLED_SYRINGE | INTRAMUSCULAR | Status: DC

## 2024-05-25 MED ORDER — ACETAMINOPHEN 325 MG PO TABS: 650.0000 mg | ORAL_TABLET | ORAL | Status: DC | PRN

## 2024-05-25 NOTE — H&P (Signed)
 History and Physical    Patient: Jeremy Yoder FMW:[id number] DOB: 10-21-1980 DOA: 05/24/2024 DOS: the patient was seen and examined on 05/25/2024 PCP: [facility]  Patient coming from: Home  Chief Complaint:  Chief Complaint  Patient presents with   Chest Pain   HPI: Jeremy Yoder is a 43 y.o. male with medical history significant of essential hypertension, hyperlipidemia, seizure disorder, tobacco abuse, history of paroxysmal atrial fibrillation not on treatment, family history of coronary artery disease with his brother recently getting CABG who presented to the ER with central chest pain on and off for the last 2 days.  Chest pain appears to be pleuritic but occasionally nonpleuritic.  No prior coronary artery disease.  Initial EKG and cardiac enzymes were negative.  Patient however has risk factors for coronary artery disease including noncompliance with current treatment.  As a result patient is being admitted to the hospital for rule out MI.  Review of Systems: As mentioned in the history of present illness. All other systems reviewed and are negative. Past Medical History:  Diagnosis Date   A-fib [facility])    Hypertension    a. Noncompliant w/ meds.   Seizures (HCC)    a. Dx in teens. Noncompliant w/ meds.   Tobacco abuse    Past Surgical History:  Procedure Laterality Date   arm     EYE SURGERY     FOREARM SURGERY     KNEE SURGERY     Social History:  reports that he has been smoking cigarettes. He has a 10 pack-year smoking history. He has never used smokeless tobacco. He reports that he does not drink alcohol and does not use drugs.  Allergies  Allergen Reactions   Aspirin Itching   Ibuprofen Itching    Family History  Problem Relation Age of Onset   Hypertension Mother    Diabetes Mother    Atrial fibrillation Mother    Heart disease Father        Pt doesn't know much about father's history but says he has either a PPM or ICD.    Prior to  Admission medications   Medication Sig Start Date End Date Taking? Authorizing Provider  albuterol  (VENTOLIN  HFA) 108 (90 Base) MCG/ACT inhaler Inhale 2 puffs into the lungs every 6 (six) hours as needed for wheezing or shortness of breath. 10/03/23   Cleaster Tinnie LABOR, PA-C  amLODipine  (NORVASC ) 5 MG tablet Take 1 tablet (5 mg total) by mouth daily. 03/21/19 03/20/20  Lang Dover, MD  benzonatate  (TESSALON  PERLES) 100 MG capsule Take 1 capsule (100 mg total) by mouth 3 (three) times daily as needed for cough. 10/03/23 10/02/24  Cleaster Tinnie LABOR, PA-C  dicyclomine  (BENTYL ) 10 MG capsule Take 1 capsule (10 mg total) by mouth 3 (three) times daily as needed for up to 14 days for spasms. or abdominal pain 08/17/18 08/31/18  Gordan Huxley, MD  HYDROcodone -acetaminophen  (NORCO) 5-325 MG tablet Take 1 tablet by mouth every 6 (six) hours as needed for moderate pain. 03/22/23   Charlene Debby BROCKS, PA-C  levETIRAcetam  (KEPPRA ) 500 MG tablet Take 1 tablet (500 mg total) by mouth 2 (two) times daily. 05/04/24   Ward, Josette SAILOR, DO  metoprolol  succinate (TOPROL -XL) 50 MG 24 hr tablet Take 1 tablet (50 mg total) by mouth daily. Take with or immediately following a meal. 12/26/18   Gollan, Evalene PARAS, MD  predniSONE  (DELTASONE ) 50 MG tablet Take one tablet per day for 4 days. 10/03/23  Cleaster Tinnie LABOR, PA-C    Physical Exam: Vitals:   05/24/24 2315 05/24/24 2330 05/25/24 0015 05/25/24 0030  BP:  (!) 146/98  (!) 139/105  Pulse: 88 86 84 78  Resp: (!) 22 18 12 13   Temp:      SpO2:      Weight:      Height:       Constitutional: Acutely ill looking, NAD, calm, comfortable Eyes: PERRL, lids and conjunctivae normal ENMT: Mucous membranes are moist. Posterior pharynx clear of any exudate or lesions.Normal dentition.  Neck: normal, supple, no masses, no thyromegaly Respiratory: clear to auscultation bilaterally, no wheezing, no crackles. Normal respiratory effort. No accessory muscle use.  Cardiovascular: Sinus  tachycardia, no murmurs / rubs / gallops. No extremity edema. 2+ pedal pulses. No carotid bruits.  Abdomen: no tenderness, no masses palpated. No hepatosplenomegaly. Bowel sounds positive.  Musculoskeletal: Good range of motion, no joint swelling or tenderness, Skin: no rashes, lesions, ulcers. No induration Neurologic: CN 2-12 grossly intact. Sensation intact, DTR normal. Strength 5/5 in all 4.  Psychiatric: Normal judgment and insight. Alert and oriented x 3. Normal mood  Data Reviewed:  Vitals appear stable.  Blood pressure 152/105, pulse 107 respirate of 22.  White count is 10.8,the rest of the CBC and chemistry appear to be normal.  Chest x-ray showed no acute findings.  CT angiogram of the chest showed no evidence of PE EKG showed sinus tachycardia with a rate of 103.  No significant ST changes.  Assessment and Plan:  #1 chest pain: Appears atypical.  Patient however has risk factors for coronary artery disease including hypertension, hyperlipidemia and family history.  Also tobacco abuse.  As a result we will admit the patient.  Cycle enzymes.  Initial troponin is only 4.  Patient will likely need risk stratification was stress testing.  This is due to strong family history and his current presentation.  Also echocardiogram.  Cardiology consult may be required.  #2 essential hypertension: Has not been compliant.  He was previously on metoprolol  and amlodipine .  Start metoprolol  and monitor.  #3 seizure disorder: Will continue Keppra .  Not sure patient takes it.  #4 hyperlipidemia: Will consider statin.  Check fasting lipid panel  #5 tobacco abuse: Counseling with nicotine patch  #6 paroxysmal atrial fibrillation: Patient is in sinus rhythm.  Could not find any A-fib records.  #7 bipolar disorder: Confirm on resume home regimen.    Advance Care Planning:   Code Status: Prior full code  Consults: None but may need cardiology consult in the morning  Family Communication:  Brother  Severity of Illness: The appropriate patient status for this patient is OBSERVATION. Observation status is judged to be reasonable and necessary in order to provide the required intensity of service to ensure the patient's safety. The patient's presenting symptoms, physical exam findings, and initial radiographic and laboratory data in the context of their medical condition is felt to place them at decreased risk for further clinical deterioration. Furthermore, it is anticipated that the patient will be medically stable for discharge from the hospital within 2 midnights of admission.   AuthorBETHA SIM KNOLL, MD 05/25/2024 12:37 AM  For on call review [email or web address].

## 2024-05-25 NOTE — ED Notes (Signed)
 Good morning. I just received this patient. He is asking to leave AMA and do the stress test outpatient. I explained to him the pros and cons of staying versus leaving AMA. Danajai, NT witnessed this conversation. Pt stated he still wanted to leave. Sent provider a secure chat.

## 2024-05-25 NOTE — ED Notes (Signed)
 Pt expressed desire to leave AMA , discussed with the nurse Zoila) the importance of staying, but he still desires to leave , nurse messaged the provider

## 2024-05-25 NOTE — Discharge Summary (Signed)
 Jeremy Yoder FMW:[id number] DOB: Apr 13, 1981 DOA: 05/24/2024 3 PCP: [name]  Admit date: 05/24/2024 Discharge date: 05/25/2024  Time spent: 35 minutes  Recommendations for Outpatient Follow-up:  Pcp f/u Cardiology (Dr. Perla) f/u     Discharge Diagnoses:  Principal Problem:   Chest pain Active Problems:   Paroxysmal atrial fibrillation (HCC)   Essential hypertension   Seizure disorder (HCC)   Bipolar affective disorder (HCC)   Hyperlipidemia   Nicotine dependence, uncomplicated   Discharge Condition: stable  Diet recommendation: heart heatlhy  Filed Weights   05/24/24 2125  Weight: 82.6 kg    History of present illness:  From admission h and p Jeremy Yoder is a 43 y.o. male with medical history significant of essential hypertension, hyperlipidemia, seizure disorder, tobacco abuse, history of paroxysmal atrial fibrillation not on treatment, family history of coronary artery disease with his brother recently getting CABG who presented to the ER with central chest pain on and off for the last 2 days.  Chest pain appears to be pleuritic but occasionally nonpleuritic.  No prior coronary artery disease.  Initial EKG and cardiac enzymes were negative.  Patient however has risk factors for coronary artery disease including noncompliance with current treatment.  As a result patient is being admitted to the hospital for rule out MI.   Hospital Course:   Patient presents for evaluation of chest pain. Currently chest pain free, hemodynamically stable. Has risk factors (family history, tobacco abuse, hypertension). Chest pain not strongly suggestive of ischemia (not exertional, long-lasting, doesn't radiate, no diaphoresis or nausea/vomiting) Here serial troponins normal (all 4s). EKG no signs ischemia. CTA shows evidence CAD, no CTA. Reviewed with Dr. Barbaraann of cardiology who advises no additional in-hospital w/u, does advise outpatient f/u and consideration of  coronary CT. Patient desires discharge and is in agreement with this plan.  Procedures: none   Consultations: Discussed with cardiology  Discharge Exam: Vitals:   05/25/24 0600 05/25/24 0754  BP: (!) 138/106   Pulse: 61   Resp: 16   Temp:    SpO2: 96% 100%    General: NAD Cardiovascular: RRR Respiratory: CTAB  Discharge Instructions   Discharge Instructions     Diet - low sodium heart healthy   Complete by: As directed    Increase activity slowly   Complete by: As directed       Allergies as of 05/25/2024       Reactions   Atorvastatin Calcium Other (See Comments)   Tiotropium Bromide    Other Reaction(s): mouth blisters   Aspirin Itching   Ibuprofen Itching        Medication List     STOP taking these medications    HYDROcodone -acetaminophen  5-325 MG tablet Commonly known as: Norco       TAKE these medications    albuterol  108 (90 Base) MCG/ACT inhaler Commonly known as: VENTOLIN  HFA Inhale 2 puffs into the lungs every 6 (six) hours as needed for wheezing or shortness of breath.   amLODipine  10 MG tablet Commonly known as: NORVASC  Take 10 mg by mouth daily.   fenofibrate 145 MG tablet Commonly known as: TRICOR Take 145 mg by mouth daily.   lamoTRIgine  100 MG 24 hour tablet Commonly known as: LAMICTAL  XR Take 100 mg by mouth daily.   lisinopril  20 MG tablet Commonly known as: ZESTRIL  Take 20 mg by mouth daily.   metoprolol  succinate 100 MG 24 hr tablet Commonly known as: TOPROL -XL Take 100 mg by mouth daily.  montelukast 10 MG tablet Commonly known as: SINGULAIR Take 10 mg by mouth daily.   rosuvastatin 20 MG tablet Commonly known as: CRESTOR Take 20 mg by mouth at bedtime.   valACYclovir 500 MG tablet Commonly known as: VALTREX Take 500 mg by mouth daily.       Allergies  Allergen Reactions   Atorvastatin Calcium Other (See Comments)   Tiotropium Bromide     Other Reaction(s): mouth blisters   Aspirin Itching    Ibuprofen Itching    Follow-up Information     [facility] Follow up.   Specialty: General Practice Contact information: [address] [place] [address] [phone]         Perla Evalene PARAS, MD Follow up.   Specialty: Cardiology Contact information: [address] [place] [address] [phone]                  The results of significant diagnostics from this hospitalization (including imaging, microbiology, ancillary and laboratory) are listed below for reference.    Significant Diagnostic Studies: CT Angio Chest PE W and/or Wo Contrast Result Date: 05/25/2024 CLINICAL DATA:  Pulmonary embolism (PE) suspected, high prob. Chest pain. EXAM: CT ANGIOGRAPHY CHEST WITH CONTRAST TECHNIQUE: Multidetector CT imaging of the chest was performed using the standard protocol during bolus administration of intravenous contrast. Multiplanar CT image reconstructions and MIPs were obtained to evaluate the vascular anatomy. RADIATION DOSE REDUCTION: This exam was performed according to the departmental dose-optimization program which includes automated exposure control, adjustment of the mA and/or kV according to patient size and/or use of iterative reconstruction technique. CONTRAST:  75mL OMNIPAQUE  IOHEXOL  350 MG/ML SOLN COMPARISON:  03/01/2019 FINDINGS: Cardiovascular: Heart is normal size. Aorta is normal caliber. Scattered coronary artery and aortic calcifications. No filling defects in the pulmonary arteries to suggest pulmonary emboli. Mediastinum/Nodes: No mediastinal, hilar, or axillary adenopathy. Trachea and esophagus are unremarkable. Thyroid  unremarkable. Lungs/Pleura: No confluent airspace opacities or effusions. Scarring lingula. Upper Abdomen: No acute findings Musculoskeletal: Chest wall soft tissues are unremarkable. No acute bony abnormality. Review of the MIP images confirms the above findings. IMPRESSION: No evidence of  pulmonary embolus. No acute cardiopulmonary disease. Coronary artery disease. Aortic Atherosclerosis (ICD10-I70.0). Electronically Signed   By: Franky Crease M.D.   On: 05/25/2024 00:07   DG Chest 2 View Result Date: 05/24/2024 CLINICAL DATA:  Chest pain EXAM: CHEST - 2 VIEW COMPARISON:  05/03/2024 FINDINGS: The heart size and mediastinal contours are within normal limits. Both lungs are clear. The visualized skeletal structures are unremarkable. IMPRESSION: No active cardiopulmonary disease. Electronically Signed   By: Franky Crease M.D.   On: 05/24/2024 21:54   DG Chest Portable 1 View Result Date: 05/03/2024 CLINICAL DATA:  Chest pain shortness of breath EXAM: PORTABLE CHEST 1 VIEW COMPARISON:  Chest x-ray 04/01/2024 FINDINGS: The heart size and mediastinal contours are within normal limits. Both lungs are clear. The visualized skeletal structures are unremarkable. IMPRESSION: No active disease. Electronically Signed   By: Greig Pique M.D.   On: 05/03/2024 23:45   CT HEAD WO CONTRAST ( ) Result Date: 05/03/2024 EXAM: CT HEAD WITHOUT CONTRAST 05/03/2024 11:38:00 PM TECHNIQUE: CT of the head was performed without the administration of intravenous contrast. Automated exposure control, iterative reconstruction, and/or weight based adjustment of the mA/kV was utilized to reduce the radiation dose to as low as reasonably achievable. COMPARISON: 03/01/2019 CLINICAL HISTORY: Head trauma, moderate-severe. Pt with witnessed full tonic clonic seizure lasting approx 10  min. Hx of same. Complaint with all medications. BGL 156. Not post ictal at this time. Broke the seizure without intervention. Currently endorsing feeling like crap and chest pain. Endorsing SOB prior to seizure. No recent changes in medications. FINDINGS: BRAIN AND VENTRICLES: No acute hemorrhage. No evidence of acute infarct. No hydrocephalus. No extra-axial collection. No mass effect or midline shift. ORBITS: No acute abnormality. SINUSES: No  acute abnormality. SOFT TISSUES AND SKULL: No acute soft tissue abnormality. No skull fracture. IMPRESSION: 1. No acute intracranial abnormality. Electronically signed by: Franky Stanford MD 05/03/2024 11:41 PM EDT RP Workstation: [id number]    Microbiology: No results found for this or any previous visit (from the past 240 hours).   Labs: Basic Metabolic Panel: Recent Labs  Lab 05/24/24 2130 05/25/24 0341  NA 141  --   K 3.6  --   CL 104  --   CO2 25  --   GLUCOSE 92  --   BUN 13  --   CREATININE 0.88 0.78  CALCIUM 9.1  --    Liver Function Tests: No results for input(s): AST, ALT, ALKPHOS, BILITOT, PROT, ALBUMIN in the last 168 hours. No results for input(s): LIPASE, AMYLASE in the last 168 hours. No results for input(s): AMMONIA in the last 168 hours. CBC: Recent Labs  Lab 05/24/24 2130 05/25/24 0341  WBC 10.8* 9.1  HGB 16.1 14.1  HCT 47.8 43.0  MCV 92.8 93.7  PLT 205 188   Cardiac Enzymes: No results for input(s): CKTOTAL, CKMB, CKMBINDEX, TROPONINI in the last 168 hours. BNP: BNP (last 3 results) No results for input(s): BNP in the last 8760 hours.  ProBNP (last 3 results) No results for input(s): PROBNP in the last 8760 hours.  CBG: No results for input(s): GLUCAP in the last 168 hours.     Signed:  Devaughn KATHEE Ban MD.  [facility] 05/25/2024, 8:47 AM

## 2024-05-26 ENCOUNTER — Other Ambulatory Visit: Payer: Self-pay | Admitting: Emergency Medicine

## 2024-05-26 DIAGNOSIS — R072 Precordial pain: Secondary | ICD-10-CM

## 2024-05-26 MED ORDER — METOPROLOL TARTRATE 100 MG PO TABS: 100.0000 mg | ORAL_TABLET | Freq: Once | ORAL | 0 refills | Status: DC

## 2024-05-26 NOTE — Addendum Note (Signed)
 Addended by: CRISTOPHER OLIVIA PARAS on: 05/26/2024 03:52 PM   Modules accepted: Orders

## 2024-05-27 ENCOUNTER — Telehealth: Payer: Self-pay | Admitting: Emergency Medicine

## 2024-05-27 NOTE — Telephone Encounter (Signed)
 Furth, Cadence H, PA-C  P Cv Div Burl Scheduling; P Cv Div Burl Triage Patient needs ER follow-up in 1-2 weeks and a cardiac CTA for chest pain. Thanks  Ct ordered. Called patient and left message for call back. Ct instructions sent via MyChart.

## 2024-05-30 ENCOUNTER — Telehealth (HOSPITAL_COMMUNITY): Payer: Self-pay | Admitting: Emergency Medicine

## 2024-05-30 NOTE — Telephone Encounter (Signed)
 Attempted to call patient regarding upcoming cardiac CT appointment. Left message on voicemail with name and callback number Rockwell Alexandria RN Navigator Cardiac Imaging [facility] [phone] Office [phone] Cell

## 2024-06-02 ENCOUNTER — Other Ambulatory Visit: Payer: Self-pay

## 2024-06-02 ENCOUNTER — Ambulatory Visit
Admission: RE | Admit: 2024-06-02 | Discharge: 2024-06-02 | Disposition: A | Discharge status: Home / Self Care | Source: Ambulatory Visit | Attending: Medical | Admitting: Medical

## 2024-06-02 ENCOUNTER — Ambulatory Visit (HOSPITAL_BASED_OUTPATIENT_CLINIC_OR_DEPARTMENT_OTHER)
Admission: RE | Admit: 2024-06-02 | Discharge: 2024-06-02 | Disposition: A | Discharge status: Home / Self Care | Source: Ambulatory Visit

## 2024-06-02 DIAGNOSIS — R931 Abnormal findings on diagnostic imaging of heart and coronary circulation: Secondary | ICD-10-CM | POA: Diagnosis not present

## 2024-06-02 DIAGNOSIS — R072 Precordial pain: Secondary | ICD-10-CM | POA: Diagnosis present

## 2024-06-02 DIAGNOSIS — I2510 Atherosclerotic heart disease of native coronary artery without angina pectoris: Secondary | ICD-10-CM

## 2024-06-02 MED ORDER — IOHEXOL 350 MG/ML SOLN
100.0000 mL | Freq: Once | INTRAVENOUS | Status: AC | PRN
  Administered 2024-06-02: 100 mL via INTRAVENOUS

## 2024-06-02 MED ORDER — METOPROLOL TARTRATE 5 MG/5ML IV SOLN
INTRAVENOUS | Status: AC
  Filled 2024-06-02: qty 10

## 2024-06-02 MED ORDER — DILTIAZEM HCL 25 MG/5ML IV SOLN
10.0000 mg | INTRAVENOUS | Status: DC | PRN
  Filled 2024-06-02: qty 5

## 2024-06-02 MED ORDER — METOPROLOL TARTRATE 5 MG/5ML IV SOLN
10.0000 mg | INTRAVENOUS | Status: DC | PRN
  Administered 2024-06-02: 10 mg via INTRAVENOUS
  Filled 2024-06-02 (×2): qty 10

## 2024-06-02 MED ORDER — NITROGLYCERIN 0.4 MG SL SUBL
0.8000 mg | SUBLINGUAL_TABLET | Freq: Once | SUBLINGUAL | Status: AC
  Administered 2024-06-02: 0.8 mg via SUBLINGUAL
  Filled 2024-06-02: qty 25

## 2024-06-02 NOTE — Progress Notes (Signed)
 Patient tolerated CT well. Vital signs stable encourage to drink water throughout day.Reasons explained and verbalized understanding. Ambulated steady gait.

## 2024-06-03 ENCOUNTER — Encounter: Payer: Self-pay | Admitting: *Deleted

## 2024-06-03 ENCOUNTER — Ambulatory Visit: Payer: Self-pay | Admitting: Medical

## 2024-06-10 ENCOUNTER — Other Ambulatory Visit
Admission: RE | Admit: 2024-06-10 | Discharge: 2024-06-10 | Disposition: A | Discharge status: Home / Self Care | Source: Ambulatory Visit | Attending: Medical | Admitting: Medical

## 2024-06-10 DIAGNOSIS — E78019 Familial hypercholesterolemia, unspecified: Secondary | ICD-10-CM | POA: Diagnosis present

## 2024-06-10 LAB — LIPID PANEL
Cholesterol: 160 mg/dL (ref 0–200)
HDL: 40 mg/dL — ABNORMAL LOW (ref >40)
LDL Cholesterol: 94 mg/dL (ref 0–99)
Total CHOL/HDL Ratio: 4 ratio
Triglycerides: 128 mg/dL (ref <150)
VLDL: 26 mg/dL (ref 0–40)

## 2024-06-10 MED ORDER — ASPIRIN 81 MG PO TBEC: 81.0000 mg | DELAYED_RELEASE_TABLET | Freq: Every day | ORAL | Status: AC

## 2024-06-10 NOTE — Telephone Encounter (Signed)
Pt returning call to nurse for results

## 2024-06-11 ENCOUNTER — Ambulatory Visit: Payer: Self-pay | Admitting: Medical

## 2024-06-13 ENCOUNTER — Encounter: Payer: Self-pay | Admitting: Medical

## 2024-06-13 ENCOUNTER — Ambulatory Visit: Attending: Medical | Admitting: Medical

## 2024-06-13 VITALS — BP 136/80 | HR 76 | Ht 72.0 in | Wt 182.1 lb

## 2024-06-13 DIAGNOSIS — I480 Paroxysmal atrial fibrillation: Secondary | ICD-10-CM | POA: Diagnosis present

## 2024-06-13 DIAGNOSIS — E782 Mixed hyperlipidemia: Secondary | ICD-10-CM | POA: Diagnosis present

## 2024-06-13 DIAGNOSIS — I10 Essential (primary) hypertension: Secondary | ICD-10-CM | POA: Diagnosis present

## 2024-06-13 DIAGNOSIS — E78019 Familial hypercholesterolemia, unspecified: Secondary | ICD-10-CM

## 2024-06-13 DIAGNOSIS — Z720 Tobacco use: Secondary | ICD-10-CM | POA: Diagnosis present

## 2024-06-13 DIAGNOSIS — I2510 Atherosclerotic heart disease of native coronary artery without angina pectoris: Secondary | ICD-10-CM | POA: Diagnosis present

## 2024-06-13 DIAGNOSIS — R079 Chest pain, unspecified: Secondary | ICD-10-CM | POA: Diagnosis present

## 2024-06-13 DIAGNOSIS — R0602 Shortness of breath: Secondary | ICD-10-CM | POA: Insufficient documentation

## 2024-06-13 MED ORDER — ISOSORBIDE MONONITRATE ER 30 MG PO TB24: 15.0000 mg | ORAL_TABLET | Freq: Every day | ORAL | 3 refills | Status: AC

## 2024-06-13 MED ORDER — ROSUVASTATIN CALCIUM 40 MG PO TABS: 40.0000 mg | ORAL_TABLET | Freq: Every day | ORAL | 3 refills | Status: AC

## 2024-06-13 NOTE — Progress Notes (Signed)
 Cardiology Office Note   Date:  06/13/2024  ID:  ACEN CRAUN, DOB [birth year], MRN [id number] PCP: [facility]  [facility] Providers Cardiologist:  Evalene Lunger, MD    History of Present Illness Jeremy Yoder is a 43 y.o. male with a h/o seizure disorder, tobacco use, medication noncompliance, CAD, and h/o Afib who presents for follow-up of Cardiac CTA.   The patient was diagnosed with Afib RVR 12/2017 in the setting of gastroenteritis and syncope. In the ER he did not want to undergo TEE/cardioversion.  He eventually converted on his own.  Echo showed normal LVEF. He was started on metoprolol  and Xarelto  with plan for outpatient DCCV.  When he was seen in follow-up, he was in NSR.  Patient only took 1 month of anticoagulation and then stopped.   The patient went to the ER 05/24/24 for chest pain. EKG showed Sinus tach, 103bpm.  High-sensitivity troponin negative.  Cardiology was sidelined, and outpatient cardiac CTA was ordered.  Cardiac CTA showed a coronary calcium score 150, 90th percentile, moderate stenosis in the mid LAD with high risk plaque, mild stenosis of the ostial left main and distal LAD.  CT FFR did not suggest significant stenosis.  Today, the patient reports persistent chest tightness and squeezing.  This occurs every other day.  Is not necessarily worse with exertion.  Pain lasts about 15 minutes before going away.  He took a baby ASA with improvement of pain.  He reports chronic dyspnea on exertion.  He is unsure if he has GERD. No lower leg edema. He smokes 1-1.5 ppd. He reports compliance with medications. He is wanting a cardiac cath.   Studies Reviewed EKG Interpretation Date/Time:  Friday June 13 2024 10:22:52 EDT Ventricular Rate:  76 PR Interval:  130 QRS Duration:  96 QT Interval:  370 QTC Calculation: 416 R Axis:   36  Text Interpretation: Normal sinus rhythm Normal ECG When compared with ECG of 24-May-2024 21:25, No  significant change was found Confirmed by Franchester, Bianca Vester ([id number]) on 06/13/2024 10:26:12 AM    Cardiac CTA 05/2024 IMPRESSION: 1. Coronary calcium score of 150. This was 99th percentile for age-, sex, and race-matched controls.   2. Normal coronary origin with right dominance.   3. Moderate stenosis in the mid-LAD with high-risk plaque (positive remodeling and napkin-ring sign).   4. Mild stenosis of the ostial left main and distal LAD.   RECOMMENDATIONS: CAD-RADS 3: Moderate stenosis. Consider symptom-guided anti-ischemic pharmacotherapy as well as risk factor modification per guideline directed care. Additional analysis with CT FFR will be submitted. FINDINGS: 1. LAD: CT FFR 0.90 and 0.82 for mid and distal lesions, respectively. Low likelihood of hemodynamic significance.   IMPRESSION:   1.  CT FFR analysis didn't suggest any significant stenosis.  Echo 2020 1. The left ventricle has low normal systolic function, with an ejection  fraction of 50-55%. The cavity size was normal. Left ventricular diastolic  parameters were normal. No evidence of left ventricular regional wall  motion abnormalities.   2. The aortic valve is tricuspid.   3. The aortic root and ascending aorta are normal in size and structure.       Physical Exam VS:  BP 136/80 (BP Location: Left Arm, Patient Position: Sitting, Cuff Size: Normal)   Pulse 76   Ht 6' (1.829 m)   Wt 182 lb 2 oz (82.6 kg)   SpO2 99%   BMI 24.70 kg/m  Wt Readings from Last 3 Encounters:  06/13/24 182 lb 2 oz (82.6 kg)  05/24/24 182 lb (82.6 kg)  05/03/24 189 lb (85.7 kg)    GEN: Well nourished, well developed in no acute distress NECK: No JVD; No carotid bruits CARDIAC: RRR, no murmurs, rubs, gallops RESPIRATORY:  Clear to auscultation without rales, wheezing or rhonchi  ABDOMEN: Soft, non-tender, non-distended EXTREMITIES:  No edema; No deformity   ASSESSMENT AND PLAN  CAD Chest pain Patient went to the ER  for chest pain with negative workup.  Outpatient cardiac CTA showed coronary calcium score of 150, 99th percentile for age and sex, moderate mid LAD, mild stenosis of ostial left main and distal LAD.  Overall moderate stenosis. CT FFR was negative. Patient reports persistent left sided chest pain that is not worse with exertion. Pain seems to be reproducible on palpation.  He notes chronic shortness of breath.  Patient reports his brother recently had a heart attack, and would therefore like to undergo a cardiac catheterization, so I will set him up for this.  I will update an echocardiogram.  We will continue aspirin 81 mg daily, Toprol  100 mg daily, amlodipine  10 mg daily, lisinopril  20 mg daily.  I will increase Crestor to 40 mg daily and add Imdur 15 mg daily.  We will see him back 2 weeks after heart cath..  Patient says cannot go through right wrist due to injury of artery 10 years ago. Prep left arm and groin.   HTN Blood pressure today is 136/80.  Continue amlodipine  10 mg daily, lisinopril  20 mg daily, Toprol  100 mg daily.  Add on Imdur as above.  HLD LDL 94.  Increase Crestor to 40 mg daily.  We can repeat in 2 months.  Tobacco use The patient reports smoking 1-1.5ppd. Cessation recommended.   H/o Afib A-fib diagnosed in 2019 in the setting of gastroenteritis and syncope.  Patient converted on his own and took 1 month of anticoagulation.  Since then he has not been on anticoagulation.  No further episodes since May 2019.  CHADSVASC of (HTN, CAD) at least 2. We can discuss heart monitor at follow-up.      Informed Consent   Shared Decision Making/Informed Consent The risks [stroke (1 in 1000), death (1 in 1000), kidney failure [usually temporary] (1 in 500), bleeding (1 in 200), allergic reaction [possibly serious] (1 in 200)], benefits (diagnostic support and management of coronary artery disease) and alternatives of a cardiac catheterization were discussed in detail with Jeremy Yoder and  he is willing to proceed.     Dispo: Follow-up in 3 weeks  Signed, Cheng Dec VEAR Fishman, PA-C

## 2024-06-13 NOTE — Patient Instructions (Signed)
 Medication Instructions:  Your physician recommends the following medication changes.  START TAKING: Imdur 15 mg by mouth daily  INCREASE: Rosuvastatin (Crestor) to 40 mg by mouth daily   *If you need a refill on your cardiac medications before your next appointment, please call your pharmacy*  Lab Work: Your provider would like for you to have following labs drawn today CBC, BMP.     Testing/Procedures: Your physician has requested that you have an echocardiogram. Echocardiography is a painless test that uses sound waves to create images of your heart. It provides your doctor with information about the size and shape of your heart and how well your heart's chambers and valves are working.   You may receive an ultrasound enhancing agent through an IV if needed to better visualize your heart during the echo. This procedure takes approximately one hour.  There are no restrictions for this procedure.  This will take place at [address] [facility] [address], [place] [address]  Please note: We ask at that you not bring children with you during ultrasound (echo/ vascular) testing. Due to room size and safety concerns, children are not allowed in the ultrasound rooms during exams. Our front office staff cannot provide observation of children in our lobby area while testing is being conducted. An adult accompanying a patient to their appointment will only be allowed in the ultrasound room at the discretion of the ultrasound technician under special circumstances. We apologize for any inconvenience.    [facility] A DEPT OF [facility] AT [facility] [address] [place] [address] Dept: [phone] Loc: [phone]  Jeremy Yoder  06/13/2024  You are scheduled for a Cardiac Catheterization on Friday, November 7 with Dr. Deatrice Cage.  1. Please arrive at the [facility] Vascular Center  Entrance of [facility], [address] [place], [place] [address] at 6:30 AM (This is 1 hour(s) prior to your procedure time).  Proceed to the Check-In Desk directly inside the entrance.  Procedure Parking: Use the entrance off of the [address] side of the hospital. Turn right upon entering and follow the driveway to parking that is directly in front of the Heart & Vascular Center. There is no valet parking available at this entrance, however there is an awning directly in front of the Heart & Vascular Center for drop off/ pick up for patients.  Special note: Every effort is made to have your procedure done on time. Please understand that emergencies sometimes delay scheduled procedures.  2. Diet: Nothing to eat after midnight.   3. Hydration: You need to be well hydrated before your procedure. On November 7, you may drink approved liquids (see below) until 2 hours before the procedure, with 16 oz of water as your last intake.   List of approved liquids water, clear juice, clear tea, black coffee, fruit juices, non-citric and without pulp, carbonated beverages, Gatorade, Kool -Aid, plain Jello-O and plain ice popsicles.  4. Labs: You will need to have blood drawn today (CBC, BMP)  5. Medication instructions in preparation for your procedure:   Contrast Allergy: No   On the morning of your procedure, take your Aspirin 81 mg and any morning medicines NOT listed above.  You may use sips of water.  6. Plan to go home the same day, you will only stay overnight if medically necessary. 7. Bring a current list of your medications and current insurance cards. 8. You  MUST have a responsible person to drive you home. 9. Someone MUST be with you the first 24 hours after you arrive home or your discharge will be delayed. 10. Please wear clothes that are easy to get on and off and wear slip-on shoes.  Thank you for allowing us  to care for you!   -- [facility]    Follow-Up: At [facility], you and your health needs are our priority.  As part of our continuing mission to provide you with exceptional heart care, our providers are all part of one team.  This team includes your primary Cardiologist (physician) and Advanced Practice Providers or APPs (Physician Assistants and Nurse Practitioners) who all work together to provide you with the care you need, when you need it.  Your next appointment:   3 week(s)  Provider:   Evalene Lunger, MD or Cadence Franchester, PA-C

## 2024-06-13 NOTE — H&P (View-Only) (Signed)
 Cardiology Office Note   Date:  06/13/2024  ID:  ACEN CRAUN, DOB [birth year], MRN [id number] [facility]: [facility]  [facility] Providers Cardiologist:  Evalene Lunger, MD    History of Present Illness Jeremy Yoder is a 43 y.o. male with a h/o seizure disorder, tobacco use, medication noncompliance, CAD, and h/o Afib who presents for follow-up of Cardiac CTA.   The patient was diagnosed with Afib RVR 12/2017 in the setting of gastroenteritis and syncope. In the ER he did not want to undergo TEE/cardioversion.  He eventually converted on his own.  Echo showed normal LVEF. He was started on metoprolol  and Xarelto  with plan for outpatient DCCV.  When he was seen in follow-up, he was in NSR.  Patient only took 1 month of anticoagulation and then stopped.   The patient went to the ER 05/24/24 for chest pain. EKG showed Sinus tach, 103bpm.  High-sensitivity troponin negative.  Cardiology was sidelined, and outpatient cardiac CTA was ordered.  Cardiac CTA showed a coronary calcium score 150, 90th percentile, moderate stenosis in the mid LAD with high risk plaque, mild stenosis of the ostial left main and distal LAD.  CT FFR did not suggest significant stenosis.  Today, the patient reports persistent chest tightness and squeezing.  This occurs every other day.  Is not necessarily worse with exertion.  Pain lasts about 15 minutes before going away.  He took a baby ASA with improvement of pain.  He reports chronic dyspnea on exertion.  He is unsure if he has GERD. No lower leg edema. He smokes 1-1.5 ppd. He reports compliance with medications. He is wanting a cardiac cath.   Studies Reviewed EKG Interpretation Date/Time:  Friday June 13 2024 10:22:52 EDT Ventricular Rate:  76 PR Interval:  130 QRS Duration:  96 QT Interval:  370 QTC Calculation: 416 R Axis:   36  Text Interpretation: Normal sinus rhythm Normal ECG When compared with ECG of 24-May-2024 21:25, No  significant change was found Confirmed by Franchester, Bianca Vester ([id number]) on 06/13/2024 10:26:12 AM    Cardiac CTA 05/2024 IMPRESSION: 1. Coronary calcium score of 150. This was 99th percentile for age-, sex, and race-matched controls.   2. Normal coronary origin with right dominance.   3. Moderate stenosis in the mid-LAD with high-risk plaque (positive remodeling and napkin-ring sign).   4. Mild stenosis of the ostial left main and distal LAD.   RECOMMENDATIONS: CAD-RADS 3: Moderate stenosis. Consider symptom-guided anti-ischemic pharmacotherapy as well as risk factor modification per guideline directed care. Additional analysis with CT FFR will be submitted. FINDINGS: 1. LAD: CT FFR 0.90 and 0.82 for mid and distal lesions, respectively. Low likelihood of hemodynamic significance.   IMPRESSION:   1.  CT FFR analysis didn't suggest any significant stenosis.  Echo 2020 1. The left ventricle has low normal systolic function, with an ejection  fraction of 50-55%. The cavity size was normal. Left ventricular diastolic  parameters were normal. No evidence of left ventricular regional wall  motion abnormalities.   2. The aortic valve is tricuspid.   3. The aortic root and ascending aorta are normal in size and structure.       Physical Exam VS:  BP 136/80 (BP Location: Left Arm, Patient Position: Sitting, Cuff Size: Normal)   Pulse 76   Ht 6' (1.829 m)   Wt 182 lb 2 oz (82.6 kg)   SpO2 99%   BMI 24.70 kg/m  Wt Readings from Last 3 Encounters:  06/13/24 182 lb 2 oz (82.6 kg)  05/24/24 182 lb (82.6 kg)  05/03/24 189 lb (85.7 kg)    GEN: Well nourished, well developed in no acute distress NECK: No JVD; No carotid bruits CARDIAC: RRR, no murmurs, rubs, gallops RESPIRATORY:  Clear to auscultation without rales, wheezing or rhonchi  ABDOMEN: Soft, non-tender, non-distended EXTREMITIES:  No edema; No deformity   ASSESSMENT AND PLAN  CAD Chest pain Patient went to the ER  for chest pain with negative workup.  Outpatient cardiac CTA showed coronary calcium score of 150, 99th percentile for age and sex, moderate mid LAD, mild stenosis of ostial left main and distal LAD.  Overall moderate stenosis. CT FFR was negative. Patient reports persistent left sided chest pain that is not worse with exertion. Pain seems to be reproducible on palpation.  He notes chronic shortness of breath.  Patient reports his brother recently had a heart attack, and would therefore like to undergo a cardiac catheterization, so I will set him up for this.  I will update an echocardiogram.  We will continue aspirin 81 mg daily, Toprol  100 mg daily, amlodipine  10 mg daily, lisinopril  20 mg daily.  I will increase Crestor to 40 mg daily and add Imdur 15 mg daily.  We will see him back 2 weeks after heart cath..  Patient says cannot go through right wrist due to injury of artery 10 years ago. Prep left arm and groin.   HTN Blood pressure today is 136/80.  Continue amlodipine  10 mg daily, lisinopril  20 mg daily, Toprol  100 mg daily.  Add on Imdur as above.  HLD LDL 94.  Increase Crestor to 40 mg daily.  We can repeat in 2 months.  Tobacco use The patient reports smoking 1-1.5ppd. Cessation recommended.   H/o Afib A-fib diagnosed in 2019 in the setting of gastroenteritis and syncope.  Patient converted on his own and took 1 month of anticoagulation.  Since then he has not been on anticoagulation.  No further episodes since May 2019.  CHADSVASC of (HTN, CAD) at least 2. We can discuss heart monitor at follow-up.      Informed Consent   Shared Decision Making/Informed Consent The risks [stroke (1 in 1000), death (1 in 1000), kidney failure [usually temporary] (1 in 500), bleeding (1 in 200), allergic reaction [possibly serious] (1 in 200)], benefits (diagnostic support and management of coronary artery disease) and alternatives of a cardiac catheterization were discussed in detail with Mr. Gauthreaux and  he is willing to proceed.     Dispo: Follow-up in 3 weeks  Signed, Cheng Dec VEAR Fishman, PA-C

## 2024-06-14 LAB — CBC
Hematocrit: 52.8 % — ABNORMAL HIGH (ref 37.5–51.0)
Hemoglobin: 17 g/dL (ref 13.0–17.7)
MCH: 30.7 pg (ref 26.6–33.0)
MCHC: 32.2 g/dL (ref 31.5–35.7)
MCV: 96 fL (ref 79–97)
Platelets: 217 x10E3/uL (ref 150–450)
RBC: 5.53 x10E6/uL (ref 4.14–5.80)
RDW: 12.5 % (ref 11.6–15.4)
WBC: 12.3 x10E3/uL — ABNORMAL HIGH (ref 3.4–10.8)

## 2024-06-14 LAB — BASIC METABOLIC PANEL WITH GFR
BUN/Creatinine Ratio: 12 (ref 9–20)
BUN: 12 mg/dL (ref 6–24)
CO2: 22 mmol/L (ref 20–29)
Calcium: 9.9 mg/dL (ref 8.7–10.2)
Chloride: 100 mmol/L (ref 96–106)
Creatinine, Ser: 0.99 mg/dL (ref 0.76–1.27)
Glucose: 79 mg/dL (ref 70–99)
Potassium: 4.7 mmol/L (ref 3.5–5.2)
Sodium: 141 mmol/L (ref 134–144)
eGFR: 97 mL/min/1.73 (ref >59)

## 2024-06-20 ENCOUNTER — Encounter: Payer: Self-pay | Admitting: Cardiovascular Disease

## 2024-06-20 ENCOUNTER — Other Ambulatory Visit: Payer: Self-pay

## 2024-06-20 ENCOUNTER — Ambulatory Visit
Admission: RE | Admit: 2024-06-20 | Discharge: 2024-06-20 | Disposition: A | Discharge status: Home / Self Care | Attending: Cardiovascular Disease | Admitting: Cardiovascular Disease

## 2024-06-20 ENCOUNTER — Encounter
Admission: RE | Disposition: A | Discharge status: Home / Self Care | Payer: Self-pay | Source: Home / Self Care | Attending: Cardiovascular Disease

## 2024-06-20 DIAGNOSIS — F17210 Nicotine dependence, cigarettes, uncomplicated: Secondary | ICD-10-CM | POA: Diagnosis not present

## 2024-06-20 DIAGNOSIS — R079 Chest pain, unspecified: Secondary | ICD-10-CM | POA: Diagnosis present

## 2024-06-20 DIAGNOSIS — I25118 Atherosclerotic heart disease of native coronary artery with other forms of angina pectoris: Secondary | ICD-10-CM

## 2024-06-20 DIAGNOSIS — G40909 Epilepsy, unspecified, not intractable, without status epilepticus: Secondary | ICD-10-CM | POA: Insufficient documentation

## 2024-06-20 DIAGNOSIS — E785 Hyperlipidemia, unspecified: Secondary | ICD-10-CM

## 2024-06-20 DIAGNOSIS — Z79899 Other long term (current) drug therapy: Secondary | ICD-10-CM | POA: Diagnosis not present

## 2024-06-20 DIAGNOSIS — I10 Essential (primary) hypertension: Secondary | ICD-10-CM | POA: Insufficient documentation

## 2024-06-20 DIAGNOSIS — Z7982 Long term (current) use of aspirin: Secondary | ICD-10-CM | POA: Insufficient documentation

## 2024-06-20 HISTORY — PX: CORONARY PRESSURE/FFR WITH 3D MAPPING: CATH118309

## 2024-06-20 HISTORY — PX: CORONARY STENT INTERVENTION: CATH118234

## 2024-06-20 HISTORY — PX: LEFT HEART CATH AND CORONARY ANGIOGRAPHY: CATH118249

## 2024-06-20 LAB — POCT ACTIVATED CLOTTING TIME
Activated Clotting Time: 297 s
Activated Clotting Time: 383 s

## 2024-06-20 SURGERY — LEFT HEART CATH AND CORONARY ANGIOGRAPHY
Anesthesia: Moderate Sedation

## 2024-06-20 MED ORDER — HEPARIN (PORCINE) IN NACL 1000-0.9 UT/500ML-% IV SOLN
INTRAVENOUS | Status: DC | PRN
  Administered 2024-06-20: 1000 mL

## 2024-06-20 MED ORDER — VERAPAMIL HCL 2.5 MG/ML IV SOLN
INTRAVENOUS | Status: DC | PRN
  Administered 2024-06-20: 2.5 mg via INTRA_ARTERIAL

## 2024-06-20 MED ORDER — SODIUM CHLORIDE 0.9% FLUSH: 3.0000 mL | INTRAVENOUS | Status: DC | PRN

## 2024-06-20 MED ORDER — ASPIRIN 81 MG PO CHEW
CHEWABLE_TABLET | ORAL | Status: AC
  Filled 2024-06-20: qty 1

## 2024-06-20 MED ORDER — PRASUGREL HCL 10 MG PO TABS
10.0000 mg | ORAL_TABLET | Freq: Every day | ORAL | 3 refills | Status: AC
  Filled 2024-06-20: qty 60, 60d supply, fill #0
  Filled 2024-09-12: qty 60, 60d supply, fill #1

## 2024-06-20 MED ORDER — ONDANSETRON HCL 4 MG/2ML IJ SOLN: 4.0000 mg | Freq: Four times a day (QID) | INTRAMUSCULAR | Status: DC | PRN

## 2024-06-20 MED ORDER — SODIUM CHLORIDE 0.9 % IV SOLN: 250.0000 mL | INTRAVENOUS | Status: DC | PRN

## 2024-06-20 MED ORDER — IOHEXOL 300 MG/ML  SOLN
INTRAMUSCULAR | Status: DC | PRN
  Administered 2024-06-20: 150 mL

## 2024-06-20 MED ORDER — FREE WATER
500.0000 mL | Freq: Once | Status: AC
  Administered 2024-06-20: 500 mL via ORAL

## 2024-06-20 MED ORDER — LIDOCAINE HCL 1 % IJ SOLN
INTRAMUSCULAR | Status: AC
  Filled 2024-06-20: qty 20

## 2024-06-20 MED ORDER — LIDOCAINE HCL (PF) 1 % IJ SOLN
INTRAMUSCULAR | Status: DC | PRN
  Administered 2024-06-20: 2 mL

## 2024-06-20 MED ORDER — PRASUGREL HCL 10 MG PO TABS
ORAL_TABLET | ORAL | Status: DC | PRN
  Administered 2024-06-20: 60 mg via ORAL

## 2024-06-20 MED ORDER — HEPARIN SODIUM (PORCINE) 1000 UNIT/ML IJ SOLN
INTRAMUSCULAR | Status: AC
  Filled 2024-06-20: qty 10

## 2024-06-20 MED ORDER — SODIUM CHLORIDE 0.9% FLUSH: 3.0000 mL | Freq: Two times a day (BID) | INTRAVENOUS | Status: DC

## 2024-06-20 MED ORDER — FENTANYL CITRATE (PF) 100 MCG/2ML IJ SOLN
INTRAMUSCULAR | Status: DC | PRN
  Administered 2024-06-20: 25 ug via INTRAVENOUS
  Administered 2024-06-20: 50 ug via INTRAVENOUS

## 2024-06-20 MED ORDER — ACETAMINOPHEN 325 MG PO TABS: 650.0000 mg | ORAL_TABLET | ORAL | Status: DC | PRN

## 2024-06-20 MED ORDER — MIDAZOLAM HCL (PF) 2 MG/2ML IJ SOLN
INTRAMUSCULAR | Status: DC | PRN
  Administered 2024-06-20: 1 mg via INTRAVENOUS

## 2024-06-20 MED ORDER — PRASUGREL HCL 10 MG PO TABS
ORAL_TABLET | ORAL | Status: AC
  Filled 2024-06-20: qty 6

## 2024-06-20 MED ORDER — FREE WATER: 500.0000 mL | Freq: Once | Status: DC

## 2024-06-20 MED ORDER — ASPIRIN 81 MG PO CHEW: 81.0000 mg | CHEWABLE_TABLET | ORAL | Status: DC

## 2024-06-20 MED ORDER — HEPARIN SODIUM (PORCINE) 1000 UNIT/ML IJ SOLN
INTRAMUSCULAR | Status: DC | PRN
  Administered 2024-06-20 (×2): 4000 [IU] via INTRAVENOUS

## 2024-06-20 MED ORDER — HEPARIN (PORCINE) IN NACL 1000-0.9 UT/500ML-% IV SOLN
INTRAVENOUS | Status: AC
  Filled 2024-06-20: qty 1000

## 2024-06-20 MED ORDER — FENTANYL CITRATE (PF) 100 MCG/2ML IJ SOLN
INTRAMUSCULAR | Status: AC
  Filled 2024-06-20: qty 2

## 2024-06-20 MED ORDER — NITROGLYCERIN 1 MG/10 ML FOR IR/CATH LAB
INTRA_ARTERIAL | Status: DC | PRN
Start: 2024-06-20 — End: 2024-06-20
  Administered 2024-06-20: 200 ug via INTRACORONARY

## 2024-06-20 MED ORDER — MIDAZOLAM HCL 2 MG/2ML IJ SOLN
INTRAMUSCULAR | Status: AC
Start: 2024-06-20 — End: 2024-06-20
  Filled 2024-06-20: qty 2

## 2024-06-20 MED ORDER — VERAPAMIL HCL 2.5 MG/ML IV SOLN
INTRAVENOUS | Status: AC
  Filled 2024-06-20: qty 2

## 2024-06-20 SURGICAL SUPPLY — 18 items
BALLOON ~~LOC~~ TREK NEO RX 3.25X8 (BALLOONS) IMPLANT
CATH 5F 110X4 TIG (CATHETERS) IMPLANT
CATH DRAGONFLY OPSTAR (CATHETERS) IMPLANT
CATH LAUNCHER 6FR EBU 4 (CATHETERS) IMPLANT
DEVICE RAD TR BAND REGULAR (VASCULAR PRODUCTS) IMPLANT
DRAPE BRACHIAL (DRAPES) IMPLANT
GLIDESHEATH SLEND SS 6F .021 (SHEATH) IMPLANT
GUIDEWIRE INQWIRE 1.5J.035X260 (WIRE) IMPLANT
GUIDEWIRE PRESSURE X 175 (WIRE) IMPLANT
KIT ENCORE 26 ADVANTAGE (KITS) IMPLANT
KIT ESSENTIALS PG (KITS) IMPLANT
KIT SYRINGE INJ CVI SPIKEX1 (MISCELLANEOUS) IMPLANT
PACK CARDIAC CATH (CUSTOM PROCEDURE TRAY) ×2 IMPLANT
SET ATX-X65L (MISCELLANEOUS) IMPLANT
STATION PROTECTION PRESSURIZED (MISCELLANEOUS) IMPLANT
STENT ONYX FRONTIER 2.75X15 (Permanent Stent) IMPLANT
STENT ONYX FRONTIER 3.5X15 (Permanent Stent) IMPLANT
TUBING CIL FLEX 10 FLL-RA (TUBING) IMPLANT

## 2024-06-20 NOTE — Discharge Instructions (Signed)
 Radial Site Care Refer to this sheet in the next few weeks. These instructions provide you with information about caring for yourself after your procedure. Your health care provider may also give you more specific instructions. Your treatment has been planned according to current medical practices, but problems sometimes occur. Call your health care provider if you have any problems or questions after your procedure. What can I expect after the procedure? After your procedure, it is typical to have the following: Bruising at the radial site that usually fades within 1-2 weeks. Blood collecting in the tissue (hematoma) that may be painful to the touch. It should usually decrease in size and tenderness within 1-2 weeks.  Follow these instructions at home: Take medicines only as directed by your health care provider. If you are on a medication called Metformin please do not take for 48 hours after your procedure. Over the next 48hrs please increase your fluid intake of water and non caffeine beverages to flush the contrast dye out of your system.  You may shower 24 hours after the procedure  Leave your bandage on and gently wash the site with plain soap and water. Pat the area dry with a clean towel. Do not rub the site, because this may cause bleeding.  Remove your dressing 48hrs after your procedure and leave open to air.  Do not submerge your site in water for 7 days. This includes swimming and washing dishes.  Check your insertion site every day for redness, swelling, or drainage. Do not apply powder or lotion to the site. Do not flex or bend the affected arm for 24 hours or as directed by your health care provider. Do not push or pull heavy objects with the affected arm for 24 hours or as directed by your health care provider. Do not lift over 10 lb (4.5 kg) for 5 days after your procedure or as directed by your health care provider. Ask your health care provider when it is okay to: Return to  work or school. Resume usual physical activities or sports. Resume sexual activity. Do not drive home if you are discharged the same day as the procedure. Have someone else drive you. You may drive 48 hours after the procedure Do not operate machinery or power tools for 24 hours after the procedure. If your procedure was done as an outpatient procedure, which means that you went home the same day as your procedure, a responsible adult should be with you for the first 24 hours after you arrive home. Keep all follow-up visits as directed by your health care provider. This is important. Contact a health care provider if: You have a fever. You have chills. You have increased bleeding from the radial site. Hold pressure on the site. Get help right away if: You have unusual pain at the radial site. You have redness, warmth, or swelling at the radial site. You have drainage (other than a small amount of blood on the dressing) from the radial site. The radial site is bleeding, and the bleeding does not stop after 15 minutes of holding steady pressure on the site. Your arm or hand becomes pale, cool, tingly, or numb. This information is not intended to replace advice given to you by your health care provider. Make sure you discuss any questions you have with your health care provider. Document Released: 09/02/2010 Document Revised: 01/06/2016 Document Reviewed: 02/16/2014 Elsevier Interactive Patient Education  2018 [employer].

## 2024-06-20 NOTE — Interval H&P Note (Signed)
 History and Physical Interval Note:  06/20/2024 7:55 AM  Jeremy Yoder  has presented today for surgery, with the diagnosis of L Cath   Chest pain.  The various methods of treatment have been discussed with the patient and family. After consideration of risks, benefits and other options for treatment, the patient has consented to  Procedure(s): LEFT HEART CATH AND CORONARY ANGIOGRAPHY (Left) as a surgical intervention.  The patient's history has been reviewed, patient examined, no change in status, stable for surgery.  I have reviewed the patient's chart and labs.  Questions were answered to the patient's satisfaction.     Jennilyn Esteve

## 2024-06-20 NOTE — Discharge Summary (Addendum)
 Discharge Summary for Same Day PCI   Patient ID: Jeremy Yoder MRN: [id number]; DOB: Dec 02, 1980  Admit date: 06/20/2024 Discharge date: 06/20/2024  Primary Care Provider: [facility]  Primary Cardiologist: Evalene Lunger, MD  Primary Electrophysiologist:  None   Discharge Diagnoses    Principal Problem:   Coronary artery disease of native artery of native heart with stable angina pectoris Active Problems:   Essential hypertension   Hyperlipidemia  Diagnostic Studies/Procedures    Cardiac Catheterization 06/20/2024:  Unofficial report:    Cardiac catheterization done via the left radial artery showed 70% stenosis in the mid LAD and proximal LAD.  This was significant by flow reserve evaluation with an IFR of 0.87.  Thus, OCT guided PCI and 2 nonoverlapped drug-eluting stent placement was performed to the LAD with no complications. Given that he might not tolerate aspirin, I elected to place him on prasugrel to be used for at least 6 months.       _____________   History of Present Illness     Jeremy Yoder is a 43 y.o. male with with a h/o seizure disorder, tobacco use, medication noncompliance, CAD and h/o Afib.   The patient went to the ER 05/24/2024 for chest pain.  EKG showed sinus tach with a heart rate of 103 bpm.  High-sensitivity troponin was negative.  Outpatient cardiac CTA was recommended.  This showed coronary calcium score of 150, 90th percentile, moderate stenosis in the mid LAD and high risk plaque, mild stenosis of the ostial left main and distal LAD.  CT FFR did not show significant stenosis.  The patient was seen back 06/13/2024 reporting persistent chest tightness and squeezing.  Cardiac catheterization was arranged for further evaluation.  Hospital Course     The patient underwent cardiac cath as noted above that showed 70% stenosis in the mid LAD and proximal LAD.  This was significant by flow reserve evaluation with an IFR of 0.87.   Thus OCT guided PCI and 2 nonoverlapping drug-eluting stent placement was performed to the LAD with no complications.  Patient may not tolerate aspirin, and was started on prasugrel to be used for at least 6 months.There were no observed complications post cath. Radial left cath site was re-evaluated prior to discharge and found to be stable without any complications. Instructions/precautions regarding cath site care were given prior to discharge.  Jeremy Yoder was seen by Dr. Darron and determined stable for discharge home. Follow up with our office has been arranged. Medications are listed below. Pertinent changes include addition of Prasugrel.   _____________  Cath/PCI Registry Performance & Quality Measures: Aspirin prescribed? - Yes ADP Receptor Inhibitor (Plavix/Clopidogrel, Brilinta/Ticagrelor or Effient/Prasugrel) prescribed (includes medically managed patients)? - Yes High Intensity Statin (Lipitor 40-80mg  or Crestor 20-40mg ) prescribed? - Yes For EF <40%, was ACEI/ARB prescribed? - Not Applicable (EF >/= 40%) For EF <40%, Aldosterone Antagonist (Spironolactone or Eplerenone) prescribed? - Not Applicable (EF >/= 40%) Cardiac Rehab Phase II ordered (Included Medically managed Patients)? - Yes  _____________   Discharge Vitals Blood pressure (!) 118/91, pulse 78, temperature 97.6 F (36.4 C), temperature source Temporal, resp. rate 16, height 6' (1.829 m), weight 83.1 kg, SpO2 95%.  Filed Weights   06/20/24 0705  Weight: 83.1 kg    Last Labs & Radiologic Studies    CBC No results for input(s): WBC, NEUTROABS, HGB, HCT, MCV, PLT in the last 72 hours. Basic Metabolic Panel No results for input(s): NA, K, CL, CO2, GLUCOSE,  BUN, CREATININE, CALCIUM, MG, PHOS in the last 72 hours. Liver Function Tests No results for input(s): AST, ALT, ALKPHOS, BILITOT, PROT, ALBUMIN in the last 72 hours. No results for input(s): LIPASE, AMYLASE in  the last 72 hours. High Sensitivity Troponin:   Recent Labs  Lab 05/24/24 2130 05/25/24 0031 05/25/24 0341  TROPONINIHS 4 4 4     BNP Invalid input(s): POCBNP D-Dimer No results for input(s): DDIMER in the last 72 hours. Hemoglobin A1C No results for input(s): HGBA1C in the last 72 hours. Fasting Lipid Panel No results for input(s): CHOL, HDL, LDLCALC, TRIG, CHOLHDL, LDLDIRECT in the last 72 hours. Thyroid  Function Tests No results for input(s): TSH, T4TOTAL, T3FREE, THYROIDAB in the last 72 hours.  Invalid input(s): FREET3 _____________  CT CORONARY MORPH W/CTA COR W/SCORE W/CA W/CM &/OR WO/CM Addendum Date: 06/06/2024 ADDENDUM REPORT: 06/06/2024 22:04 EXAM: OVER-READ INTERPRETATION  CT CHEST The following report is an over-read performed by radiologist Dr. Andrea Gasman of [facility] on 06/06/2024. This over-read does not include interpretation of cardiac or coronary anatomy or pathology. The coronary CTA interpretation by the cardiologist is attached. COMPARISON:  Chest CTA 05/25/2024 FINDINGS: Vascular: No aortic atherosclerosis. The included aorta is normal in caliber. Mediastinum/nodes: No adenopathy or mass.  Minimal hiatal hernia. Lungs: No focal airspace disease. No pulmonary nodule. No pleural fluid. Bronchial thickening. Upper abdomen: No acute or unexpected findings. Musculoskeletal: There are no acute or suspicious osseous abnormalities. IMPRESSION: 1. No acute extracardiac findings. 2. Bronchial thickening. 3. Minimal hiatal hernia. Electronically Signed   By: Andrea Gasman M.D.   On: 06/06/2024 22:04   Result Date: 06/06/2024 CLINICAL DATA:  Chest pain EXAM: Cardiac/Coronary  CTA TECHNIQUE: The patient was scanned on a Siemens Somatom scanner. : A prospective scan was triggered in the ascending thoracic aorta. Axial non-contrast 3 mm slices were carried out through the heart. The data set was analyzed on a dedicated work station  and scored using the Agatson method. Gantry rotation speed was 66 msecs and collimation was .6 mm. 100mg  of oral metoprolol , 10mg  of IV metoprolol  and 0.8 mg of sl NTG was given. The 3D data set was reconstructed in 5% intervals of the 35-75 % of the R-R cycle. Diastolic phases were analyzed on a dedicated work station using MPR, MIP, and VRT modes. The patient received 100 cc of contrast. FINDINGS: Image quality: Excellent. Noise artifact is: Limited. Other artifacts: None significant. Coronary Arteries:  Normal coronary origin.  Right dominance. Left main: The left main is a large caliber vessel with a normal take off from the left coronary cusp that bifurcates to form a left anterior descending artery and a left circumflex artery. There is mild stenosis (25-49%) in the ostial to mid-segment due to mixed plaque with positive remodeling. Left anterior descending artery: There is moderate stenosis (50-69%) of the mid segment due to mixed plaque with positive remodeling and napkin-ring sign. There is mild stenosis (25-49%) in the distal-segment due to calcified plaque. The LAD gives off 3 diagonal branches. There is minimal stenosis (<25%) in the proximal segment of D2 due to calcified plaque. Left circumflex artery: The LCX is non-dominant. There is minimal stenosis (<25%) in the ostium due to calcified plaque. There is mild stenosis (25-49%) in the mid-segment due to mixed plaque. The LCX gives off 1 patent obtuse marginal branch. Right coronary artery: The RCA is dominant with normal take off from the right coronary cusp. There is minimal stenosis (<25%) in the mid-segment due to mixed  plaque. The RCA terminates as a PDA and right posterolateral branch. There is minimal stenosis (<25%) in the PLB due to non-calcified plaque. Right Atrium: Right atrial size is within normal limits. Right Ventricle: The right ventricular cavity is within normal limits. Left Atrium: Left atrial size is normal in size with no left  atrial appendage filling defect. Left Ventricle: The ventricular cavity size is within normal limits. Pulmonary arteries: Normal in size. Pulmonary veins: Normal pulmonary venous drainage. Pericardium: Normal thickness without significant effusion or calcium present. Cardiac valves: The aortic valve is trileaflet without significant calcification. The mitral valve is normal without significant calcification. Aorta: Normal caliber without significant disease. Extra-cardiac findings: See attached radiology report for non-cardiac structures. IMPRESSION: 1. Coronary calcium score of 150. This was 99th percentile for age-, sex, and race-matched controls. 2. Normal coronary origin with right dominance. 3. Moderate stenosis in the mid-LAD with high-risk plaque (positive remodeling and napkin-ring sign). 4. Mild stenosis of the ostial left main and distal LAD. RECOMMENDATIONS: CAD-RADS 3: Moderate stenosis. Consider symptom-guided anti-ischemic pharmacotherapy as well as risk factor modification per guideline directed care. Additional analysis with CT FFR will be submitted. Caron Poser, MD Electronically Signed: By: Caron Poser On: 06/02/2024 14:05   CT CORONARY FRACTIONAL FLOW RESERVE FLUID ANALYSIS Result Date: 06/02/2024 EXAM: CT FFR analysis was performed on the original cardiac CTA dataset. Diagrammatic representation of the CT FFR analysis is provided in a separate PDF document in PACS. This dictation was created using the PDF document and an interactive 3D model of the results. The 3D model is not available in the EMR/PACS. INTERPRETATION: CT FFR provides simultaneous calculation of pressure and flow across the entire coronary tree. For clinical decision making, CT FFR values should be obtained 1-2 cm distal to the lower border of each stenosis measured. Coronary CTA-related artifacts may impair the diagnostic accuracy of the original cardiac CTA and FFR CT results. *Due to the fact that CT FFR represents  a mathematically-derived analysis, it is recommended that the results be interpreted as follows: 1. CT FFR >0.80: Low likelihood of hemodynamic significance. 2. CT FFR 0.76-0.80: Borderline likelihood of hemodynamic significance. 3. CT FFR =< 0.75: High likelihood of hemodynamic significance. *Coronary CT Angiography-derived Fractional Flow Reserve Testing in Patients with Stable Coronary Artery Disease: Recommendations on Interpretation and Reporting. Radiology: Cardiothoracic Imaging. 2019;1(5):[id number] FINDINGS: 1. LAD: CT FFR 0.90 and 0.82 for mid and distal lesions, respectively. Low likelihood of hemodynamic significance. IMPRESSION: 1.  CT FFR analysis didn't suggest any significant stenosis. Caron Poser, MD Electronically Signed   By: Caron Poser   On: 06/02/2024 14:05   CT Angio Chest PE W and/or Wo Contrast Result Date: 05/25/2024 CLINICAL DATA:  Pulmonary embolism (PE) suspected, high prob. Chest pain. EXAM: CT ANGIOGRAPHY CHEST WITH CONTRAST TECHNIQUE: Multidetector CT imaging of the chest was performed using the standard protocol during bolus administration of intravenous contrast. Multiplanar CT image reconstructions and MIPs were obtained to evaluate the vascular anatomy. RADIATION DOSE REDUCTION: This exam was performed according to the departmental dose-optimization program which includes automated exposure control, adjustment of the mA and/or kV according to patient size and/or use of iterative reconstruction technique. CONTRAST:  75mL OMNIPAQUE  IOHEXOL  350 MG/ML SOLN COMPARISON:  03/01/2019 FINDINGS: Cardiovascular: Heart is normal size. Aorta is normal caliber. Scattered coronary artery and aortic calcifications. No filling defects in the pulmonary arteries to suggest pulmonary emboli. Mediastinum/Nodes: No mediastinal, hilar, or axillary adenopathy. Trachea and esophagus are unremarkable. Thyroid  unremarkable. Lungs/Pleura: No confluent airspace opacities  or effusions. Scarring  lingula. Upper Abdomen: No acute findings Musculoskeletal: Chest wall soft tissues are unremarkable. No acute bony abnormality. Review of the MIP images confirms the above findings. IMPRESSION: No evidence of pulmonary embolus. No acute cardiopulmonary disease. Coronary artery disease. Aortic Atherosclerosis (ICD10-I70.0). Electronically Signed   By: Franky Crease M.D.   On: 05/25/2024 00:07   DG Chest 2 View Result Date: 05/24/2024 CLINICAL DATA:  Chest pain EXAM: CHEST - 2 VIEW COMPARISON:  05/03/2024 FINDINGS: The heart size and mediastinal contours are within normal limits. Both lungs are clear. The visualized skeletal structures are unremarkable. IMPRESSION: No active cardiopulmonary disease. Electronically Signed   By: Franky Crease M.D.   On: 05/24/2024 21:54    Disposition   Pt is being discharged home today in good condition.  Follow-up Plans & Appointments     Discharge Instructions     AMB Referral to Cardiac Rehabilitation - Phase II   Complete by: As directed    Diagnosis: Coronary Stents   After initial evaluation and assessments completed: Virtual Based Care may be provided alone or in conjunction with Phase 2 Cardiac Rehab based on patient barriers.: Yes   Intensive Cardiac Rehabilitation (ICR) [facility] location only OR Traditional Cardiac Rehabilitation (TCR) *If criteria for ICR are not met will enroll in TCR [facility] only): Yes   Diet - low sodium heart healthy   Complete by: As directed    Discharge instructions   Complete by: As directed    No driving for 1 week. No lifting over 10 lbs for 2 weeks. No sexual activity for 2 weeks. You may return to work once cleared by your cardiologist. Keep procedure site clean & dry. If you notice increased pain, swelling, bleeding or pus, call/return!  You may shower, but no soaking baths/hot tubs/pools for 1 week.   Increase activity slowly   Complete by: As directed         Discharge Medications   Allergies as of 06/20/2024        Reactions   Atorvastatin Calcium Other (See Comments)   Tiotropium Bromide    Other Reaction(s): mouth blisters   Aspirin Itching   Ibuprofen Itching        Medication List     STOP taking these medications    metoprolol  tartrate 100 MG tablet Commonly known as: LOPRESSOR        TAKE these medications    albuterol  108 (90 Base) MCG/ACT inhaler Commonly known as: VENTOLIN  HFA Inhale 2 puffs into the lungs every 6 (six) hours as needed for wheezing or shortness of breath.   amLODipine  10 MG tablet Commonly known as: NORVASC  Take 10 mg by mouth daily.   aspirin EC 81 MG tablet Take 1 tablet (81 mg total) by mouth daily. Swallow whole.   fenofibrate 145 MG tablet Commonly known as: TRICOR Take 145 mg by mouth daily.   isosorbide mononitrate 30 MG 24 hr tablet Commonly known as: IMDUR Take 0.5 tablets (15 mg total) by mouth daily.   lamoTRIgine  100 MG 24 hour tablet Commonly known as: LAMICTAL  XR Take 100 mg by mouth daily.   lisinopril  20 MG tablet Commonly known as: ZESTRIL  Take 20 mg by mouth daily.   metoprolol  succinate 100 MG 24 hr tablet Commonly known as: TOPROL -XL Take 100 mg by mouth daily.   montelukast 10 MG tablet Commonly known as: SINGULAIR Take 10 mg by mouth daily.   prasugrel 10 MG Tabs tablet Commonly known as: Effient Take 1 tablet (  10 mg total) by mouth daily.   rosuvastatin 40 MG tablet Commonly known as: CRESTOR Take 1 tablet (40 mg total) by mouth at bedtime.   valACYclovir 500 MG tablet Commonly known as: VALTREX Take 500 mg by mouth daily.           Allergies Allergies  Allergen Reactions   Atorvastatin Calcium Other (See Comments)   Tiotropium Bromide     Other Reaction(s): mouth blisters   Aspirin Itching   Ibuprofen Itching    Outstanding Labs/Studies   N/A  Duration of Discharge Encounter   Greater than 30 minutes including physician time.  Signed, Ladawna Walgren VEAR Fishman, PA-C 06/20/2024, 2:49 PM

## 2024-06-20 NOTE — Brief Op Note (Signed)
 Cardiac catheterization done via the left radial artery showed 70% stenosis in the mid LAD and proximal LAD.  This was significant by flow reserve evaluation with an IFR of 0.87.  Thus, OCT guided PCI and 2 nonoverlapped drug-eluting stent placement was performed to the LAD with no complications. Given that he might not tolerate aspirin, I elected to place him on prasugrel to be used for at least 6 months. Home today in 6 hours if no issues. Full report to follow.

## 2024-06-22 ENCOUNTER — Emergency Department
Admission: EM | Admit: 2024-06-22 | Discharge: 2024-06-22 | Discharge status: Against Medical Advice | Attending: Emergency Medicine | Admitting: Emergency Medicine

## 2024-06-22 ENCOUNTER — Other Ambulatory Visit: Payer: Self-pay

## 2024-06-22 ENCOUNTER — Encounter: Payer: Self-pay | Admitting: Emergency Medicine

## 2024-06-22 ENCOUNTER — Emergency Department

## 2024-06-22 ENCOUNTER — Ambulatory Visit

## 2024-06-22 DIAGNOSIS — I25118 Atherosclerotic heart disease of native coronary artery with other forms of angina pectoris: Secondary | ICD-10-CM | POA: Diagnosis not present

## 2024-06-22 DIAGNOSIS — R0789 Other chest pain: Secondary | ICD-10-CM | POA: Diagnosis present

## 2024-06-22 DIAGNOSIS — R7989 Other specified abnormal findings of blood chemistry: Secondary | ICD-10-CM | POA: Insufficient documentation

## 2024-06-22 DIAGNOSIS — Z5329 Procedure and treatment not carried out because of patient's decision for other reasons: Secondary | ICD-10-CM | POA: Diagnosis not present

## 2024-06-22 DIAGNOSIS — R079 Chest pain, unspecified: Secondary | ICD-10-CM

## 2024-06-22 DIAGNOSIS — F17210 Nicotine dependence, cigarettes, uncomplicated: Secondary | ICD-10-CM | POA: Insufficient documentation

## 2024-06-22 DIAGNOSIS — I480 Paroxysmal atrial fibrillation: Secondary | ICD-10-CM | POA: Diagnosis not present

## 2024-06-22 DIAGNOSIS — I10 Essential (primary) hypertension: Secondary | ICD-10-CM | POA: Insufficient documentation

## 2024-06-22 DIAGNOSIS — I2510 Atherosclerotic heart disease of native coronary artery without angina pectoris: Secondary | ICD-10-CM | POA: Insufficient documentation

## 2024-06-22 DIAGNOSIS — I479 Paroxysmal tachycardia, unspecified: Secondary | ICD-10-CM

## 2024-06-22 LAB — BASIC METABOLIC PANEL WITH GFR
Anion gap: 12 (ref 5–15)
BUN: 13 mg/dL (ref 6–20)
CO2: 23 mmol/L (ref 22–32)
Calcium: 8.9 mg/dL (ref 8.9–10.3)
Chloride: 105 mmol/L (ref 98–111)
Creatinine, Ser: 0.96 mg/dL (ref 0.61–1.24)
GFR, Estimated: >60 mL/min (ref >60)
Glucose, Bld: 101 mg/dL — ABNORMAL HIGH (ref 70–99)
Potassium: 3.6 mmol/L (ref 3.5–5.1)
Sodium: 140 mmol/L (ref 135–145)

## 2024-06-22 LAB — CBC
HCT: 49.9 % (ref 39.0–52.0)
Hemoglobin: 16.9 g/dL (ref 13.0–17.0)
MCH: 30.9 pg (ref 26.0–34.0)
MCHC: 33.9 g/dL (ref 30.0–36.0)
MCV: 91.2 fL (ref 80.0–100.0)
Platelets: 207 K/uL (ref 150–400)
RBC: 5.47 MIL/uL (ref 4.22–5.81)
RDW: 13.2 % (ref 11.5–15.5)
WBC: 9.7 K/uL (ref 4.0–10.5)
nRBC: 0 % (ref 0.0–0.2)

## 2024-06-22 LAB — RESP PANEL BY RT-PCR (RSV, FLU A&B, COVID)  RVPGX2
Influenza A by PCR: NEGATIVE
Influenza B by PCR: NEGATIVE
Resp Syncytial Virus by PCR: NEGATIVE
SARS Coronavirus 2 by RT PCR: NEGATIVE

## 2024-06-22 LAB — TROPONIN I (HIGH SENSITIVITY)
Troponin I (High Sensitivity): 48 ng/L — ABNORMAL HIGH (ref <18)
Troponin I (High Sensitivity): 51 ng/L — ABNORMAL HIGH (ref <18)

## 2024-06-22 LAB — C-REACTIVE PROTEIN: CRP: 0.6 mg/dL (ref <1.0)

## 2024-06-22 LAB — SEDIMENTATION RATE: Sed Rate: 2 mm/h (ref 0–15)

## 2024-06-22 NOTE — ED Notes (Signed)
 Unable to get IV on patient. Message another RN to take a look to get an IV. Awaiting response.

## 2024-06-22 NOTE — ED Notes (Signed)
 Pt called out stating he wanted something to drink. Explained to patient that I needed to ask the provider. Per the patient the doctor said he could have something to drink but not anything to eat. Pt given lemon-lime soda with ice.

## 2024-06-22 NOTE — ED Triage Notes (Signed)
 Patient c/o mid chest pain and feeling sick onset of this morning. Patient reports had stents placed Friday and had minor pain after but worsened today. C/o emesis and fever today.

## 2024-06-22 NOTE — ED Notes (Signed)
 CCMD called and verified patient on cardiac telemetry

## 2024-06-22 NOTE — ED Notes (Addendum)
 Pt refusing IV team to place an IV. Per Dr. Dicky we can just draw blood. Phlebotomy called to come draw blood.

## 2024-06-22 NOTE — Discharge Instructions (Signed)
 You are leaving the hospital against the advice of both myself and your cardiologist.  I feel that you are putting yourself at unnecessary and high risk of death or major complication or disability by leaving today, and not allowing us  to care for you further.  We strongly encourage you to return to the emergency room right away if you change your mind and are willing to be admitted or have further evaluation.  Please continue your current prescription medications.  Return the emergency room right away if you pass out, have worsening symptoms or pain difficulty breathing, faint, or any other new concern or symptom arises.

## 2024-06-22 NOTE — ED Triage Notes (Signed)
 First nurse note: Pt to ED via [facility] from home. Pt reports CP and arm blueness. Pt reports had 2 stents put in Friday. Pain 6/10 and worse with inspiration. EMS reports pt refused IV access. 324mg  ASA taken PTA.

## 2024-06-22 NOTE — ED Provider Notes (Signed)
 [facility] Provider Note    Event Date/Time   First MD Initiated Contact with Patient 06/22/24 1500     (approximate)   History   Chest Pain   HPI  Jeremy Yoder is a 43 y.o. male  seizure disorder, tobacco use, medication noncompliance, CAD and h/o Afib , recent cardiac stent,      Prior to arrival had to call 9 1 after he developed heavy chest pressure started feeling sweaty and felt faint.  His brother convinced him and called 911.  Symptom has largely gone away now just a very light sense of chest discomfort.  Had a cardiac stent procedure done Friday.  He had slight discomfort similar since the procedure but it became suddenly worse earlier.  He has been taking all his prescribed medication  Physical Exam   Triage Vital Signs: ED Triage Vitals [06/22/24 1239]  Encounter Vitals Group     BP (!) 145/93     Girls Systolic BP Percentile      Girls Diastolic BP Percentile      Boys Systolic BP Percentile      Boys Diastolic BP Percentile      Pulse Rate (!) 124     Resp 18     Temp 98.5 F (36.9 C)     Temp Source Oral     SpO2 100 %     Weight 183 lb (83 kg)     Height 6' (1.829 m)     Head Circumference      Peak Flow      Pain Score 6     Pain Loc      Pain Education      Exclude from Growth Chart     Most recent vital signs: Vitals:   06/22/24 1700 06/22/24 1730  BP: 120/86 (!) 142/99  Pulse: 63 75  Resp: 17 13  Temp:    SpO2: 100% 100%     General: Awake, no distress.  CV:  Good peripheral perfusion.  Normal tones and rate Resp:  Normal effort.  Clear bilateral Abd:  No distention.  Soft nontender Other:  Not diaphoretic not in any distress awake alert oriented pleasant family at bedside   ED Results / Procedures / Treatments   Labs (all labs ordered are listed, but only abnormal results are displayed) Labs Reviewed  BASIC METABOLIC PANEL WITH GFR - Abnormal; Notable for the following components:      Result  Value   Glucose, Bld 101 (*)    All other components within normal limits  TROPONIN I (HIGH SENSITIVITY) - Abnormal; Notable for the following components:   Troponin I (High Sensitivity) 48 (*)    All other components within normal limits  TROPONIN I (HIGH SENSITIVITY) - Abnormal; Notable for the following components:   Troponin I (High Sensitivity) 51 (*)    All other components within normal limits  RESP PANEL BY RT-PCR (RSV, FLU A&B, COVID)  [id number]  CBC  SEDIMENTATION RATE  C-REACTIVE PROTEIN     EKG  And inter by me at 1520 heart rate 80 QRS 90 QTc 430 Normal sinus rhythm very slight ST segment abnormality noted in inferior leads.  Discussed with cardiologist who also saw and evaluated the patient.  Does not appear obviously consistent with STEMI but concerning for ACS nonetheless   RADIOLOGY  DG Chest 2 View Result Date: 06/22/2024 EXAM: 2 VIEW(S) XRAY OF THE CHEST 06/22/2024 01:01:00 PM COMPARISON: Chest radiograph 05/24/2022. CLINICAL  HISTORY: chest pain FINDINGS: LUNGS AND PLEURA: No focal pulmonary opacity. No pulmonary edema. No pleural effusion. No pneumothorax. HEART AND MEDIASTINUM: No acute abnormality of the cardiac and mediastinal silhouettes. BONES AND SOFT TISSUES: No acute osseous abnormality. IMPRESSION: 1. No acute cardiopulmonary findings. Electronically signed by: Harrietta Sherry MD 06/22/2024 01:33 PM EST RP Workstation: [id number]     Chest x-ray is inter by me is negative for acute   PROCEDURES:  Critical Care performed: Yes, see critical care procedure note(s)  Procedures   MEDICATIONS ORDERED IN ED: Medications - No data to display   IMPRESSION / MDM / ASSESSMENT AND PLAN / ED COURSE  I reviewed the triage vital signs and the nursing notes.                              Differential diagnosis includes, but is not limited to, ACS, aortic dissection, pulmonary embolism, cardiac tamponade, pneumothorax, pneumonia, pericarditis, myocarditis,  GI-related causes including esophagitis/gastritis, and musculoskeletal chest wall pain.     In-stent rethrombosis, ACS, recurrent ACS MI unstable angina, stent malposition, coronary dissection, post stent pain, pericarditis etc. all considered.  Patient's presentation is most consistent with acute presentation with potential threat to life or bodily function.   The patient is on the cardiac monitor to evaluate for evidence of arrhythmia and/or significant heart rate changes.   Clinical Course as of 06/22/24 1906  [place] Jun 22, 2024  1517 Consulted with cardiology Dr. Argentina, he reports he is well aware of the patient knows him well.  At this juncture reviewed his ECG clinical history and symptoms recommends serial troponin monitoring, he will provide cardiology consult for the patient shortly, and recommends further admission to the hospitalist service with serial troponins and also to check inflammatory markers as we consider the cause of his chest pain in the setting of recent cardiac stenting. [MQ]  1517 Discussed with patient reports very mild chest pain at this time.  He is previously taking aspirin.  He declines use of nitrates as it causes headache.  He has taken his Effient and Imdur today and reports the pain is very minimal if any compared to the concerns he had earlier leading to 911 call.  Trend troponins, cardiology to see shortly. [MQ]  1610 Dr. Argentina, has seen and evaluated the patient.  He recommended the patient for admission.  He reports the patient told him that he would declined to be admitted unless concern on a repeat blood test.  Cardiology advising they strongly encouraged the patient to stay for admission. [MQ]  1657 Troponin I (High Sensitivity)(!): 51 Trop minima, but present, uptrending. Discussed with Dr. Argentina.  [MQ]    Clinical Course User Index [MQ] Dicky Anes, MD   Initial troponin abnormal second troponin also abnormal but only with very minimal delta  change but still uptrending clearly in need of admission.  Pain and symptoms seem to have fully abated  As of 6:30 PM discussed with patient, he is adamant that he will not stay for admission.  He reports desire to continue on all his prescribed medications which he has, and he will not stay in the hospital at this time.  He does report he is going to continue to smoke counseled on that he reports he is going be a lifelong smoker nonetheless understands risk of that  He has been seen by her cardiologist who told her to be admitted he has been seen by  me who told him to be admitted but he declines admission.  The patient is clear has normal capacity, and is very much understanding able to repeat back the risks of heart attack, death, cardiac arrest, severe worsening symptoms and clearly understands the strong recommendation to stay in the hospital to rule out a heart attack or other life-threatening cause of his chest pain.  I also on the board diagram doubt what his stent looks like tried to describe physiology concerns for clotting, in-stent thrombosis, coronary dissection etc. in plain language and with diagrams that he understands very clearly but declines to stay.  His father also at the bedside  Return precautions and treatment recommendations and follow-up discussed with the patient who is leaving AGAINST MEDICAL ADVICE   FINAL CLINICAL IMPRESSION(S) / ED DIAGNOSES   Final diagnoses:  Chest pain with high risk of acute coronary syndrome     Rx / DC Orders   ED Discharge Orders          Ordered    Ambulatory referral to Cardiology        06/22/24 1852             Note:  This document was prepared using Dragon voice recognition software and may include unintentional dictation errors.   Dicky Anes, MD 06/22/24 [phone]

## 2024-06-22 NOTE — Consult Note (Addendum)
 Cardiology Consultation:  Patient ID: Jeremy Yoder MRN: [id number]; DOB: 1980/08/21  Admit date: 06/22/2024 Date of Consult: 06/22/2024  Primary Care Provider: [facility] Primary Cardiologist: Evalene Lunger, MD  Primary Electrophysiologist:  None   Patient Profile:  Jeremy Yoder is a 43 y.o. male with a hx of reported paroxysmal atrial fibrillation, premature CAD with recent PCI to LAD 06/20/2024, ongoing tobacco use, HLD, seizure disorder who is being seen today for the evaluation of chest discomfort.  History of Present Illness:  Jeremy Yoder presents with 2 days of chest discomfort.  He initially states that the chest discomfort started right after his PCI last Friday.  On further interview, he says that it started early on Saturday. He says the pain is dull and does not feel similar to the angina he was having before his catheterization.  He also complains of palpitations which occurred yesterday.  Denies any syncope.  He is pain-free at the time of interview.  On arrival to the ED, he is afebrile with initial heart rate of 124.  BP 145/93.  100% on room air.  CBC is normal.  BMP is normal.  Initial troponin of 48.  Index ECG shows sinus tachycardia with nonspecific T wave normalities.  No ST elevations.  Past Medical History: Past Medical History:  Diagnosis Date   A-fib (HCC)    CAD (coronary artery disease)    Hypertension    a. Noncompliant w/ meds.   Seizures (HCC)    a. Dx in teens. Noncompliant w/ meds.   Tobacco abuse     Past Surgical History: Past Surgical History:  Procedure Laterality Date   arm     CARDIAC SURGERY     EYE SURGERY     FOREARM SURGERY     KNEE SURGERY       Allergies:    Allergies  Allergen Reactions   Atorvastatin Calcium Other (See Comments)   Tiotropium Bromide     Other Reaction(s): mouth blisters   Aspirin Itching   Ibuprofen Itching    Social History:   Social History   Socioeconomic History   Marital  status: Married    Spouse name: Not on file   Number of children: Not on file   Years of education: Not on file   Highest education level: Not on file  Occupational History   Not on file  Tobacco Use   Smoking status: Every Day    Current packs/day: 0.50    Average packs/day: 0.5 packs/day for 20.0 years (10.0 ttl pk-yrs)    Types: Cigarettes   Smokeless tobacco: Never   Tobacco comments:    currently smoking 0.5 - 1 ppd.  Vaping Use   Vaping status: Never Used  Substance and Sexual Activity   Alcohol use: No   Drug use: No   Sexual activity: Not on file  Other Topics Concern   Not on file  Social History Narrative   Lives in [place] with his wife and four children (ages 63, 7, 8, & 10).  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   Social Drivers of Corporate Investment Banker Strain: Low Risk  (03/04/2024)   Received from [facility]   Overall Financial Resource Strain (CARDIA)    Difficulty of Paying Living Expenses: Not hard at all  Food Insecurity: No Food Insecurity (03/04/2024)   Received from [facility]   Hunger Vital  Sign    Within the past 12 months, you worried that your food would run out before you got the money to buy more.: Never true    Within the past 12 months, the food you bought just didn't last and you didn't have money to get more.: Never true  Transportation Needs: No Transportation Needs (03/04/2024)   Received from [facility] - Transportation    In the past 12 months, has lack of transportation kept you from medical appointments or from getting medications?: No    Lack of Transportation (Non-Medical): No  Physical Activity: Not on file  Stress: Not on file  Social Connections: Not on file  Intimate Partner Violence: Not on file     Family History:    Family History  Problem Relation Age of Onset   Hypertension Mother    Diabetes Mother     Atrial fibrillation Mother    Heart disease Father        Pt doesn't know much about father's history but says he has either a PPM or ICD.   Heart attack Brother      ROS:  All other ROS reviewed and negative. Pertinent positives noted in the HPI.     Physical Exam/Data:   Vitals:   06/22/24 1239 06/22/24 1433  BP: (!) 145/93 (!) 126/91  Pulse: (!) 124 82  Resp: 18 13  Temp: 98.5 F (36.9 C)   TempSrc: Oral   SpO2: 100% 98%  Weight: 83 kg   Height: 6' (1.829 m)    No intake or output data in the 24 hours ending 06/22/24 1549     06/22/2024   12:39 PM 06/20/2024    7:05 AM 06/13/2024   10:24 AM  Last 3 Weights  Weight (lbs) 183 lb 183 lb 4.8 oz 182 lb 2 oz  Weight (kg) 83.008 kg 83.144 kg 82.611 kg    Body mass index is 24.82 kg/m.  General: no acute distress  Neck: no JVD Cardiac: Normal S1, S2; RRR; no murmurs, rubs, or gallops Lungs: Clear to auscultation bilaterally, no wheezing, rhonchi or rales  Ext: No edema, pulses 2+ Skin: Warm and dry Neuro: Alert and oriented to person, place, time, and situation Psych: Normal mood and affect   EKG:  The EKG was personally reviewed and demonstrates: Sinus rhythm with nonspecific ST-T changes Telemetry:  Telemetry was personally reviewed and demonstrates: Sinus rhythm  Relevant CV Studies: -Cath 06/20/24 70% mid LAD lesion and 70% proximal to mid LAD lesion treated with 2 DES.  No notable disease in the LCx or RCA. - TTE 02/2019 LVEF 50 to 55%, no significant valve disease - TTE 12/2017 normal biventricular function without significant valve disease - History of paroxysmal atrial fibrillation dating back to 12/2017  Assessment and Plan:  Chest discomfort Elevated troponin CAD with recent PCI LAD 06/20/2024 Paroxysmal atrial fibrillation Patient presents with undifferentiated chest pain.  He notes that the pain started at least 24 hours ago (somewhat unclear on interview).  He is pain-free at the time of discussion.  He also  reports palpitations.  He does note that the pain feels different than this symptom that brought him to PCI.  No syncope.  He says that he has not missed any doses of his DAPT.  ECG is at baseline, no signs of acute stent thrombosis.  Somewhat unclear etiology at this time.  His initial troponin is 48 which could be a holdover from his recent catheterization.  If he was having significant ischemia with symptoms beginning over 24 hours ago, would expect the troponin to be higher than this.  Alternatively this also could represent post PCI pericarditis.  This seems less likely since his pain has now resolved.  Finally, he reports palpitations which occurred yesterday.  Paroxysms of atrial fibrillation are also in the differential.  Of note, he is adamant that he will not be admitted to the hospital.  I strongly recommended that he stay for observation and serial troponin/risk stratification.  Plan: - Would trend troponin as this will give us  a better idea of his trajectory.  If the troponin is flat, this is possibly a holdover from his catheterization on Friday especially given that his pain is resolved - If the troponin is increasing at a exponential rate, then would treat him as an ACS with heparin infusion and we can plan for catheterization tomorrow if he would be willing to stay - Post PCI pericarditis seems less likely given spontaneous resolution of pain - He has an outpatient echo pending.  If he is willing to be admitted, then we can obtain this as an inpatient - I will order him a monitor given palpitations and history of recent revascularization as well as atrial fibrillation since he said that he does not desire admission for further monitoring - Continue ASA 81 mg daily, prasugrel 10 mg daily, Crestor 40 mg daily - Continue Imdur 30 mg daily - Not currently on [facility] for atrial fibrillation; CHADS2VASC of 1.  - Further plans pending results of workup and willingness to stay for  observation/admission    For questions or updates, please contact [facility] Please consult [email or web address] for contact info under    Signed, Caron Poser, MD  [facility]  [facility]  06/22/2024 3:49 PM

## 2024-06-23 ENCOUNTER — Encounter: Payer: Self-pay | Admitting: Cardiovascular Disease

## 2024-07-08 ENCOUNTER — Other Ambulatory Visit: Payer: Self-pay

## 2024-07-08 ENCOUNTER — Emergency Department

## 2024-07-08 ENCOUNTER — Emergency Department: Admission: EM | Admit: 2024-07-08 | Discharge: 2024-07-08 | Disposition: A | Discharge status: Home / Self Care

## 2024-07-08 DIAGNOSIS — I10 Essential (primary) hypertension: Secondary | ICD-10-CM | POA: Diagnosis not present

## 2024-07-08 DIAGNOSIS — R1010 Upper abdominal pain, unspecified: Secondary | ICD-10-CM

## 2024-07-08 DIAGNOSIS — I2510 Atherosclerotic heart disease of native coronary artery without angina pectoris: Secondary | ICD-10-CM | POA: Insufficient documentation

## 2024-07-08 DIAGNOSIS — R1013 Epigastric pain: Secondary | ICD-10-CM | POA: Insufficient documentation

## 2024-07-08 LAB — COMPREHENSIVE METABOLIC PANEL WITH GFR
ALT: 24 U/L (ref 0–44)
AST: 30 U/L (ref 15–41)
Albumin: 4.5 g/dL (ref 3.5–5.0)
Alkaline Phosphatase: 75 U/L (ref 38–126)
Anion gap: 11 (ref 5–15)
BUN: 14 mg/dL (ref 6–20)
CO2: 27 mmol/L (ref 22–32)
Calcium: 9.1 mg/dL (ref 8.9–10.3)
Chloride: 104 mmol/L (ref 98–111)
Creatinine, Ser: 0.89 mg/dL (ref 0.61–1.24)
GFR, Estimated: >60 mL/min (ref >60)
Glucose, Bld: 91 mg/dL (ref 70–99)
Potassium: 3.8 mmol/L (ref 3.5–5.1)
Sodium: 142 mmol/L (ref 135–145)
Total Bilirubin: 0.3 mg/dL (ref 0.0–1.2)
Total Protein: 7.5 g/dL (ref 6.5–8.1)

## 2024-07-08 LAB — URINALYSIS, ROUTINE W REFLEX MICROSCOPIC
Bilirubin Urine: NEGATIVE
Glucose, UA: NEGATIVE mg/dL
Hgb urine dipstick: NEGATIVE
Ketones, ur: NEGATIVE mg/dL
Leukocytes,Ua: NEGATIVE
Nitrite: NEGATIVE
Protein, ur: NEGATIVE mg/dL
Specific Gravity, Urine: 1.028 (ref 1.005–1.030)
pH: 5 (ref 5.0–8.0)

## 2024-07-08 LAB — LIPASE, BLOOD: Lipase: 41 U/L (ref 11–51)

## 2024-07-08 LAB — CBC
HCT: 49.7 % (ref 39.0–52.0)
Hemoglobin: 16.8 g/dL (ref 13.0–17.0)
MCH: 31.1 pg (ref 26.0–34.0)
MCHC: 33.8 g/dL (ref 30.0–36.0)
MCV: 91.9 fL (ref 80.0–100.0)
Platelets: 242 K/uL (ref 150–400)
RBC: 5.41 MIL/uL (ref 4.22–5.81)
RDW: 12.9 % (ref 11.5–15.5)
WBC: 10 K/uL (ref 4.0–10.5)
nRBC: 0 % (ref 0.0–0.2)

## 2024-07-08 LAB — TROPONIN T, HIGH SENSITIVITY: Troponin T High Sensitivity: <15 ng/L (ref 0–19)

## 2024-07-08 MED ORDER — ONDANSETRON 4 MG PO TBDP
4.0000 mg | ORAL_TABLET | Freq: Once | ORAL | Status: DC
Start: 2024-07-08 — End: 2024-07-09

## 2024-07-08 MED ORDER — FAMOTIDINE 20 MG PO TABS
20.0000 mg | ORAL_TABLET | Freq: Once | ORAL | Status: AC
  Administered 2024-07-08: 20 mg via ORAL
  Filled 2024-07-08: qty 1

## 2024-07-08 MED ORDER — ACETAMINOPHEN 500 MG PO TABS
1000.0000 mg | ORAL_TABLET | Freq: Once | ORAL | Status: AC
  Administered 2024-07-08: 1000 mg via ORAL
  Filled 2024-07-08: qty 2

## 2024-07-08 MED ORDER — ALUM & MAG HYDROXIDE-SIMETH 200-200-20 MG/5ML PO SUSP
30.0000 mL | Freq: Once | ORAL | Status: AC
  Administered 2024-07-08: 30 mL via ORAL
  Filled 2024-07-08: qty 30

## 2024-07-08 MED ORDER — LIDOCAINE VISCOUS HCL 2 % MT SOLN
15.0000 mL | Freq: Once | OROMUCOSAL | Status: AC
  Administered 2024-07-08: 15 mL via ORAL
  Filled 2024-07-08: qty 15

## 2024-07-08 MED ORDER — OMEPRAZOLE MAGNESIUM 20 MG PO TBEC: 20.0000 mg | DELAYED_RELEASE_TABLET | Freq: Every day | ORAL | 1 refills | Status: DC

## 2024-07-08 MED ORDER — ALUMINUM-MAGNESIUM-SIMETHICONE 200-200-20 MG/5ML PO SUSP
30.0000 mL | Freq: Three times a day (TID) | ORAL | 0 refills | Status: AC
Start: 2024-07-08 — End: ?

## 2024-07-08 NOTE — Discharge Instructions (Addendum)
 Your evaluation in the emergency department was not fully completed but you did request discharge home.  It is possible you may be having irritation of the lining of your stomach, and I have prescribed you antacid medications to help with this.  Your heart testing is pending at this time.  Please do follow-up with your primary care provider and cardiologist for reevaluation, and return to the emergency department with any new or worsening symptoms or if you decide you would like further workup.

## 2024-07-08 NOTE — ED Notes (Signed)
 Spoke to Gage earlier and was told the Troponin would be processed.  Provider stated that Trop was still showing to be collected.  Called and spoke to Guinda. She said she was processing that now.

## 2024-07-08 NOTE — ED Triage Notes (Signed)
 Patient states upper abdominal pain that started a few days ago, denies N/V/D and urinary symptoms.

## 2024-07-08 NOTE — ED Provider Notes (Signed)
 [facility] Provider Note    Event Date/Time   First MD Initiated Contact with Patient 07/08/24 2029     (approximate)   History   Abdominal Pain  Patient states upper abdominal pain that started a few days ago, denies N/V/D and urinary symptoms.    HPI Jeremy Yoder is a 43 y.o. male PMH CAD, hyperlipidemia, bipolar disorder, paroxysmal A-fib not on anticoagulation, seizure disorder, hypertension presents for evaluation of abdominal pain - Patient states he has been having epigastric pain for about the past 3 weeks.  Lower abdominal pain.  No black or bloody stools.  Some nausea but no vomiting.  No fevers. - No chest pain.  No shortness of breath. - He is taking his prasugrel  as prescribed. - chest pain is constant, somewhat worse today than prior so decided to come to the emergency department for eval  Per chart review, patient underwent cardiac catheterization on 06/20/2024.  70% stenosis to mid LAD and proximal LAD, 2 DES placed.  Discharged on prasugrel .  Subsequently seen in our emergency department on 06/22/2024 complaining of chest pressure.  Workup unremarkable, consulted with cardiology, ultimately recommended admission though patient decided to leave hospital AGAINST MEDICAL ADVICE.  Per chart review, patient did have a CT PE study last month with no underlying aortic aneurysm.     Physical Exam   Triage Vital Signs: ED Triage Vitals [07/08/24 1849]  Encounter Vitals Group     BP (!) 141/98     Girls Systolic BP Percentile      Girls Diastolic BP Percentile      Boys Systolic BP Percentile      Boys Diastolic BP Percentile      Pulse Rate 94     Resp 18     Temp 98.1 F (36.7 C)     Temp Source Oral     SpO2 98 %     Weight      Height 6' (1.829 m)     Head Circumference      Peak Flow      Pain Score 7     Pain Loc      Pain Education      Exclude from Growth Chart     Most recent vital signs: Vitals:   07/08/24 1849   BP: (!) 141/98  Pulse: 94  Resp: 18  Temp: 98.1 F (36.7 C)  SpO2: 98%     General: Awake, no distress.  CV:  Good peripheral perfusion. RRR, RP 2+ Resp:  Normal effort. CTAB Abd:  No distention.  Moderate tenderness to palpation in epigastrium only.  No tenderness at all throughout lower abdomen.  No pulsatile masses.    ED Results / Procedures / Treatments   Labs (all labs ordered are listed, but only abnormal results are displayed) Labs Reviewed  URINALYSIS, ROUTINE W REFLEX MICROSCOPIC - Abnormal; Notable for the following components:      Result Value   Color, Urine YELLOW (*)    APPearance CLEAR (*)    All other components within normal limits  LIPASE, BLOOD  COMPREHENSIVE METABOLIC PANEL WITH GFR  CBC  TROPONIN T, HIGH SENSITIVITY  TROPONIN T, HIGH SENSITIVITY     EKG  Not completed   RADIOLOGY Not completed    PROCEDURES:  Critical Care performed: No  Procedures   MEDICATIONS ORDERED IN ED: Medications  ondansetron  (ZOFRAN -ODT) disintegrating tablet 4 mg (4 mg Oral Patient Refused/Not Given 07/08/24 2114)  alum & mag  hydroxide-simeth (MAALOX/MYLANTA) 200-200-20 MG/5ML suspension 30 mL (30 mLs Oral Given 07/08/24 2110)    And  lidocaine  (XYLOCAINE ) 2 % viscous mouth solution 15 mL (15 mLs Oral Given 07/08/24 2110)  famotidine  (PEPCID ) tablet 20 mg (20 mg Oral Given 07/08/24 2108)  acetaminophen  (TYLENOL ) tablet 1,000 mg (1,000 mg Oral Given 07/08/24 2108)     IMPRESSION / MDM / ASSESSMENT AND PLAN / ED COURSE  I reviewed the triage vital signs and the nursing notes.                              DDX/MDM/AP: Differential diagnosis includes, but is not limited to, dyspepsia/gastritis, consider cholecystitis, pancreatitis, do not clinically suspect perforated ulcer, recent imaging with no underlying aneurysm--do not suspect interval development of aortic dissection.  Consider anginal equivalent/stent thrombosis though patient does have notable  point tenderness in epigastrium.   Plan: - Labs --CBC, CMP, lipase reassuring from triage, adding troponin - EKG - GI cocktail, Zofran , Tylenol  - Right upper quadrant ultrasound to evaluate for possible underlying biliary stone/colic - Reassess  Patient's presentation is most consistent with acute presentation with potential threat to life or bodily function.  The patient is on the cardiac monitor to evaluate for evidence of arrhythmia and/or significant heart rate changes.  ED course below.  Patient ultimately left emergency department AGAINST MEDICAL ADVICE, declined further workup, did not want to wait for results of troponin or to have EKG performed.  Troponin subsequently fortunately came back normal, less likely cardiac etiology at this time.  Patient did feel much better after GI cocktail, suspect dyspepsia contributing to presentation, did provide prescription for omeprazole  and Maalox.  Clinical Course as of 07/08/24 2331  Tue Jul 08, 2024  2057 CBC, CMP, lipase reviewed, unremarkable  Urinalysis unremarkable [MM]  2212 Patient becoming increasingly agitated requesting discharge home.  Discussed that his heart testing is not yet back and I cannot confirm that he is not having a heart issue at this time.  Tells me all of his symptoms have completely resolved with the medications we gave him here and he is not amenable to waiting for testing.  Understands risks of leaving early including undiagnosed medical problems that could lead to clinical worsening including death.  Will proceed with discharge home AGAINST MEDICAL ADVICE per patient request. [MM]    Clinical Course User Index [MM] Clarine Ozell LABOR, MD     FINAL CLINICAL IMPRESSION(S) / ED DIAGNOSES   Final diagnoses:  Upper abdominal pain     Rx / DC Orders   ED Discharge Orders          Ordered    omeprazole  (PRILOSEC  OTC) 20 MG tablet  Daily        07/08/24 2211    aluminum -magnesium  hydroxide-simethicone   (MAALOX) 200-200-20 MG/5ML SUSP  3 times daily before meals & bedtime        07/08/24 2211             Note:  This document was prepared using Dragon voice recognition software and may include unintentional dictation errors.   Clarine Ozell LABOR, MD 07/08/24 [phone]

## 2024-07-15 ENCOUNTER — Ambulatory Visit: Attending: Medical | Admitting: Medical

## 2024-07-15 ENCOUNTER — Encounter: Payer: Self-pay | Admitting: Medical

## 2024-07-15 VITALS — BP 128/84 | HR 104 | Ht 72.0 in | Wt 187.6 lb

## 2024-07-15 DIAGNOSIS — Z720 Tobacco use: Secondary | ICD-10-CM | POA: Diagnosis present

## 2024-07-15 DIAGNOSIS — I480 Paroxysmal atrial fibrillation: Secondary | ICD-10-CM | POA: Diagnosis present

## 2024-07-15 DIAGNOSIS — I2510 Atherosclerotic heart disease of native coronary artery without angina pectoris: Secondary | ICD-10-CM | POA: Insufficient documentation

## 2024-07-15 DIAGNOSIS — R079 Chest pain, unspecified: Secondary | ICD-10-CM | POA: Diagnosis not present

## 2024-07-15 DIAGNOSIS — E782 Mixed hyperlipidemia: Secondary | ICD-10-CM | POA: Diagnosis not present

## 2024-07-15 DIAGNOSIS — I10 Essential (primary) hypertension: Secondary | ICD-10-CM | POA: Diagnosis present

## 2024-07-15 MED ORDER — EZETIMIBE 10 MG PO TABS: 10.0000 mg | ORAL_TABLET | Freq: Every day | ORAL | 3 refills | Status: AC

## 2024-07-15 MED ORDER — OMEPRAZOLE MAGNESIUM 20 MG PO TBEC: 40.0000 mg | DELAYED_RELEASE_TABLET | Freq: Every day | ORAL | 3 refills | Status: DC

## 2024-07-15 NOTE — Progress Notes (Signed)
 Cardiology Office Note   Date:  07/15/2024  ID:  Jeremy Yoder, DOB Dec 20, 1980, MRN [id number] PCP: [facility]  [facility] Providers Cardiologist:  Evalene Lunger, MD   History of Present Illness Jeremy Yoder is a 43 y.o. male  with a h/o seizure disorder, tobacco use, medication noncompliance, CAD, HTN, HLD, and h/o Afib who presents for follow-up of Cardiac catheterization.    The patient was diagnosed with Afib RVR 12/2017 in the setting of gastroenteritis and syncope. In the ER he did not want to undergo TEE/cardioversion.  He eventually converted on his own.  Echo showed normal LVEF. He was started on metoprolol  and Xarelto  with plan for outpatient DCCV.  When he was seen in follow-up, he was in NSR.  Patient only took 1 month of anticoagulation and then stopped.    The patient went to the ER 05/24/24 for chest pain. EKG showed Sinus tach, 103bpm.  High-sensitivity troponin negative.  Cardiology was sidelined, and outpatient cardiac CTA was ordered.  Cardiac CTA showed a coronary calcium  score 150, 90th percentile, moderate stenosis in the mid LAD with high risk plaque, mild stenosis of the ostial left main and distal LAD.  CT FFR did not suggest significant stenosis.  Patient was ultimately set up for cardiac catheterization.  This showed 70% stenosis in the mid LAD and proximal LAD.  This was significant by flow reserve evaluation with an IFR of 0.87.  Thus, OCT guided PCI and 2 nonoverlapping drug-eluting stent placement was performed to the LAD with no complications.  Since he did not tolerate aspirin , he was placed on prasugrel  for at least 6 months.  Patient was discharged home in good condition.  He came back to the ER 06/22/2024 with chest pain and palpitations.  High-sensitivity troponin was 48> 51.  EKG showed normal sinus rhythm with no ischemic changes.  Patient reported that pain was different from prior to PCI.  He denied missing doses of his DAPT.  He  was seen by Dr. Argentina. Observation was recommended, but patient did not want to stay in the hospital so he was discharged home. Low suspicion of pericarditis sine he had sudden resolution of pain. He was given a heart monitor to wear to assess for Afib.   He presented back to the ER 11/25 with abdominal pain. Troponin was negative at that point. He was given acid reflux medication and sent home.   Today, the patient reports he is feeling better. He was able to go for a long walk and had no chest pain or SOB. Cath site, left side, is stable. He reports he still is having abdominal/epigastric pain, suspected acid reflux. He has apt with PCP. He is wearing the heart monitor, and echo apt is later this month.   Studies Reviewed EKG Interpretation Date/Time:  Tuesday July 15 2024 10:42:12 EST Ventricular Rate:  104 PR Interval:  120 QRS Duration:  84 QT Interval:  326 QTC Calculation: 428 R Axis:   43  Text Interpretation: Sinus tachycardia When compared with ECG of 22-Jun-2024 15:13, PREVIOUS ECG IS PRESENT Confirmed by Franchester, Bennett Vanscyoc ([id number]) on 07/15/2024 10:46:05 AM    LHC 06/20/24   Mid LAD-1 lesion is 70% stenosed.   2nd Diag lesion is 30% stenosed.   Prox LAD to Mid LAD lesion is 70% stenosed.   Mid LAD-2 lesion is 30% stenosed.   A drug-eluting stent was successfully placed using a STENT ONYX FRONTIER 2.75X15.   A drug-eluting stent  was successfully placed using a STENT ONYX FRONTIER 3.5X15.   Post intervention, there is a 0% residual stenosis.   Post intervention, there is a 0% residual stenosis.   Recommend uninterrupted dual antiplatelet therapy with Aspirin  81mg  daily and Prasugrel  10mg  daily for a minimum of 6 months (stable ischemic heart disease-Class I recommendation).   1.  Significant one-vessel coronary artery disease involving the LAD.  iFR was 0.87.  Both mid and proximal LAD appeared significant by OCT with MLA of less than 4 mm.  No other obstructive disease. 2.   Left ventricular angiography was not performed LVEDP was mildly elevated. 3.  Successful OCT guided PCI and drug-eluting stent placement to the mid and proximal LAD.   Recommendations: Aggressive treatment of risk factors. If the patient cannot tolerate aspirin , he can be treated with prasugrel  monotherapy.  Cardiac CTA 05/2024 IMPRESSION: 1. Coronary calcium  score of 150. This was 99th percentile for age-, sex, and race-matched controls.   2. Normal coronary origin with right dominance.   3. Moderate stenosis in the mid-LAD with high-risk plaque (positive remodeling and napkin-ring sign).   4. Mild stenosis of the ostial left main and distal LAD.    FINDINGS: 1. LAD: CT FFR 0.90 and 0.82 for mid and distal lesions, respectively. Low likelihood of hemodynamic significance.   IMPRESSION:   1.  CT FFR analysis didn't suggest any significant stenosis.        Physical Exam VS:  BP 128/84   Pulse (!) 104   Ht 6' (1.829 m)   Wt 187 lb 9.6 oz (85.1 kg)   SpO2 98%   BMI 25.44 kg/m        Wt Readings from Last 3 Encounters:  07/15/24 187 lb 9.6 oz (85.1 kg)  06/22/24 183 lb (83 kg)  06/20/24 183 lb 4.8 oz (83.1 kg)    GEN: Well nourished, well developed in no acute distress NECK: No JVD; No carotid bruits CARDIAC: RRR, no murmurs, rubs, gallops RESPIRATORY:  Clear to auscultation without rales, wheezing or rhonchi  ABDOMEN: Soft, non-tender, non-distended EXTREMITIES:  No edema; No deformity   ASSESSMENT AND PLAN  Chest pain CAD Recent LHC for abnormal cardiac CTA showed significant 1V CAD involving the LAD, iFR 0.87, both mid and proximal LAD appeared significant by OCT, no other obstructive CAD, LVEDP was mildly elevated. Patient was treated with successful OCT guided PCI and DES placement to the mid and pLAD. He was started on DAPT with ASA and Prasugrel  for at least 6 months. The patient went to the ER twice since then, first for chest pain and second abdominal pain.  Chest pain ER work-up showed mildly elevated troponin. Admission was recommended for observation, but the patient did not want to be admitted. He was seen by Dr. Argentina who recommended a heart monitor to assess for Afib. Inflammatory markers were negative, low suspicion of pericarditis. The second ER visit for abdominal pain was felt to be from GERD, and he was started on Omeprazole . He reports no chest pain, but is still having the epigastric pain. It is dull and constant. I will increase omeprazole , and he has an upcoming appointment with PCP . Cath site is stable. Follow-up H&H normal. I will refer the patient to cardiac rehab. He has echo scheduled for later this month. Continue DAPT with ASA and Prasugrel .   HTN BP is normal today. Continue amlodipine  5mg  daily, Imdur  15mg  daily, Toprol  100mg  daily, lisinopril  20mg  daily.   HLD LDL 94. Continue  Crestor  40mg  daily and fenofibrate. I will add on Zetia. Can repeat at follow-up.   Tobacco use Cessation recommended  H/o Afib Afib diagnosed in the setting of gastroenteritis and syncope. Patient converted on his own and took 1 month of anticoagulation. Since then, he has not been on anticoagulation. CHADSVASC of 2. He is wearing a heart monitor to assess for Afib as above.     Cardiac Rehabilitation Eligibility Assessment         Dispo: Follow-up in 3 months  Signed, Jahlil Ziller VEAR Fishman, PA-C

## 2024-07-15 NOTE — Patient Instructions (Signed)
 Medication Instructions:  Your physician recommends the following medication changes.  START TAKING: Zetia  10 mg by mouth daily  INCREASE: Omeprazole  to 40 mg by mouth daily   *If you need a refill on your cardiac medications before your next appointment, please call your pharmacy*  Lab Work: No labs ordered today    Testing/Procedures: No test ordered today   Follow-Up: At [facility], you and your health needs are our priority.  As part of our continuing mission to provide you with exceptional heart care, our providers are all part of one team.  This team includes your primary Cardiologist (physician) and Advanced Practice Providers or APPs (Physician Assistants and Nurse Practitioners) who all work together to provide you with the care you need, when you need it.  Your next appointment:   3 month(s)  Provider:   Timothy Gollan, MD or Cadence Franchester, PA-C

## 2024-07-17 ENCOUNTER — Other Ambulatory Visit: Payer: Self-pay

## 2024-07-17 ENCOUNTER — Telehealth: Payer: Self-pay | Admitting: Pharmacy Technician

## 2024-07-17 ENCOUNTER — Other Ambulatory Visit (HOSPITAL_COMMUNITY): Payer: Self-pay

## 2024-07-17 MED ORDER — OMEPRAZOLE 40 MG PO CPDR: 40.0000 mg | DELAYED_RELEASE_CAPSULE | Freq: Every day | ORAL | 3 refills | Status: AC

## 2024-07-17 NOTE — Telephone Encounter (Signed)
 Hi, insurance is not wanting to pay for the 20mg  at 2 tablets a day but they would pay for 40mg  capsules at 1 a day and copay would be 4.00. can the prescription please be changed to omeprazole  40mg  capsules 1 capsule a day? Thank you

## 2024-07-22 ENCOUNTER — Other Ambulatory Visit: Payer: Self-pay

## 2024-07-22 ENCOUNTER — Encounter: Payer: Self-pay | Admitting: *Deleted

## 2024-07-22 ENCOUNTER — Emergency Department

## 2024-07-22 ENCOUNTER — Emergency Department: Admission: EM | Admit: 2024-07-22 | Discharge: 2024-07-22 | Disposition: A | Discharge status: Home / Self Care

## 2024-07-22 DIAGNOSIS — W19XXXA Unspecified fall, initial encounter: Secondary | ICD-10-CM

## 2024-07-22 DIAGNOSIS — S3011XA Contusion of abdominal wall, initial encounter: Secondary | ICD-10-CM

## 2024-07-22 LAB — CBC
HCT: 49 % (ref 39.0–52.0)
Hemoglobin: 16.5 g/dL (ref 13.0–17.0)
MCH: 31 pg (ref 26.0–34.0)
MCHC: 33.7 g/dL (ref 30.0–36.0)
MCV: 91.9 fL (ref 80.0–100.0)
Platelets: 227 K/uL (ref 150–400)
RBC: 5.33 MIL/uL (ref 4.22–5.81)
RDW: 13.2 % (ref 11.5–15.5)
WBC: 7.6 K/uL (ref 4.0–10.5)
nRBC: 0 % (ref 0.0–0.2)

## 2024-07-22 LAB — HEPATIC FUNCTION PANEL
ALT: 22 U/L (ref 0–44)
AST: 27 U/L (ref 15–41)
Albumin: 4.5 g/dL (ref 3.5–5.0)
Alkaline Phosphatase: 77 U/L (ref 38–126)
Bilirubin, Direct: 0.1 mg/dL (ref 0.0–0.2)
Indirect Bilirubin: 0.2 mg/dL — ABNORMAL LOW (ref 0.3–0.9)
Total Bilirubin: 0.4 mg/dL (ref 0.0–1.2)
Total Protein: 7.7 g/dL (ref 6.5–8.1)

## 2024-07-22 LAB — URINALYSIS, ROUTINE W REFLEX MICROSCOPIC
Bilirubin Urine: NEGATIVE
Glucose, UA: NEGATIVE mg/dL
Hgb urine dipstick: NEGATIVE
Ketones, ur: NEGATIVE mg/dL
Leukocytes,Ua: NEGATIVE
Nitrite: NEGATIVE
Protein, ur: NEGATIVE mg/dL
Specific Gravity, Urine: 1.013 (ref 1.005–1.030)
pH: 5 (ref 5.0–8.0)

## 2024-07-22 LAB — BASIC METABOLIC PANEL WITH GFR
Anion gap: 12 (ref 5–15)
BUN: 12 mg/dL (ref 6–20)
CO2: 28 mmol/L (ref 22–32)
Calcium: 9.8 mg/dL (ref 8.9–10.3)
Chloride: 101 mmol/L (ref 98–111)
Creatinine, Ser: 0.8 mg/dL (ref 0.61–1.24)
GFR, Estimated: >60 mL/min (ref >60)
Glucose, Bld: 82 mg/dL (ref 70–99)
Potassium: 4.1 mmol/L (ref 3.5–5.1)
Sodium: 141 mmol/L (ref 135–145)

## 2024-07-22 LAB — PROTIME-INR
INR: 1 (ref 0.8–1.2)
Prothrombin Time: 13.8 s (ref 11.4–15.2)

## 2024-07-22 MED ORDER — IOHEXOL 300 MG/ML  SOLN
100.0000 mL | Freq: Once | INTRAMUSCULAR | Status: AC | PRN
  Administered 2024-07-22: 100 mL via INTRAVENOUS

## 2024-07-22 MED ORDER — OXYCODONE HCL 5 MG PO TABS
5.0000 mg | ORAL_TABLET | Freq: Once | ORAL | Status: AC
  Administered 2024-07-22: 5 mg via ORAL
  Filled 2024-07-22: qty 1

## 2024-07-22 NOTE — ED Triage Notes (Addendum)
 Pt ambulatory to triage.  Pt states he fell going out the door at home 07/19/24.  Pt fell onto wood steps.  Pt has bruising to right side of abdomen and right upper arm.  Pt is on blood thinners.  No loc   no sob no chest pain .  Pt alert.  Speech clear.

## 2024-07-22 NOTE — ED Provider Notes (Signed)
 [facility] Provider Note    Event Date/Time   First MD Initiated Contact with Patient 07/22/24 1915     (approximate)   History   Fall   HPI  Jeremy Yoder is a 43 y.o. male PMH of CAD, hyperlipidemia, bipolar disorder, paroxysmal A-fib not on anticoagulation (despite with triage note states but rather he is on prasurgel) seizure disorder, hypertension who presents with right-sided abdominal pain after a fall.  Patient was in his normal state of health and tripped over the stairs at his father's house.  He hit his abdomen into the wearing.  Denies any head strike or headache.  Reports he noticed bruising to his right abdomen and has pain in that area and decided to come check out.  His fall was witnessed by his father who agrees with what the patient reports.  Patient has no chest pain or back pain or left-sided abdominal pain.      Physical Exam   Triage Vital Signs: ED Triage Vitals  Encounter Vitals Group     BP 07/22/24 1805 (!) 161/99     Girls Systolic BP Percentile --      Girls Diastolic BP Percentile --      Boys Systolic BP Percentile --      Boys Diastolic BP Percentile --      Pulse Rate 07/22/24 1805 71     Resp 07/22/24 1805 18     Temp 07/22/24 1805 97.8 F (36.6 C)     Temp Source 07/22/24 1805 Oral     SpO2 07/22/24 1805 100 %     Weight 07/22/24 1806 187 lb 6.3 oz (85 kg)     Height 07/22/24 1806 6' (1.829 m)     Head Circumference --      Peak Flow --      Pain Score 07/22/24 1805 6     Pain Loc --      Pain Education --      Exclude from Growth Chart --     Most recent vital signs: Vitals:   07/22/24 1805  BP: (!) 161/99  Pulse: 71  Resp: 18  Temp: 97.8 F (36.6 C)  SpO2: 100%    Nursing Triage Note reviewed. Vital signs reviewed and patients oxygen saturation is normoxic  General: Patient is well nourished, well developed, awake and alert, resting comfortably in no acute distress Head: Normocephalic and  atraumatic, no C-spine tenderness to palpation Eyes: Normal inspection, extraocular muscles intact, no conjunctival pallor Ear, nose, throat: Normal external exam Neck: Normal range of motion Respiratory: Patient is in no respiratory distress, lungs CTAB Cardiovascular: Patient is not tachycardic, RRR without murmur appreciated GI: Abd soft, tender to palpation in right upper quadrant where there is overlying ecchymosis but no rebound or guarding Back: Normal inspection of the back with good strength and range of motion throughout all ext Extremities: pulses intact with good cap refills, no LE pitting edema or calf tenderness Neuro: The patient is alert and oriented to person, place, and time, appropriately conversive, with 5/5 bilat UE/LE strength, no gross motor or sensory defects noted. Coordination appears to be adequate. Skin: Warm, dry, and intact Psych: normal mood and affect, no SI or HI  ED Results / Procedures / Treatments   Labs (all labs ordered are listed, but only abnormal results are displayed) Labs Reviewed  URINALYSIS, ROUTINE W REFLEX MICROSCOPIC - Abnormal; Notable for the following components:      Result Value  Color, Urine YELLOW (*)    APPearance CLEAR (*)    All other components within normal limits  HEPATIC FUNCTION PANEL - Abnormal; Notable for the following components:   Indirect Bilirubin 0.2 (*)    All other components within normal limits  BASIC METABOLIC PANEL WITH GFR  CBC  PROTIME-INR     EKG None  RADIOLOGY CT abd and pelvis with iv contrast: No acute abnormality on my independent review interpretation and radiologist agrees    PROCEDURES:  Critical Care performed: No  Procedures   MEDICATIONS ORDERED IN ED: Medications  oxyCODONE  (Oxy IR/ROXICODONE ) immediate release tablet 5 mg (5 mg Oral Given 07/22/24 1949)  iohexol  (OMNIPAQUE ) 300 MG/ML solution 100 mL (100 mLs Intravenous Contrast Given 07/22/24 2020)     IMPRESSION / MDM /  ASSESSMENT AND PLAN / ED COURSE                                Differential diagnosis includes, but is not limited to, abdominal wall contusion, liver laceration, acute anemia, electrolyte derangement   ED course: Patient arrives after a fall with right-sided abdominal pain.  He does have evidence of ecchymosis however abdominal exam is nonperitoneal thick.  Given that he is on Effient , I did consider the possibility of an intracranial hemorrhage however the patient adamantly does not want this and denies any headache head strike and I do think he has the capacity to make this decision.  He had no profound anemia, no electrolyte derangements no elevation of his liver function test.  CT abdomen pelvis demonstrated no acute abnormality.  He was given a oxycodone  with improvement in symptoms and feels comfortable returning home with his father who will drive him  Clinical Course as of 07/22/24 2347  Tue Jul 22, 2024  2044 Patient reexamined resting comfortably.  Reviewed the workup and he voiced understanding feels comfortable returning home.  Repeat abdominal exam benign [HD]    Clinical Course User Index [HD] Nicholaus Rolland BRAVO, MD   At time of discharge there is no evidence of acute life, limb, vision, or fertility threat. Patient has stable vital signs, pain is well controlled, patient is ambulatory and p.o. tolerant.  Discharge instructions were completed using the EPIC system. I would refer you to those at this time. All warnings prescriptions follow-up etc. were discussed in detail with the patient. Patient indicates understanding and is agreeable with this plan. All questions answered.  Patient is made aware that they may return to the emergency department for any worsening or new condition or for any other emergency.   -- Risk: 5 This patient has a high risk of morbidity due to further diagnostic testing or treatment. Rationale: This patient's evaluation and management involve a high  risk of morbidity due to the potential severity of presenting symptoms, need for diagnostic testing, and/or initiation of treatment that may require close monitoring. The differential includes conditions with potential for significant deterioration or requiring escalation of care. Treatment decisions in the ED, including medication administration, procedural interventions, or disposition planning, reflect this level of risk. COPA: 5 The patient has the following acute or chronic illness/injury that poses a possible threat to life or bodily function: [X] : The patient has a potentially serious acute condition or an acute exacerbation of a chronic illness requiring urgent evaluation and management in the Emergency Department. The clinical presentation necessitates immediate consideration of life-threatening or function-threatening diagnoses, even if they  are ultimately ruled out.   FINAL CLINICAL IMPRESSION(S) / ED DIAGNOSES   Final diagnoses:  Fall, initial encounter  Contusion of abdominal wall, initial encounter     Rx / DC Orders   ED Discharge Orders     None        Note:  This document was prepared using Dragon voice recognition software and may include unintentional dictation errors.   Nicholaus Rolland BRAVO, MD 07/22/24 [phone]

## 2024-07-22 NOTE — ED Triage Notes (Signed)
 First nurse note: pt to ED [facility] from home for multiple complaints. Possible fall 4 days ago. Bruising noted to chest. HTN.

## 2024-07-23 ENCOUNTER — Ambulatory Visit: Payer: Self-pay

## 2024-07-23 DIAGNOSIS — I480 Paroxysmal atrial fibrillation: Secondary | ICD-10-CM

## 2024-07-23 DIAGNOSIS — I479 Paroxysmal tachycardia, unspecified: Secondary | ICD-10-CM

## 2024-07-23 DIAGNOSIS — R079 Chest pain, unspecified: Secondary | ICD-10-CM | POA: Diagnosis not present

## 2024-08-05 ENCOUNTER — Ambulatory Visit

## 2024-08-18 ENCOUNTER — Ambulatory Visit: Attending: Medical

## 2024-08-18 DIAGNOSIS — R079 Chest pain, unspecified: Secondary | ICD-10-CM | POA: Diagnosis not present

## 2024-08-18 LAB — ECHOCARDIOGRAM COMPLETE
AR max vel: 3.4 cm2
AV Area VTI: 3.7 cm2
AV Area mean vel: 3.22 cm2
AV Mean grad: 4 mmHg
AV Peak grad: 7 mmHg
Ao pk vel: 1.32 m/s
Area-P 1/2: 3.91 cm2
S' Lateral: 3.4 cm

## 2024-08-25 ENCOUNTER — Emergency Department
Admission: EM | Admit: 2024-08-25 | Discharge: 2024-08-25 | Disposition: A | Discharge status: Home / Self Care | Attending: Emergency Medicine | Admitting: Emergency Medicine

## 2024-08-25 ENCOUNTER — Emergency Department

## 2024-08-25 ENCOUNTER — Encounter: Payer: Self-pay | Admitting: *Deleted

## 2024-08-25 ENCOUNTER — Other Ambulatory Visit: Payer: Self-pay

## 2024-08-25 DIAGNOSIS — I2510 Atherosclerotic heart disease of native coronary artery without angina pectoris: Secondary | ICD-10-CM | POA: Insufficient documentation

## 2024-08-25 DIAGNOSIS — R0789 Other chest pain: Secondary | ICD-10-CM | POA: Diagnosis present

## 2024-08-25 LAB — CBC
HCT: 47.6 % (ref 39.0–52.0)
Hemoglobin: 16.1 g/dL (ref 13.0–17.0)
MCH: 30.8 pg (ref 26.0–34.0)
MCHC: 33.8 g/dL (ref 30.0–36.0)
MCV: 91 fL (ref 80.0–100.0)
Platelets: 225 K/uL (ref 150–400)
RBC: 5.23 MIL/uL (ref 4.22–5.81)
RDW: 13.2 % (ref 11.5–15.5)
WBC: 9.5 K/uL (ref 4.0–10.5)
nRBC: 0 % (ref 0.0–0.2)

## 2024-08-25 LAB — BASIC METABOLIC PANEL WITH GFR
Anion gap: 12 (ref 5–15)
BUN: 11 mg/dL (ref 6–20)
CO2: 23 mmol/L (ref 22–32)
Calcium: 9.2 mg/dL (ref 8.9–10.3)
Chloride: 103 mmol/L (ref 98–111)
Creatinine, Ser: 0.8 mg/dL (ref 0.61–1.24)
GFR, Estimated: >60 mL/min
Glucose, Bld: 109 mg/dL — ABNORMAL HIGH (ref 70–99)
Potassium: 3.9 mmol/L (ref 3.5–5.1)
Sodium: 138 mmol/L (ref 135–145)

## 2024-08-25 LAB — PROTIME-INR
INR: 0.9 (ref 0.8–1.2)
Prothrombin Time: 12.2 s (ref 11.4–15.2)

## 2024-08-25 LAB — TROPONIN T, HIGH SENSITIVITY: Troponin T High Sensitivity: <15 ng/L (ref 0–19)

## 2024-08-25 MED ORDER — SUCRALFATE 1 G PO TABS: 1.0000 g | ORAL_TABLET | Freq: Four times a day (QID) | ORAL | 0 refills | Status: AC

## 2024-08-25 MED ORDER — OMEPRAZOLE MAGNESIUM 20 MG PO TBEC: 20.0000 mg | DELAYED_RELEASE_TABLET | Freq: Every day | ORAL | 1 refills | Status: AC

## 2024-08-25 NOTE — ED Triage Notes (Signed)
 Pt ambulating to triage.  Pt reports chest pain for 1 week.  Pt also has a cough .  No sob.  No n/v   pt alert  speech clear.

## 2024-08-25 NOTE — ED Provider Notes (Signed)
 "  [facility] Provider Note    Event Date/Time   First MD Initiated Contact with Patient 08/25/24 1947     (approximate)   History   Chest Pain   HPI  Jeremy Yoder is a 44 y.o. male past medical history significant for coronary artery disease, gastritis, who presents to the emergency department with chest pain.  States that he has been having chest pain for the past 1 week.  States that it feels like a burning sensation in the middle of the chest that is mostly present whenever he eats.  Denies any exertional chest pain.  Denies any shortness of breath.  No nausea or vomiting.  No diarrhea.  No significant NSAID use.  States that his chest pain has been improving and is only having some mild chest discomfort that feels like a burning sensation at this time.  States that he has been compliant with all of his medications.  Denies any alcohol use.  Does endorse tobacco use.  No other drug use.  On chart review patient had a recent cardiac catheterization with Dr. Darron, following an abnormal CT coronary study.     Physical Exam   Triage Vital Signs: ED Triage Vitals  Encounter Vitals Group     BP 08/25/24 1703 (!) 151/100     Girls Systolic BP Percentile --      Girls Diastolic BP Percentile --      Boys Systolic BP Percentile --      Boys Diastolic BP Percentile --      Pulse Rate 08/25/24 1703 81     Resp 08/25/24 1703 19     Temp 08/25/24 1703 98.2 F (36.8 C)     Temp Source 08/25/24 1703 Oral     SpO2 08/25/24 1703 98 %     Weight 08/25/24 1704 185 lb (83.9 kg)     Height 08/25/24 1704 6' (1.829 m)     Head Circumference --      Peak Flow --      Pain Score 08/25/24 1706 7     Pain Loc --      Pain Education --      Exclude from Growth Chart --     Most recent vital signs: Vitals:   08/25/24 1703 08/25/24 2001  BP: (!) 151/100 (!) 157/93  Pulse: 81 79  Resp: 19 18  Temp: 98.2 F (36.8 C)   SpO2: 98% 98%    Physical  Exam Constitutional:      Appearance: He is well-developed.  HENT:     Head: Atraumatic.  Eyes:     Conjunctiva/sclera: Conjunctivae normal.  Cardiovascular:     Rate and Rhythm: Regular rhythm.     Heart sounds: Normal heart sounds. No murmur heard. Pulmonary:     Effort: Pulmonary effort is normal. No respiratory distress.     Breath sounds: No wheezing.  Abdominal:     Palpations: Abdomen is soft.  Musculoskeletal:     Cervical back: Normal range of motion.     Right lower leg: No edema.     Left lower leg: No edema.  Skin:    General: Skin is warm.     Capillary Refill: Capillary refill takes less than 2 seconds.  Neurological:     Mental Status: He is alert. Mental status is at baseline.     IMPRESSION / MDM / ASSESSMENT AND PLAN / ED COURSE  I reviewed the triage vital signs and the  nursing notes.  Differential diagnoses including GERD/PUD, gastritis, ACS, dysrhythmia, electrolyte abnormality, esophagitis, vasospasm  EKG  I, Clotilda Punter, the attending physician, personally viewed and interpreted this ECG.  EKG showed normal sinus rhythm.  Normal intervals.  No chamber enlargement.  No significant ST elevation or depression.  No findings of acute ischemia or dysrhythmia.    RADIOLOGY Chest x-ray read as no acute findings LABS (all labs ordered are listed, but only abnormal results are displayed) Labs interpreted as -    Labs Reviewed  BASIC METABOLIC PANEL WITH GFR - Abnormal; Notable for the following components:      Result Value   Glucose, Bld 109 (*)    All other components within normal limits  CBC  PROTIME-INR  TROPONIN T, HIGH SENSITIVITY  TROPONIN T, HIGH SENSITIVITY     MDM  No significant leukocytosis or anemia.  Creatinine is at baseline with no significant electrolyte abnormality.  Troponin is undetectable.  Symptoms have been ongoing for the past 1 week.  Denies any significant chest discomfort at this time.  Clinical picture sounds  most consistent with peptic ulcer disease or gastritis given that it is worse with eating and feels like a burning sensation.  Recent cardiac catheterization with a drug-eluting stent and has been compliant with his home medications.  Discussed admission for further evaluation by cardiology given his multiple risk factors and recent drug-eluting stent that was placed for his known coronary artery disease, feel that the patient's symptoms are more consistent with GI symptoms with gastritis or peptic ulcer disease.  Patient states that he was to start a PPI and was given information to follow-up with gastroenterology.  He asked if gastroenterology would do a scope in the hospital and I do not feel that they would do an emergent scope at this time given I do not have any concern for perforated ulcer and do not have any obvious findings consistent with a significant GI bleed.  Patient without active chest pain at this time.  States that he would follow-up closely with his primary care physician and with his cardiologist and call tomorrow to schedule close follow-up appointment.  Feel that this is reasonable given his undetectable troponin and normal EKG.  Given information to follow-up with gastroenterology.  Discussed at length to return immediately to the emergency department if he had any return or worsening symptoms or develop new concerning symptoms.  No questions or concerns at time of discharge.  Start from a PPI.  Discussed food changes.     PROCEDURES:  Critical Care performed: No  Procedures  Patient's presentation is most consistent with acute presentation with potential threat to life or bodily function.   MEDICATIONS ORDERED IN ED: Medications - No data to display  FINAL CLINICAL IMPRESSION(S) / ED DIAGNOSES   Final diagnoses:  Atypical chest pain     Rx / DC Orders   ED Discharge Orders          Ordered    omeprazole  (PRILOSEC  OTC) 20 MG tablet  Daily        08/25/24 2002     sucralfate  (CARAFATE ) 1 g tablet  4 times daily        08/25/24 2003             Note:  This document was prepared using Dragon voice recognition software and may include unintentional dictation errors.   Punter Clotilda, MD 08/25/24 2334  "

## 2024-09-12 ENCOUNTER — Other Ambulatory Visit: Payer: Self-pay

## 2024-09-12 ENCOUNTER — Other Ambulatory Visit (HOSPITAL_COMMUNITY): Payer: Self-pay

## 2024-10-13 ENCOUNTER — Ambulatory Visit: Admitting: Medical
